# Patient Record
Sex: Female | Born: 1985 | Race: Black or African American | Hispanic: No | Marital: Married | Smoking: Never smoker
Health system: Southern US, Community
[De-identification: ages and names within clinical notes are randomized; demographics above are authoritative.]

## PROBLEM LIST (undated history)

## (undated) ENCOUNTER — Emergency Department (HOSPITAL_COMMUNITY): Admission: EM | Payer: Medicaid Other

## (undated) ENCOUNTER — Ambulatory Visit: Disposition: A | Discharge status: Home / Self Care

## (undated) DIAGNOSIS — I10 Essential (primary) hypertension: Secondary | ICD-10-CM

## (undated) DIAGNOSIS — G4730 Sleep apnea, unspecified: Secondary | ICD-10-CM

## (undated) DIAGNOSIS — E119 Type 2 diabetes mellitus without complications: Secondary | ICD-10-CM

## (undated) DIAGNOSIS — J45909 Unspecified asthma, uncomplicated: Secondary | ICD-10-CM

## (undated) DIAGNOSIS — D219 Benign neoplasm of connective and other soft tissue, unspecified: Secondary | ICD-10-CM

## (undated) DIAGNOSIS — Z5189 Encounter for other specified aftercare: Secondary | ICD-10-CM

## (undated) DIAGNOSIS — D509 Iron deficiency anemia, unspecified: Secondary | ICD-10-CM

## (undated) HISTORY — DX: Benign neoplasm of connective and other soft tissue, unspecified: D21.9

## (undated) HISTORY — PX: GALLBLADDER SURGERY: SHX652

## (undated) HISTORY — PX: CHOLECYSTECTOMY: SHX55

## (undated) HISTORY — PX: ABDOMINAL HYSTERECTOMY: SHX81

## (undated) HISTORY — DX: Sleep apnea, unspecified: G47.30

## (undated) HISTORY — DX: Unspecified asthma, uncomplicated: J45.909

## (undated) HISTORY — DX: Iron deficiency anemia, unspecified: D50.9

---
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
## 2004-04-02 ENCOUNTER — Inpatient Hospital Stay (HOSPITAL_COMMUNITY): Admission: AD | Admit: 2004-04-02 | Discharge: 2004-04-02 | Payer: Self-pay | Admitting: Obstetrics and Gynecology

## 2004-06-05 ENCOUNTER — Ambulatory Visit: Payer: Self-pay | Admitting: Obstetrics & Gynecology

## 2004-06-05 ENCOUNTER — Inpatient Hospital Stay (HOSPITAL_COMMUNITY): Admission: AD | Admit: 2004-06-05 | Discharge: 2004-06-06 | Payer: Self-pay | Admitting: Obstetrics & Gynecology

## 2004-06-25 ENCOUNTER — Ambulatory Visit (HOSPITAL_COMMUNITY): Admission: RE | Admit: 2004-06-25 | Discharge: 2004-06-25 | Payer: Self-pay | Admitting: *Deleted

## 2004-06-30 ENCOUNTER — Inpatient Hospital Stay (HOSPITAL_COMMUNITY): Admission: AD | Admit: 2004-06-30 | Discharge: 2004-06-30 | Payer: Self-pay | Admitting: Obstetrics and Gynecology

## 2004-07-02 ENCOUNTER — Inpatient Hospital Stay (HOSPITAL_COMMUNITY): Admission: RE | Admit: 2004-07-02 | Discharge: 2004-07-02 | Payer: Self-pay | Admitting: *Deleted

## 2004-07-04 ENCOUNTER — Ambulatory Visit: Payer: Self-pay | Admitting: Obstetrics & Gynecology

## 2004-07-04 ENCOUNTER — Inpatient Hospital Stay (HOSPITAL_COMMUNITY): Admission: AD | Admit: 2004-07-04 | Discharge: 2004-07-08 | Payer: Self-pay | Admitting: Obstetrics and Gynecology

## 2004-07-16 ENCOUNTER — Inpatient Hospital Stay (HOSPITAL_COMMUNITY): Admission: AD | Admit: 2004-07-16 | Discharge: 2004-07-16 | Payer: Self-pay | Admitting: *Deleted

## 2004-09-04 ENCOUNTER — Emergency Department (HOSPITAL_COMMUNITY): Admission: EM | Admit: 2004-09-04 | Discharge: 2004-09-04 | Payer: Self-pay | Admitting: *Deleted

## 2004-12-21 ENCOUNTER — Emergency Department (HOSPITAL_COMMUNITY): Admission: EM | Admit: 2004-12-21 | Discharge: 2004-12-21 | Payer: Self-pay | Admitting: Emergency Medicine

## 2005-01-12 ENCOUNTER — Emergency Department (HOSPITAL_COMMUNITY): Admission: EM | Admit: 2005-01-12 | Discharge: 2005-01-12 | Payer: Self-pay | Admitting: Emergency Medicine

## 2005-01-13 ENCOUNTER — Emergency Department (HOSPITAL_COMMUNITY): Admission: EM | Admit: 2005-01-13 | Discharge: 2005-01-13 | Payer: Self-pay | Admitting: Emergency Medicine

## 2010-03-06 ENCOUNTER — Encounter: Payer: Self-pay | Admitting: Emergency Medicine

## 2011-12-23 DIAGNOSIS — I10 Essential (primary) hypertension: Secondary | ICD-10-CM | POA: Insufficient documentation

## 2011-12-23 DIAGNOSIS — R102 Pelvic and perineal pain: Secondary | ICD-10-CM | POA: Insufficient documentation

## 2011-12-23 DIAGNOSIS — E1169 Type 2 diabetes mellitus with other specified complication: Secondary | ICD-10-CM | POA: Insufficient documentation

## 2012-05-11 DIAGNOSIS — F32A Depression, unspecified: Secondary | ICD-10-CM | POA: Insufficient documentation

## 2012-12-17 DIAGNOSIS — Z6841 Body Mass Index (BMI) 40.0 and over, adult: Secondary | ICD-10-CM | POA: Insufficient documentation

## 2012-12-17 DIAGNOSIS — G4733 Obstructive sleep apnea (adult) (pediatric): Secondary | ICD-10-CM | POA: Insufficient documentation

## 2013-05-16 DIAGNOSIS — M5136 Other intervertebral disc degeneration, lumbar region: Secondary | ICD-10-CM | POA: Insufficient documentation

## 2013-05-16 DIAGNOSIS — M51369 Other intervertebral disc degeneration, lumbar region without mention of lumbar back pain or lower extremity pain: Secondary | ICD-10-CM | POA: Insufficient documentation

## 2015-05-13 DIAGNOSIS — D539 Nutritional anemia, unspecified: Secondary | ICD-10-CM | POA: Insufficient documentation

## 2016-06-15 DIAGNOSIS — D259 Leiomyoma of uterus, unspecified: Secondary | ICD-10-CM | POA: Insufficient documentation

## 2017-02-14 DIAGNOSIS — Z5189 Encounter for other specified aftercare: Secondary | ICD-10-CM

## 2017-02-14 HISTORY — DX: Encounter for other specified aftercare: Z51.89

## 2017-09-12 ENCOUNTER — Other Ambulatory Visit: Payer: Self-pay

## 2017-09-12 ENCOUNTER — Emergency Department (HOSPITAL_COMMUNITY)
Admission: EM | Admit: 2017-09-12 | Discharge: 2017-09-12 | Disposition: A | Discharge status: Home / Self Care | Payer: Medicaid Other | Attending: Emergency Medicine | Admitting: Emergency Medicine

## 2017-09-12 ENCOUNTER — Encounter (HOSPITAL_COMMUNITY): Payer: Self-pay | Admitting: Emergency Medicine

## 2017-09-12 ENCOUNTER — Emergency Department (HOSPITAL_COMMUNITY): Payer: Medicaid Other

## 2017-09-12 DIAGNOSIS — R059 Cough, unspecified: Secondary | ICD-10-CM

## 2017-09-12 DIAGNOSIS — I10 Essential (primary) hypertension: Secondary | ICD-10-CM | POA: Insufficient documentation

## 2017-09-12 DIAGNOSIS — R05 Cough: Secondary | ICD-10-CM | POA: Insufficient documentation

## 2017-09-12 DIAGNOSIS — E119 Type 2 diabetes mellitus without complications: Secondary | ICD-10-CM | POA: Insufficient documentation

## 2017-09-12 DIAGNOSIS — R0602 Shortness of breath: Secondary | ICD-10-CM | POA: Insufficient documentation

## 2017-09-12 HISTORY — DX: Essential (primary) hypertension: I10

## 2017-09-12 HISTORY — DX: Type 2 diabetes mellitus without complications: E11.9

## 2017-09-12 MED ORDER — IPRATROPIUM-ALBUTEROL 0.5-2.5 (3) MG/3ML IN SOLN
3.0000 mL | Freq: Once | RESPIRATORY_TRACT | Status: AC
  Administered 2017-09-12: 3 mL via RESPIRATORY_TRACT
  Filled 2017-09-12: qty 3

## 2017-09-12 MED ORDER — BENZONATATE 100 MG PO CAPS: 100.0000 mg | ORAL_CAPSULE | Freq: Three times a day (TID) | ORAL | 0 refills | Status: DC

## 2017-09-12 NOTE — ED Triage Notes (Signed)
Pt states she has been coughing for a week and a half. Started feeling SOB, dizzy. Pt also states she has been tasting blood when she coughs.

## 2017-09-12 NOTE — ED Notes (Signed)
Pt transported to xray

## 2017-09-12 NOTE — ED Notes (Signed)
Pt verbalizes understanding of d/c instructions. Pt received prescriptions. Pt ambulatory at d/c with all belongings and with family.

## 2017-09-12 NOTE — ED Provider Notes (Signed)
[facility] EMERGENCY DEPARTMENT Provider Note   CSN: [id number] Arrival date & time: 09/12/17  1641    History   Chief Complaint Chief Complaint  Patient presents with   Cough   Shortness of Breath    HPI Elaine Perez is a 32 y.o. female.  HPI   32 year old female with a significant past medical history of asthma, diabetes and hypertension presents today with cough and shortness of breath.  Patient notes that over the last week she has had nonproductive cough and minor shortness of breath.  She notes chest tightness worse on the right side.  Patient notes no fever, nausea or vomiting, she is a non-smoker.  No history DVT or PE, no lower extremity swelling or edema or significant risk factors.   Past Medical History:  Diagnosis Date   Diabetes mellitus without complication ([place])    Hypertension     There are no active problems to display for this patient.   History reviewed. No pertinent surgical history.   OB History   None      Home Medications    Prior to Admission medications   Medication Sig Start Date End Date Taking? Authorizing Provider  benzonatate (TESSALON) 100 MG capsule Take 1 capsule (100 mg total) by mouth every 8 (eight) hours. 09/12/17   Okey Regal, PA-C    Family History History reviewed. No pertinent family history.  Social History Social History   Tobacco Use   Smoking status: Never Smoker   Smokeless tobacco: Never Used  Substance Use Topics   Alcohol use: Never    Frequency: Never   Drug use: Never     Allergies   Morphine and related   Review of Systems Review of Systems  All other systems reviewed and are negative.    Physical Exam Updated Vital Signs BP (!) 149/96 (BP Location: Left Arm)    Pulse 86    Temp 98.4 F (36.9 C) (Oral)    Resp 16    Ht 5\' 2"  (1.575 m)    Wt 104.8 kg (231 lb)    LMP 08/23/2017    SpO2 98%    BMI 42.25 kg/m   Physical Exam  Constitutional: She is oriented  to person, place, and time. She appears well-developed and well-nourished.  HENT:  Head: Normocephalic and atraumatic.  Eyes: Pupils are equal, round, and reactive to light. Conjunctivae are normal. Right eye exhibits no discharge. Left eye exhibits no discharge. No scleral icterus.  Neck: Normal range of motion. No JVD present. No tracheal deviation present.  Cardiovascular: Normal rate, regular rhythm, normal heart sounds and intact distal pulses.  Pulmonary/Chest: Effort normal and breath sounds normal. No stridor. No respiratory distress. She has no wheezes. She has no rales. She exhibits tenderness.  Minor tenderness to right anterior chest wall  Musculoskeletal:  No swelling or edema  Neurological: She is alert and oriented to person, place, and time. Coordination normal.  Psychiatric: She has a normal mood and affect. Her behavior is normal. Judgment and thought content normal.  Nursing note and vitals reviewed.    ED Treatments / Results  Labs (all labs ordered are listed, but only abnormal results are displayed) Labs Reviewed - No data to display  EKG EKG Interpretation  Date/Time:  Tuesday September 12 2017 16:59:25 EDT Ventricular Rate:  79 PR Interval:  106 QRS Duration: 74 QT Interval:  368 QTC Calculation: 421 R Axis:   37 Text Interpretation:  Sinus rhythm with short PR  Possible Anterior infarct , age undetermined Abnormal ECG No previous ECGs available Confirmed by Fredia Sorrow [phone]) on 09/13/2017 12:16:56 PM   Radiology Dg Chest 2 View  Result Date: 09/12/2017 CLINICAL DATA:  Cough and short of breath EXAM: CHEST - 2 VIEW COMPARISON:  None. FINDINGS: The heart size and mediastinal contours are within normal limits. Both lungs are clear. The visualized skeletal structures are unremarkable. IMPRESSION: No active cardiopulmonary disease. Electronically Signed   By: Franchot Gallo M.D.   On: 09/12/2017 17:46    Procedures Procedures (including critical care  time)  Medications Ordered in ED Medications  ipratropium-albuterol (DUONEB) 0.5-2.5 (3) MG/3ML nebulizer solution 3 mL (3 mLs Nebulization Given 09/12/17 1914)     Initial Impression / Assessment and Plan / ED Course  I have reviewed the triage vital signs and the nursing notes.  Pertinent labs & imaging results that were available during my care of the patient were reviewed by me and considered in my medical decision making (see chart for details).     32 year old female presents today with cough and shortness of breath.  Patient does have a history of asthma, she had a breathing treatment here which improved her symptoms although she remained with clear lung sounds throughout.  She has no signs of infectious etiology including pneumonia on exam, question asthma related symptoms.  Very low suspicion for ACS, PE, or any other acute intrathoracic abnormality.  Patient given outpatient follow-up strict return precautions.  She verbalized understanding and agreement to today's time.  Final Clinical Impressions(s) / ED Diagnoses   Final diagnoses:  Cough    ED Discharge Orders        Ordered    benzonatate (TESSALON) 100 MG capsule  Every 8 hours     09/12/17 2009       Okey Regal, PA-C 09/13/17 2140    Milton Ferguson, MD 09/16/17 [phone]

## 2017-09-12 NOTE — ED Notes (Signed)
Patient C/O her left lower arm being numb

## 2017-09-12 NOTE — Discharge Instructions (Addendum)
Please read attached information. If you experience any new or worsening signs or symptoms please return to the emergency room for evaluation. Please follow-up with your primary care provider or specialist as discussed. Please use medication prescribed only as directed and discontinue taking if you have any concerning signs or symptoms.   °

## 2017-09-12 NOTE — ED Provider Notes (Signed)
Patient placed in Quick Look pathway, seen and evaluated   Chief Complaint: sob, cough  HPI:   Pt states for the past week she has had sob and cough. Cough is nonproductive. She has associated sore throat. She states she occasionally feels hot, but denies reported fevers or chills. No cp. H/o asthma, mild improvement of sob with albuterol. No other medical problems. Takes no other medications. No known sick contacts.   ROS: sob  Physical Exam:   Gen: No distress  Neuro: Awake and Alert  Skin: Warm    Focused Exam: rrr. Lungs with scattered expiratory wheezes. Speaking in full sentences. No respiratory distress.    Initiation of care has begun. The patient has been counseled on the process, plan, and necessity for staying for the completion/evaluation, and the remainder of the medical screening examination    Franchot Heidelberg, PA-C 09/12/17 1711    Hayden Rasmussen, MD 09/13/17 1209

## 2018-03-20 DIAGNOSIS — E119 Type 2 diabetes mellitus without complications: Secondary | ICD-10-CM | POA: Diagnosis not present

## 2018-03-20 DIAGNOSIS — M791 Myalgia, unspecified site: Secondary | ICD-10-CM | POA: Diagnosis present

## 2018-03-20 DIAGNOSIS — J111 Influenza due to unidentified influenza virus with other respiratory manifestations: Secondary | ICD-10-CM | POA: Diagnosis not present

## 2018-03-20 DIAGNOSIS — I10 Essential (primary) hypertension: Secondary | ICD-10-CM | POA: Diagnosis not present

## 2018-03-21 ENCOUNTER — Emergency Department (HOSPITAL_BASED_OUTPATIENT_CLINIC_OR_DEPARTMENT_OTHER)
Admission: EM | Admit: 2018-03-21 | Discharge: 2018-03-21 | Disposition: A | Discharge status: Home / Self Care | Payer: Medicaid Other | Attending: Emergency Medicine | Admitting: Emergency Medicine

## 2018-03-21 ENCOUNTER — Encounter (HOSPITAL_BASED_OUTPATIENT_CLINIC_OR_DEPARTMENT_OTHER): Payer: Self-pay | Admitting: Emergency Medicine

## 2018-03-21 ENCOUNTER — Other Ambulatory Visit: Payer: Self-pay

## 2018-03-21 DIAGNOSIS — J111 Influenza due to unidentified influenza virus with other respiratory manifestations: Secondary | ICD-10-CM

## 2018-03-21 DIAGNOSIS — R69 Illness, unspecified: Secondary | ICD-10-CM

## 2018-03-21 NOTE — ED Triage Notes (Signed)
Pt having cold like symptoms for the past few days with generalized body aches and chills.

## 2018-03-21 NOTE — Discharge Instructions (Addendum)
You may alternate Tylenol 1000 mg every 6 hours as needed for fever and pain and ibuprofen 800 mg every 8 hours as needed for fever and pain. Please rest and drink plenty of fluids. This is a viral illness causing your symptoms. You do not need antibiotics for a virus. You may use over-the-counter nasal saline spray and Afrin nasal saline spray as needed for nasal congestion. Please do not use Afrin for more than 3 days in a row. You may use guaifenesin and dextromethorphan as needed for cough.  You may use lozenges and Chloraseptic spray to help with sore throat.  Warm salt water gargles can also help with sore throat.  You may use over-the-counter Unisom (doxyalamine) or Benadryl to help with sleep.  Please note that some combination medicines such as DayQuil and NyQuil have multiple medications in them.  Please make sure you look at all labels to ensure that you are not taking too much of any one particular medication.  Symptoms from a virus may take 7-14 days to run its course.  We do not test for the flu from the emergency department as it takes hours for this test to come back and it would not change our management. The flu is treated like any other virus with supportive measures as listed above. At this time you are outside the treatment window for Tamiflu. Tamiflu has to be taken within the first 48 hours of symptoms.  Tamiflu has many side effects including nausea, vomiting and diarrhea.   Steps to find a Primary Care Provider (PCP):  Call [phone] or [phone] to access "[facility]."  2.  You may also go on the [facility] website at [email or web address]  3.   and Wellness also frequently accepts new patients.  [place] [phone]  4.  There are also multiple [facility], [facility] and [facility] practices throughout the [place] that  are frequently accepting new patients. You may find a clinic that is close to your home and contact them.  [facility] [email or web address] [phone]  [facility] [email or web address]  [email or web address] [place] [email or web address] [phone]  5.  Local Health Departments also can provide primary care services.  [facility]  [place] [address] [phone]  [facility] [place] [address] [address] [place] [phone]

## 2018-03-21 NOTE — ED Provider Notes (Signed)
TIME SEEN: 2:09 AM  CHIEF COMPLAINT: Flulike illness  HPI: Patient is a 33 year old female with history of hypertension, diabetes, obesity who presents to the emergency department with 4 days of fevers, chills, body aches, nasal congestion, dry cough, sore throat.  No nausea, vomiting or diarrhea.  Did not have a flu shot this year.  No known sick contacts.  No recent travel.  ROS: See HPI Constitutional:  fever  Eyes: no drainage  ENT:  runny nose   Cardiovascular:  no chest pain  Resp: no SOB  GI: no vomiting GU: no dysuria Integumentary: no rash  Allergy: no hives  Musculoskeletal: no leg swelling  Neurological: no slurred speech ROS otherwise negative  PAST MEDICAL HISTORY/PAST SURGICAL HISTORY:  Past Medical History:  Diagnosis Date   Diabetes mellitus without complication ([place])    Hypertension     MEDICATIONS:  Prior to Admission medications   Medication Sig Start Date End Date Taking? Authorizing Provider  benzonatate (TESSALON) 100 MG capsule Take 1 capsule (100 mg total) by mouth every 8 (eight) hours. 09/12/17   Okey Regal, PA-C    ALLERGIES:  Allergies  Allergen Reactions   Morphine And Related     swelling    SOCIAL HISTORY:  Social History   Tobacco Use   Smoking status: Never Smoker   Smokeless tobacco: Never Used  Substance Use Topics   Alcohol use: Never    Frequency: Never    FAMILY HISTORY: No family history on file.  EXAM: BP (!) 157/96 (BP Location: Right Arm)    Pulse 70    Temp 98.4 F (36.9 C) (Oral)    Resp 18    Ht 5\' 1"  (1.549 m)    Wt 103.4 kg    SpO2 100%    BMI 43.08 kg/m  CONSTITUTIONAL: Alert and oriented and responds appropriately to questions. Well-appearing; well-nourished, obese, nontoxic HEAD: Normocephalic EYES: Conjunctivae clear, pupils appear equal, EOMI ENT: normal nose; moist mucous membranes; No pharyngeal erythema or petechiae, no tonsillar hypertrophy or exudate, no uvular deviation, no unilateral  swelling, no trismus or drooling, no muffled voice, patient does have a hoarse voice consistent with laryngitis, no stridor, no dental caries present, no drainable dental abscess noted, no Ludwig's angina, tongue sits flat in the bottom of the mouth, no angioedema, no facial erythema or warmth, no facial swelling; no pain with movement of the neck.  TMs are clear bilaterally without erythema, purulence, bulging, perforation, effusion.  No cerumen impaction or sign of foreign body in the external auditory canal. No inflammation, erythema or drainage from the external auditory canal. No signs of mastoiditis. No pain with manipulation of the pinna bilaterally.  NECK: Supple, no meningismus, no nuchal rigidity, no LAD  CARD: RRR; S1 and S2 appreciated; no murmurs, no clicks, no rubs, no gallops RESP: Normal chest excursion without splinting or tachypnea; breath sounds clear and equal bilaterally; no wheezes, no rhonchi, no rales, no hypoxia or respiratory distress, speaking full sentences ABD/GI: Normal bowel sounds; non-distended; soft, non-tender, no rebound, no guarding, no peritoneal signs, no hepatosplenomegaly BACK:  The back appears normal and is non-tender to palpation, there is no CVA tenderness EXT: Normal ROM in all joints; non-tender to palpation; no edema; normal capillary refill; no cyanosis, no calf tenderness or swelling    SKIN: Normal color for age and race; warm; no rash NEURO: Moves all extremities equally PSYCH: The patient's mood and manner are appropriate. Grooming and personal hygiene are appropriate.  MEDICAL DECISION MAKING:  Patient here with flulike symptoms.  Outside treatment window for Tamiflu.  Discussed supportive care instructions with over-the-counter medications, rest, increase fluid intake.  Will provide with work note.  She does not appear septic, toxic today.  Doubt meningitis, pneumonia, bacteremia.  Patient verbalized understanding.  At this time, I do not feel there  is any life-threatening condition present. I have reviewed and discussed all results (EKG, imaging, lab, urine as appropriate) and exam findings with patient/family. I have reviewed nursing notes and appropriate previous records.  I feel the patient is safe to be discharged home without further emergent workup and can continue workup as an outpatient as needed. Discussed usual and customary return precautions. Patient/family verbalize understanding and are comfortable with this plan.  Outpatient follow-up has been provided as needed. All questions have been answered.      Romilda Proby, Delice Bison, DO 03/21/18 [phone]

## 2018-03-21 NOTE — ED Notes (Signed)
ED Provider at bedside. °

## 2018-04-12 ENCOUNTER — Encounter (HOSPITAL_COMMUNITY): Payer: Self-pay | Admitting: Emergency Medicine

## 2018-04-12 ENCOUNTER — Other Ambulatory Visit: Payer: Self-pay

## 2018-04-12 ENCOUNTER — Emergency Department (HOSPITAL_COMMUNITY)
Admission: EM | Admit: 2018-04-12 | Discharge: 2018-04-12 | Disposition: A | Discharge status: Home / Self Care | Payer: Medicaid Other | Attending: Emergency Medicine | Admitting: Emergency Medicine

## 2018-04-12 DIAGNOSIS — D649 Anemia, unspecified: Secondary | ICD-10-CM

## 2018-04-12 DIAGNOSIS — R5383 Other fatigue: Secondary | ICD-10-CM | POA: Diagnosis not present

## 2018-04-12 DIAGNOSIS — I10 Essential (primary) hypertension: Secondary | ICD-10-CM | POA: Insufficient documentation

## 2018-04-12 DIAGNOSIS — R0602 Shortness of breath: Secondary | ICD-10-CM | POA: Diagnosis not present

## 2018-04-12 DIAGNOSIS — E119 Type 2 diabetes mellitus without complications: Secondary | ICD-10-CM | POA: Insufficient documentation

## 2018-04-12 LAB — ABO/RH: ABO/RH(D): B POS

## 2018-04-12 LAB — CBC WITH DIFFERENTIAL/PLATELET
Abs Immature Granulocytes: 0.02 10*3/uL (ref 0.00–0.07)
Basophils Absolute: 0.1 10*3/uL (ref 0.0–0.1)
Basophils Relative: 1 %
Eosinophils Absolute: 0.1 10*3/uL (ref 0.0–0.5)
Eosinophils Relative: 1 %
HCT: 29.3 % — ABNORMAL LOW (ref 36.0–46.0)
Hemoglobin: 7.4 g/dL — ABNORMAL LOW (ref 12.0–15.0)
Immature Granulocytes: 0 %
Lymphocytes Relative: 36 %
Lymphs Abs: 2.9 10*3/uL (ref 0.7–4.0)
MCH: 17.5 pg — ABNORMAL LOW (ref 26.0–34.0)
MCHC: 25.3 g/dL — ABNORMAL LOW (ref 30.0–36.0)
MCV: 69.1 fL — ABNORMAL LOW (ref 80.0–100.0)
Monocytes Absolute: 0.7 10*3/uL (ref 0.1–1.0)
Monocytes Relative: 8 %
Neutro Abs: 4.3 10*3/uL (ref 1.7–7.7)
Neutrophils Relative %: 54 %
Platelets: 542 10*3/uL — ABNORMAL HIGH (ref 150–400)
RBC: 4.24 MIL/uL (ref 3.87–5.11)
RDW: 19.5 % — ABNORMAL HIGH (ref 11.5–15.5)
WBC: 8 10*3/uL (ref 4.0–10.5)
nRBC: 0 % (ref 0.0–0.2)

## 2018-04-12 LAB — BASIC METABOLIC PANEL
Anion gap: 7 (ref 5–15)
BUN: 14 mg/dL (ref 6–20)
CO2: 23 mmol/L (ref 22–32)
Calcium: 9 mg/dL (ref 8.9–10.3)
Chloride: 109 mmol/L (ref 98–111)
Creatinine, Ser: 0.91 mg/dL (ref 0.44–1.00)
GFR calc Af Amer: >60 mL/min (ref >60)
GFR calc non Af Amer: >60 mL/min (ref >60)
Glucose, Bld: 99 mg/dL (ref 70–99)
Potassium: 4.2 mmol/L (ref 3.5–5.1)
Sodium: 139 mmol/L (ref 135–145)

## 2018-04-12 LAB — PREPARE RBC (CROSSMATCH)

## 2018-04-12 MED ORDER — SODIUM CHLORIDE 0.9% IV SOLUTION
Freq: Once | INTRAVENOUS | Status: AC
  Administered 2018-04-12: 10 mL/h via INTRAVENOUS

## 2018-04-12 NOTE — ED Notes (Signed)
Unable to obtain Iv access after 2 different RN's attempted , IV team consult placed

## 2018-04-12 NOTE — ED Notes (Signed)
Walked patient to the bathroom

## 2018-04-12 NOTE — ED Triage Notes (Signed)
Pt was told to come to the Ed for possible blood transfusion due to a low hgb , his of same

## 2018-04-12 NOTE — ED Provider Notes (Signed)
[facility] EMERGENCY DEPARTMENT Provider Note   CSN: [id number] Arrival date & time: 04/12/18  0846    History   Chief Complaint Chief Complaint  Patient presents with  . Abnormal Lab    HPI Elaine Perez is a 33 y.o. female.     HPI   32yF with anemia. Hx of the same. Called and told that was increasingly anemic and told that needed evaluated and possible transfusion. She reports hx of heavy periods. No acute change in this. Feels tired and SOB with exertion. Mild lightheadedness recently. No BRBPR or melena.   Past Medical History:  Diagnosis Date  . Diabetes mellitus without complication ([facility])   . Hypertension     There are no active problems to display for this patient.   No past surgical history on file.   OB History   No obstetric history on file.      Home Medications    Prior to Admission medications   Medication Sig Start Date End Date Taking? Authorizing Provider  benzonatate (TESSALON) 100 MG capsule Take 1 capsule (100 mg total) by mouth every 8 (eight) hours. 09/12/17   Eyvonne Mechanic, PA-C    Family History No family history on file.  Social History Social History   Tobacco Use  . Smoking status: Never Smoker  . Smokeless tobacco: Never Used  Substance Use Topics  . Alcohol use: Never    Frequency: Never  . Drug use: Never     Allergies   Morphine and related   Review of Systems Review of Systems All systems reviewed and negative, other than as noted in HPI.   Physical Exam Updated Vital Signs BP (!) 160/98 (BP Location: Right Arm) Comment: Simultaneous filing. User may not have seen previous data.  Pulse 94 Comment: Simultaneous filing. User may not have seen previous data.  Temp 98.5 F (36.9 C) (Oral) Comment: Simultaneous filing. User may not have seen previous data. Comment (Src): Simultaneous filing. User may not have seen previous data.  Resp 20 Comment: Simultaneous filing. User may not have seen  previous data.  SpO2 100% Comment: Simultaneous filing. User may not have seen previous data.  Physical Exam Vitals signs and nursing note reviewed.  Constitutional:      General: She is not in acute distress.    Appearance: She is well-developed. She is obese.  HENT:     Head: Normocephalic and atraumatic.  Eyes:     General:        Right eye: No discharge.        Left eye: No discharge.     Conjunctiva/sclera: Conjunctivae normal.  Neck:     Musculoskeletal: Neck supple.  Cardiovascular:     Rate and Rhythm: Normal rate and regular rhythm.     Heart sounds: Normal heart sounds. No murmur. No friction rub. No gallop.   Pulmonary:     Effort: Pulmonary effort is normal. No respiratory distress.     Breath sounds: Normal breath sounds.  Abdominal:     General: There is no distension.     Palpations: Abdomen is soft.     Tenderness: There is no abdominal tenderness.  Musculoskeletal:        General: No tenderness.  Skin:    General: Skin is warm and dry.  Neurological:     Mental Status: She is alert.  Psychiatric:        Behavior: Behavior normal.        Thought Content: Thought  content normal.      ED Treatments / Results  Labs (all labs ordered are listed, but only abnormal results are displayed) Labs Reviewed  CBC WITH DIFFERENTIAL/PLATELET - Abnormal; Notable for the following components:      Result Value   Hemoglobin 7.4 (*)    HCT 29.3 (*)    MCV 69.1 (*)    MCH 17.5 (*)    MCHC 25.3 (*)    RDW 19.5 (*)    Platelets 542 (*)    All other components within normal limits  BASIC METABOLIC PANEL  TYPE AND SCREEN  PREPARE RBC (CROSSMATCH)  ABO/RH    EKG None  Radiology No results found.  Procedures Procedures (including critical care time)  CRITICAL CARE Performed by: Raeford Razor Total critical care time: 35 minutes Critical care time was exclusive of separately billable procedures and treating other patients. Critical care was necessary to  treat or prevent imminent or life-threatening deterioration. Critical care was time spent personally by me on the following activities: development of treatment plan with patient and/or surrogate as well as nursing, discussions with consultants, evaluation of patient's response to treatment, examination of patient, obtaining history from patient or surrogate, ordering and performing treatments and interventions, ordering and review of laboratory studies, ordering and review of radiographic studies, pulse oximetry and re-evaluation of patient's condition.   Medications Ordered in ED Medications  0.9 %  sodium chloride infusion (Manually program via Guardrails IV Fluids) ( Intravenous Stopped 04/12/18 1859)     Initial Impression / Assessment and Plan / ED Course  I have reviewed the triage vital signs and the nursing notes.  Pertinent labs & imaging results that were available during my care of the patient were reviewed by me and considered in my medical decision making (see chart for details).        Symptomatic anemia. Hx of the same. Transfused 1u PRBC. Has established outpt care. Discussed return precautions.   Final Clinical Impressions(s) / ED Diagnoses   Final diagnoses:  Symptomatic anemia    ED Discharge Orders    None       Raeford Razor, MD 04/18/18 1039

## 2018-04-12 NOTE — ED Notes (Signed)
Patient verbalizes understanding of discharge instructions. Opportunity for questioning and answers were provided. Armband removed by staff, pt discharged from ED

## 2018-04-13 LAB — TYPE AND SCREEN
ABO/RH(D): B POS
Antibody Screen: NEGATIVE
Unit division: 0
Unit division: 0

## 2018-04-13 LAB — BPAM RBC
Blood Product Expiration Date: 202003062359
Blood Product Expiration Date: 202003242359
ISSUE DATE / TIME: 202002271155
ISSUE DATE / TIME: 202002271555
Unit Type and Rh: 1700
Unit Type and Rh: 1700

## 2018-04-17 ENCOUNTER — Telehealth: Payer: Self-pay | Admitting: Internal Medicine

## 2018-04-17 ENCOUNTER — Encounter: Payer: Self-pay | Admitting: Internal Medicine

## 2018-04-17 NOTE — Telephone Encounter (Signed)
A new hem appt has been scheduled for the pt to see Dr. Walden Field on 3/13 at 850am. Pt aware to arrive 30 minutes early. Letter mailed.

## 2018-04-27 ENCOUNTER — Telehealth: Payer: Self-pay | Admitting: *Deleted

## 2018-04-27 ENCOUNTER — Telehealth: Payer: Self-pay | Admitting: Internal Medicine

## 2018-04-27 ENCOUNTER — Encounter: Payer: Self-pay | Admitting: Internal Medicine

## 2018-04-27 ENCOUNTER — Inpatient Hospital Stay: Payer: Medicaid Other | Attending: Internal Medicine | Admitting: Internal Medicine

## 2018-04-27 ENCOUNTER — Other Ambulatory Visit: Payer: Self-pay

## 2018-04-27 ENCOUNTER — Inpatient Hospital Stay: Payer: Medicaid Other

## 2018-04-27 VITALS — BP 137/93 | HR 75 | Temp 98.9°F | Resp 20 | Ht 61.0 in | Wt 231.5 lb

## 2018-04-27 DIAGNOSIS — D509 Iron deficiency anemia, unspecified: Secondary | ICD-10-CM | POA: Insufficient documentation

## 2018-04-27 DIAGNOSIS — G629 Polyneuropathy, unspecified: Secondary | ICD-10-CM | POA: Insufficient documentation

## 2018-04-27 DIAGNOSIS — D500 Iron deficiency anemia secondary to blood loss (chronic): Secondary | ICD-10-CM

## 2018-04-27 DIAGNOSIS — D649 Anemia, unspecified: Secondary | ICD-10-CM

## 2018-04-27 DIAGNOSIS — I10 Essential (primary) hypertension: Secondary | ICD-10-CM | POA: Insufficient documentation

## 2018-04-27 DIAGNOSIS — D508 Other iron deficiency anemias: Secondary | ICD-10-CM

## 2018-04-27 DIAGNOSIS — N920 Excessive and frequent menstruation with regular cycle: Secondary | ICD-10-CM

## 2018-04-27 DIAGNOSIS — R5383 Other fatigue: Secondary | ICD-10-CM

## 2018-04-27 DIAGNOSIS — Z803 Family history of malignant neoplasm of breast: Secondary | ICD-10-CM

## 2018-04-27 HISTORY — DX: Iron deficiency anemia, unspecified: D50.9

## 2018-04-27 LAB — COMPREHENSIVE METABOLIC PANEL
ALT: 14 U/L (ref 0–44)
ANION GAP: 7 (ref 5–15)
AST: 14 U/L — ABNORMAL LOW (ref 15–41)
Albumin: 3.3 g/dL — ABNORMAL LOW (ref 3.5–5.0)
Alkaline Phosphatase: 64 U/L (ref 38–126)
BUN: 11 mg/dL (ref 6–20)
CO2: 24 mmol/L (ref 22–32)
Calcium: 8.9 mg/dL (ref 8.9–10.3)
Chloride: 107 mmol/L (ref 98–111)
Creatinine, Ser: 0.8 mg/dL (ref 0.44–1.00)
GFR calc Af Amer: >60 mL/min (ref >60)
GFR calc non Af Amer: >60 mL/min (ref >60)
GLUCOSE: 93 mg/dL (ref 70–99)
Potassium: 3.9 mmol/L (ref 3.5–5.1)
Sodium: 138 mmol/L (ref 135–145)
TOTAL PROTEIN: 7 g/dL (ref 6.5–8.1)

## 2018-04-27 LAB — CBC WITH DIFFERENTIAL/PLATELET
Abs Immature Granulocytes: 0.03 10*3/uL (ref 0.00–0.07)
Basophils Absolute: 0.1 10*3/uL (ref 0.0–0.1)
Basophils Relative: 1 %
Eosinophils Absolute: 0.2 10*3/uL (ref 0.0–0.5)
Eosinophils Relative: 2 %
HCT: 34.2 % — ABNORMAL LOW (ref 36.0–46.0)
Hemoglobin: 9.1 g/dL — ABNORMAL LOW (ref 12.0–15.0)
Immature Granulocytes: 0 %
Lymphocytes Relative: 35 %
Lymphs Abs: 2.9 10*3/uL (ref 0.7–4.0)
MCH: 19.7 pg — ABNORMAL LOW (ref 26.0–34.0)
MCHC: 26.6 g/dL — ABNORMAL LOW (ref 30.0–36.0)
MCV: 74 fL — AB (ref 80.0–100.0)
MONO ABS: 0.7 10*3/uL (ref 0.1–1.0)
Monocytes Relative: 9 %
Neutro Abs: 4.5 10*3/uL (ref 1.7–7.7)
Neutrophils Relative %: 53 %
PLATELETS: 459 10*3/uL — AB (ref 150–400)
RBC: 4.62 MIL/uL (ref 3.87–5.11)
RDW: 22.9 % — ABNORMAL HIGH (ref 11.5–15.5)
WBC: 8.4 10*3/uL (ref 4.0–10.5)
nRBC: 0 % (ref 0.0–0.2)

## 2018-04-27 LAB — VITAMIN B12: Vitamin B-12: 232 pg/mL (ref 180–914)

## 2018-04-27 LAB — LACTATE DEHYDROGENASE: LDH: 171 U/L (ref 98–192)

## 2018-04-27 LAB — FOLATE: Folate: 12 ng/mL (ref >5.9)

## 2018-04-27 LAB — FERRITIN: Ferritin: <4 ng/mL — ABNORMAL LOW (ref 11–307)

## 2018-04-27 NOTE — Telephone Encounter (Signed)
-----   Message from Zoila Shutter, MD sent at 04/27/2018 12:54 PM EDT ----- Notify pt iron levels are low and set up for IV iron.

## 2018-04-27 NOTE — Progress Notes (Signed)
Referring Physician:  Dallas Breeding, NP  [place]   Diagnosis Other iron deficiency anemia - Plan: CBC with Differential/Platelet, Comprehensive metabolic panel, Ferritin, Lactate dehydrogenase, Protein electrophoresis, serum, Hemoglobinopathy evaluation, Vitamin B12, Folate, Methylmalonic acid, serum, Haptoglobin, T4 AND TSH  Fatigue associated with anemia - Plan: CBC with Differential/Platelet, Comprehensive metabolic panel, Ferritin, Lactate dehydrogenase, Protein electrophoresis, serum, Hemoglobinopathy evaluation, Vitamin B12, Folate, Methylmalonic acid, serum, Haptoglobin, T4 AND TSH  Staging Cancer Staging No matching staging information was found for the patient.  Assessment and Plan:  1.  Iron deficiency anemia (IDA).   33 year old female referred for evaluation due to persistent iron deficiency anemia.  Pt has an intolerance for oral iron.  She has been told she has uterine fibroids.  She has a family history of various cancers.  She reports her menstrual cycles occur 2 times a month.  Labs done 04/12/2018 showed WBC 8 HB 7.4 plts 542,000.  MCV 69.  Chemistries WNL with K+ 4.2 Cr 0.9.  Pt was transfused on 04/12/2018.  She reports craving ice.  She reports fatigue and numbness in hands.  Pt is seen today for consultation due to Iron deficiency anemia.    Labs done 04/27/2018 reviewed and showed WBC 8.4 HB 9.1 plts 459,000.  MCV 74.  Chemistries WNL with K+ 3.9 Cr 0.80 and normal LFTs.  Ferritin < 4.  Folate 12 and B12 232.    Pt has an intolerance to oral iron.  She is recommended for Feraheme 510 mg IV D1 and D8. Side effects of medication reviewed and include potential allergic reaction.  Pt will be set up for IV iron and will RTC 6 weeks after IV treatment for repeat labs and follow-up.    2.  Menorrhagia.  Likely due to uterine fibroids.  Pt is encouraged to follow-up with GYN.  Likely etiology of IDA.    3.  Hypertension.  BP is 137/93.  Follow-up with PCP.    4.   Fatigue.  Likely due to IDA.  Will check thyroid function tests and determine if symptoms improve with therapy.    5.  Neuropathy.  Pt has reported DM.  Will determine if symptoms improve with IV iron.    6.  Health maintenance.  Pt will be referred to GI on RTC in 05/2018.    40 minutes spent with more than 50% spent in review of records, counseling and coordination of care.     HPI:  33 year old female referred for evaluation due to persistent iron deficiency anemia.  Pt was seen at [facility] and was treated in the past with IV iron. Pt has an intolerance for oral iron.  She has been told she has uterine fibroids.  She has a family history of various cancers.  She reports her menstrual cycles occur 2 times a month.  Labs done 04/12/2018 showed WBC 8 HB 7.4 plts 542,000.  MCV 69.  Chemistries WNL with K+ 4.2 Cr 0.9.  Pt was transfused on 04/12/2018.  She reports craving ice.  She reports fatigue and numbness in hands.  Pt is seen today for consultation due to Iron deficiency anemia.    Problem List Iron deficiency anemia  Past Medical History Past Medical History:  Diagnosis Date   Diabetes mellitus without complication ([place])    Fibroids    Hypertension     Past Surgical History Past Surgical History:  Procedure Laterality Date   GALLBLADDER SURGERY  REPEAT CESAREAN SECTION      Family History Family History  Problem Relation Age of Onset   Breast cancer Other    Lupus Other      Social History  reports that she has never smoked. She has never used smokeless tobacco. She reports that she does not drink alcohol or use drugs.  Medications  Current Outpatient Medications:    albuterol (VENTOLIN HFA) 108 (90 Base) MCG/ACT inhaler, Inhale 2 puffs into the lungs as needed., Disp: , Rfl:    benzonatate (TESSALON) 100 MG capsule, Take 1 capsule (100 mg total) by mouth every 8 (eight) hours. (Patient not taking: Reported on 04/12/2018), Disp: 21 capsule,  Rfl: 0  Allergies Morphine and related  Review of Systems Review of Systems - Oncology ROS negative other than fatigue and neuropathy   Physical Exam  Vitals Wt Readings from Last 3 Encounters:  04/27/18 231 lb 8 oz (105 kg)  04/12/18 230 lb (104.3 kg)  03/21/18 228 lb (103.4 kg)   Temp Readings from Last 3 Encounters:  04/27/18 98.9 F (37.2 C) (Oral)  04/12/18 99 F (37.2 C) (Oral)  03/21/18 98.4 F (36.9 C) (Oral)   BP Readings from Last 3 Encounters:  04/27/18 (!) 137/93  04/12/18 133/88  03/21/18 (!) 157/96   Pulse Readings from Last 3 Encounters:  04/27/18 75  04/12/18 71  03/21/18 70   Constitutional: Well-developed, well-nourished, and in no distress.   HENT: Head: Normocephalic and atraumatic.  Mouth/Throat: No oropharyngeal exudate. Mucosa moist. Eyes: Pupils are equal, round, and reactive to light. Conjunctivae are normal. No scleral icterus.  Neck: Normal range of motion. Neck supple. No JVD present.  Cardiovascular: Normal rate, regular rhythm and normal heart sounds.  Exam reveals no gallop and no friction rub.   No murmur heard. Pulmonary/Chest: Effort normal and breath sounds normal. No respiratory distress. No wheezes.No rales.  Abdominal: Soft. Bowel sounds are normal. No distension. There is no tenderness. There is no guarding.  Musculoskeletal: No edema or tenderness.  Lymphadenopathy: No cervical,axillary or supraclavicular adenopathy.  Neurological: Alert and oriented to person, place, and time. No cranial nerve deficit.  Skin: Skin is warm and dry. No rash noted. No erythema. No pallor.  Psychiatric: Affect and judgment normal.   Labs Appointment on 04/27/2018  Component Date Value Ref Range Status   Folate 04/27/2018 12.0  >5.9 ng/mL Final   Performed at [facility], [place] [address], [place], [place] [address]   Vitamin B-12 04/27/2018 232  180 - 914 pg/mL Final   Comment: (NOTE) This assay is not validated for  testing neonatal or myeloproliferative syndrome specimens for Vitamin B12 levels. Performed at [facility], [place] [address], [place], [place] [address]    LDH 04/27/2018 171  98 - 192 U/L Final   Performed at [facility], [place] [address], [place], [place] [address]   Ferritin 04/27/2018 <4* 11 - 307 ng/mL Final   Performed at [facility], [place] [address], [place], [place] [address]   Sodium 04/27/2018 138  135 - 145 mmol/L Final   Potassium 04/27/2018 3.9  3.5 - 5.1 mmol/L Final   Chloride 04/27/2018 107  98 - 111 mmol/L Final   CO2 04/27/2018 24  22 - 32 mmol/L Final   Glucose, Bld 04/27/2018 93  70 - 99 mg/dL Final   BUN 04/27/2018 11  6 - 20 mg/dL Final   Creatinine, Ser 04/27/2018 0.80  0.44 -  1.00 mg/dL Final   Calcium 04/27/2018 8.9  8.9 - 10.3 mg/dL Final   Total Protein 04/27/2018 7.0  6.5 - 8.1 g/dL Final   Albumin 04/27/2018 3.3* 3.5 - 5.0 g/dL Final   AST 04/27/2018 14* 15 - 41 U/L Final   ALT 04/27/2018 14  0 - 44 U/L Final   Alkaline Phosphatase 04/27/2018 64  38 - 126 U/L Final   Total Bilirubin 04/27/2018 <0.2* 0.3 - 1.2 mg/dL Final   GFR calc non Af Amer 04/27/2018 >60  >60 mL/min Final   GFR calc Af Amer 04/27/2018 >60  >60 mL/min Final   Anion gap 04/27/2018 7  5 - 15 Final   Performed at [facility], [place] [address], [place], [place] [address]   WBC 04/27/2018 8.4  4.0 - 10.5 K/uL Final   RBC 04/27/2018 4.62  3.87 - 5.11 MIL/uL Final   Hemoglobin 04/27/2018 9.1* 12.0 - 15.0 g/dL Final   Comment: Reticulocyte Hemoglobin testing may be clinically indicated, consider ordering this additional test [id number]    HCT 04/27/2018 34.2* 36.0 - 46.0 % Final   MCV 04/27/2018 74.0* 80.0 - 100.0 fL Final   MCH 04/27/2018 19.7* 26.0 - 34.0 pg Final   MCHC 04/27/2018 26.6* 30.0 - 36.0 g/dL Final   RDW 04/27/2018 22.9* 11.5 - 15.5 % Final   Platelets  04/27/2018 459* 150 - 400 K/uL Final   nRBC 04/27/2018 0.0  0.0 - 0.2 % Final   Neutrophils Relative % 04/27/2018 53  % Final   Neutro Abs 04/27/2018 4.5  1.7 - 7.7 K/uL Final   Lymphocytes Relative 04/27/2018 35  % Final   Lymphs Abs 04/27/2018 2.9  0.7 - 4.0 K/uL Final   Monocytes Relative 04/27/2018 9  % Final   Monocytes Absolute 04/27/2018 0.7  0.1 - 1.0 K/uL Final   Eosinophils Relative 04/27/2018 2  % Final   Eosinophils Absolute 04/27/2018 0.2  0.0 - 0.5 K/uL Final   Basophils Relative 04/27/2018 1  % Final   Basophils Absolute 04/27/2018 0.1  0.0 - 0.1 K/uL Final   Immature Granulocytes 04/27/2018 0  % Final   Abs Immature Granulocytes 04/27/2018 0.03  0.00 - 0.07 K/uL Final   Performed at [facility], [place] [address] [place], [place] [address]     Pathology Orders Placed This Encounter  Procedures   CBC with Differential/Platelet    Standing Status:   Future    Number of Occurrences:   1    Standing Expiration Date:   04/27/2019   Comprehensive metabolic panel    Standing Status:   Future    Number of Occurrences:   1    Standing Expiration Date:   04/27/2019   Ferritin    Standing Status:   Future    Number of Occurrences:   1    Standing Expiration Date:   04/27/2019   Lactate dehydrogenase    Standing Status:   Future    Number of Occurrences:   1    Standing Expiration Date:   04/27/2019   Protein electrophoresis, serum    Standing Status:   Future    Number of Occurrences:   1    Standing Expiration Date:   04/27/2019   Hemoglobinopathy evaluation    Standing Status:   Future    Number of Occurrences:   1    Standing Expiration Date:   04/27/2019   Vitamin B12    Standing Status:  Future    Number of Occurrences:   1    Standing Expiration Date:   04/27/2019   Folate    Standing Status:   Future    Number of Occurrences:   1    Standing Expiration Date:   04/27/2019   Methylmalonic acid, serum    Standing  Status:   Future    Number of Occurrences:   1    Standing Expiration Date:   04/27/2019   Haptoglobin    Standing Status:   Future    Number of Occurrences:   1    Standing Expiration Date:   04/27/2019   T4 AND TSH    Standing Status:   Future    Number of Occurrences:   1    Standing Expiration Date:   04/27/2019       Zoila Shutter MD

## 2018-04-27 NOTE — Telephone Encounter (Signed)
Scheduled appt per 3/13 los. ° °Printed calendar and avs. °

## 2018-04-27 NOTE — Telephone Encounter (Signed)
TCT patient regarding lab results. Pt's ferritin is low and will need IV iron x 2 per Dr. Walden Field. Spoke with pt and made her aware of this. Advised that a scheduler will be calling to set up those infusions.  Pt voiced understanding.

## 2018-04-27 NOTE — Telephone Encounter (Signed)
Scheduled appt per 3/13 sch message - pt aware of appt date and time

## 2018-04-28 LAB — HAPTOGLOBIN: Haptoglobin: 148 mg/dL (ref 33–278)

## 2018-04-30 LAB — PROTEIN ELECTROPHORESIS, SERUM
A/G Ratio: 1.1 (ref 0.7–1.7)
Albumin ELP: 3.4 g/dL (ref 2.9–4.4)
Alpha-1-Globulin: 0.2 g/dL (ref 0.0–0.4)
Alpha-2-Globulin: 0.7 g/dL (ref 0.4–1.0)
Beta Globulin: 0.9 g/dL (ref 0.7–1.3)
Gamma Globulin: 1.3 g/dL (ref 0.4–1.8)
Globulin, Total: 3.1 g/dL (ref 2.2–3.9)
Total Protein ELP: 6.5 g/dL (ref 6.0–8.5)

## 2018-04-30 LAB — METHYLMALONIC ACID, SERUM: Methylmalonic Acid, Quantitative: 131 nmol/L (ref 0–378)

## 2018-05-01 LAB — HEMOGLOBINOPATHY EVALUATION
Hgb A2 Quant: 1.9 % (ref 1.8–3.2)
Hgb A: 98.1 % (ref 96.4–98.8)
Hgb C: 0 %
Hgb F Quant: 0 % (ref 0.0–2.0)
Hgb S Quant: 0 %
Hgb Variant: 0 %

## 2018-05-04 ENCOUNTER — Other Ambulatory Visit: Payer: Self-pay

## 2018-05-04 ENCOUNTER — Inpatient Hospital Stay: Payer: Medicaid Other

## 2018-05-04 VITALS — BP 135/1 | HR 78 | Temp 99.3°F | Resp 16 | Ht 61.0 in | Wt 231.0 lb

## 2018-05-04 DIAGNOSIS — D509 Iron deficiency anemia, unspecified: Secondary | ICD-10-CM | POA: Diagnosis not present

## 2018-05-04 DIAGNOSIS — D500 Iron deficiency anemia secondary to blood loss (chronic): Secondary | ICD-10-CM

## 2018-05-04 MED ORDER — SODIUM CHLORIDE 0.9 % IV SOLN
Freq: Once | INTRAVENOUS | Status: AC
  Administered 2018-05-04: 09:00:00 via INTRAVENOUS
  Filled 2018-05-04: qty 250

## 2018-05-04 MED ORDER — SODIUM CHLORIDE 0.9 % IV SOLN
510.0000 mg | Freq: Once | INTRAVENOUS | Status: AC
  Administered 2018-05-04: 510 mg via INTRAVENOUS
  Filled 2018-05-04: qty 17

## 2018-05-04 NOTE — Patient Instructions (Signed)

## 2018-05-11 ENCOUNTER — Ambulatory Visit: Payer: Medicaid Other

## 2018-05-23 ENCOUNTER — Telehealth: Payer: Self-pay | Admitting: Internal Medicine

## 2018-05-23 NOTE — Telephone Encounter (Signed)
Confirmed 4/13 appointment with patient. Rescheduled from 3/27 per patient.

## 2018-05-28 ENCOUNTER — Telehealth: Payer: Self-pay | Admitting: *Deleted

## 2018-05-28 ENCOUNTER — Inpatient Hospital Stay: Payer: Medicaid Other

## 2018-05-28 NOTE — Telephone Encounter (Signed)
-----   Message from [facility] sent at 05/28/2018 12:35 PM EDT ----- Regarding: Elaine Perez Perez called and rescheduled her iron infusion from today to this Friday - she had some questions regarding her side effects after getting in the infusion.   Can you please give her a call? Thanks (:

## 2018-05-28 NOTE — Telephone Encounter (Signed)
Attempted to call patient back regarding potential side effects from IV iron infusion. Pt last had IV iron on3/20/20 and is scheduled again for 06/01/18. No answer but was able to leave vm message for pt to call back @ her convenience before 4:30 pm

## 2018-06-01 ENCOUNTER — Other Ambulatory Visit: Payer: Self-pay

## 2018-06-01 ENCOUNTER — Inpatient Hospital Stay: Payer: Medicaid Other | Attending: Internal Medicine

## 2018-06-01 VITALS — BP 148/92 | HR 67 | Temp 97.8°F | Resp 16

## 2018-06-01 DIAGNOSIS — D509 Iron deficiency anemia, unspecified: Secondary | ICD-10-CM | POA: Insufficient documentation

## 2018-06-01 DIAGNOSIS — D500 Iron deficiency anemia secondary to blood loss (chronic): Secondary | ICD-10-CM

## 2018-06-01 MED ORDER — SODIUM CHLORIDE 0.9 % IV SOLN
510.0000 mg | Freq: Once | INTRAVENOUS | Status: AC
  Administered 2018-06-01: 510 mg via INTRAVENOUS
  Filled 2018-06-01: qty 17

## 2018-06-01 MED ORDER — SODIUM CHLORIDE 0.9 % IV SOLN
Freq: Once | INTRAVENOUS | Status: AC
  Administered 2018-06-01: 09:00:00 via INTRAVENOUS
  Filled 2018-06-01: qty 250

## 2018-06-01 NOTE — Patient Instructions (Signed)

## 2018-06-14 ENCOUNTER — Inpatient Hospital Stay: Payer: Medicaid Other

## 2018-06-14 ENCOUNTER — Other Ambulatory Visit: Payer: Self-pay

## 2018-06-14 DIAGNOSIS — D508 Other iron deficiency anemias: Secondary | ICD-10-CM

## 2018-06-14 DIAGNOSIS — D509 Iron deficiency anemia, unspecified: Secondary | ICD-10-CM | POA: Diagnosis not present

## 2018-06-14 LAB — CMP (CANCER CENTER ONLY)
ALT: 16 U/L (ref 0–44)
AST: 13 U/L — ABNORMAL LOW (ref 15–41)
Albumin: 3.4 g/dL — ABNORMAL LOW (ref 3.5–5.0)
Alkaline Phosphatase: 65 U/L (ref 38–126)
Anion gap: 9 (ref 5–15)
BUN: 12 mg/dL (ref 6–20)
CO2: 26 mmol/L (ref 22–32)
Calcium: 9.1 mg/dL (ref 8.9–10.3)
Chloride: 107 mmol/L (ref 98–111)
Creatinine: 0.83 mg/dL (ref 0.44–1.00)
GFR, Est AFR Am: >60 mL/min (ref >60)
GFR, Estimated: >60 mL/min (ref >60)
Glucose, Bld: 70 mg/dL (ref 70–99)
Potassium: 4 mmol/L (ref 3.5–5.1)
Sodium: 142 mmol/L (ref 135–145)
Total Bilirubin: <0.2 mg/dL — ABNORMAL LOW (ref 0.3–1.2)
Total Protein: 7.1 g/dL (ref 6.5–8.1)

## 2018-06-14 LAB — CBC WITH DIFFERENTIAL (CANCER CENTER ONLY)
Abs Immature Granulocytes: 0.04 10*3/uL (ref 0.00–0.07)
Basophils Absolute: 0.1 10*3/uL (ref 0.0–0.1)
Basophils Relative: 1 %
Eosinophils Absolute: 0.3 10*3/uL (ref 0.0–0.5)
Eosinophils Relative: 3 %
HCT: 36.4 % (ref 36.0–46.0)
Hemoglobin: 10.5 g/dL — ABNORMAL LOW (ref 12.0–15.0)
Immature Granulocytes: 0 %
Lymphocytes Relative: 36 %
Lymphs Abs: 3.3 10*3/uL (ref 0.7–4.0)
MCH: 23.8 pg — ABNORMAL LOW (ref 26.0–34.0)
MCHC: 28.8 g/dL — ABNORMAL LOW (ref 30.0–36.0)
MCV: 82.4 fL (ref 80.0–100.0)
Monocytes Absolute: 0.8 10*3/uL (ref 0.1–1.0)
Monocytes Relative: 9 %
Neutro Abs: 4.7 10*3/uL (ref 1.7–7.7)
Neutrophils Relative %: 51 %
Platelet Count: 395 10*3/uL (ref 150–400)
RBC: 4.42 MIL/uL (ref 3.87–5.11)
RDW: 23.7 % — ABNORMAL HIGH (ref 11.5–15.5)
WBC Count: 9.2 10*3/uL (ref 4.0–10.5)
nRBC: 0 % (ref 0.0–0.2)

## 2018-06-14 LAB — LACTATE DEHYDROGENASE: LDH: 154 U/L (ref 98–192)

## 2018-06-14 LAB — FERRITIN: Ferritin: 109 ng/mL (ref 11–307)

## 2018-06-15 ENCOUNTER — Inpatient Hospital Stay: Payer: Medicaid Other | Attending: Internal Medicine | Admitting: Internal Medicine

## 2018-06-15 DIAGNOSIS — D500 Iron deficiency anemia secondary to blood loss (chronic): Secondary | ICD-10-CM

## 2018-06-15 NOTE — Progress Notes (Signed)
Virtual Visit via Telephone Note  I connected with Elaine Perez on 06/15/18 at  8:50 AM EDT by telephone and verified that I am speaking with the correct person using two identifiers.   I discussed the limitations, risks, security and privacy concerns of performing an evaluation and management service by telephone and the availability of in person appointments. I also discussed with the patient that there may be a patient responsible charge related to this service. The patient expressed understanding and agreed to proceed.  Interval History:  Historical data obtained from note dated 04/27/2018.  33 year old female referred for evaluation due to persistent iron deficiency anemia.  Pt was seen at [facility] and was treated in the past with IV iron. Pt has an intolerance for oral iron.  She has been told she has uterine fibroids.  She has a family history of various cancers.  She reports her menstrual cycles occur 2 times a month.  Labs done 04/12/2018 showed WBC 8 HB 7.4 plts 542,000.  MCV 69.  Chemistries WNL with K+ 4.2 Cr 0.9.  Pt was transfused on 04/12/2018.  She reports craving ice.  She reports fatigue and numbness in hands.    Observations/Objective: Review of labs from 06/14/2018  Assessment and Plan:  1.  Iron deficiency anemia (IDA).   33 year old female referred for evaluation due to persistent iron deficiency anemia.  Pt has an intolerance for oral iron.  She has been told she has uterine fibroids.  She has a family history of various cancers.  She reports her menstrual cycles occur 2 times a month.  Labs done 04/12/2018 showed WBC 8 HB 7.4 plts 542,000.  MCV 69.  Chemistries WNL with K+ 4.2 Cr 0.9.  Pt was transfused on 04/12/2018.  She reports craving ice.  She reports fatigue and numbness in hands.    Labs done 04/27/2018 reviewed and showed WBC 8.4 HB 9.1 plts 459,000.  MCV 74.  Chemistries WNL with K+ 3.9 Cr 0.80 and normal LFTs.  Ferritin < 4.  Folate 12 and B12 232.    Pt  was treated with Feraheme 05/04/2018 and 06/01/2018.    Labs done 06/14/2018 reviewed and showed WBC 9.2 HB 10.5 plts 395,000.  Chemistries WNL with K+ 4 Cr 0.83 and normal LFTs.  Ferritin is improved at 109.  Hb is improved at 10.5.  Pt will RTC in 08/2018 with repeat labs.   She is advised to follow-up with GYN if ongoing menorrhagia.    2.  Menorrhagia.  Likely due to uterine fibroids.  Pt is encouraged to follow-up with GYN.  Likely etiology of IDA.    3.  Hypertension.  BP was 137/93.  Follow-up with PCP.   4.  Neuropathy.  Pt has reported DM.  Follow-up with PCP if ongoing symptoms.    5.  Health maintenance.  Will consider GI referral if counts fail to continue to improve.    Follow Up Instructions: Follow-up 08/2018 with labs.      I discussed the assessment and treatment plan with the patient. The patient was provided an opportunity to ask questions and all were answered. The patient agreed with the plan and demonstrated an understanding of the instructions.   The patient was advised to call back or seek an in-person evaluation if the symptoms worsen or if the condition fails to improve as anticipated.  I provided 15 minutes of non-face-to-face time during this encounter.   Ahmed Prima, MD

## 2018-06-18 ENCOUNTER — Telehealth: Payer: Self-pay | Admitting: Internal Medicine

## 2018-06-18 NOTE — Telephone Encounter (Signed)
Called regarding schedule °

## 2018-08-22 ENCOUNTER — Inpatient Hospital Stay: Payer: Medicaid Other | Admitting: Internal Medicine

## 2018-08-22 ENCOUNTER — Inpatient Hospital Stay: Payer: Medicaid Other | Attending: Internal Medicine

## 2018-08-22 DIAGNOSIS — I10 Essential (primary) hypertension: Secondary | ICD-10-CM | POA: Insufficient documentation

## 2018-08-22 DIAGNOSIS — N920 Excessive and frequent menstruation with regular cycle: Secondary | ICD-10-CM | POA: Insufficient documentation

## 2018-08-22 DIAGNOSIS — E1140 Type 2 diabetes mellitus with diabetic neuropathy, unspecified: Secondary | ICD-10-CM | POA: Insufficient documentation

## 2018-08-22 DIAGNOSIS — D509 Iron deficiency anemia, unspecified: Secondary | ICD-10-CM | POA: Insufficient documentation

## 2018-08-31 ENCOUNTER — Inpatient Hospital Stay (HOSPITAL_BASED_OUTPATIENT_CLINIC_OR_DEPARTMENT_OTHER): Payer: Medicaid Other | Admitting: Internal Medicine

## 2018-08-31 ENCOUNTER — Encounter: Payer: Self-pay | Admitting: Gastroenterology

## 2018-08-31 ENCOUNTER — Other Ambulatory Visit: Payer: Self-pay

## 2018-08-31 ENCOUNTER — Inpatient Hospital Stay: Payer: Medicaid Other

## 2018-08-31 VITALS — BP 142/87 | HR 87 | Temp 98.9°F | Resp 18 | Ht 61.0 in | Wt 231.3 lb

## 2018-08-31 DIAGNOSIS — N920 Excessive and frequent menstruation with regular cycle: Secondary | ICD-10-CM

## 2018-08-31 DIAGNOSIS — D509 Iron deficiency anemia, unspecified: Secondary | ICD-10-CM

## 2018-08-31 DIAGNOSIS — I10 Essential (primary) hypertension: Secondary | ICD-10-CM

## 2018-08-31 DIAGNOSIS — D500 Iron deficiency anemia secondary to blood loss (chronic): Secondary | ICD-10-CM

## 2018-08-31 DIAGNOSIS — E1140 Type 2 diabetes mellitus with diabetic neuropathy, unspecified: Secondary | ICD-10-CM

## 2018-08-31 LAB — CBC WITH DIFFERENTIAL (CANCER CENTER ONLY)
Abs Immature Granulocytes: 0.04 10*3/uL (ref 0.00–0.07)
Basophils Absolute: 0 10*3/uL (ref 0.0–0.1)
Basophils Relative: 1 %
Eosinophils Absolute: 0.2 10*3/uL (ref 0.0–0.5)
Eosinophils Relative: 3 %
HCT: 31.1 % — ABNORMAL LOW (ref 36.0–46.0)
Hemoglobin: 9.1 g/dL — ABNORMAL LOW (ref 12.0–15.0)
Immature Granulocytes: 1 %
Lymphocytes Relative: 34 %
Lymphs Abs: 2.8 10*3/uL (ref 0.7–4.0)
MCH: 23.4 pg — ABNORMAL LOW (ref 26.0–34.0)
MCHC: 29.3 g/dL — ABNORMAL LOW (ref 30.0–36.0)
MCV: 79.9 fL — ABNORMAL LOW (ref 80.0–100.0)
Monocytes Absolute: 0.7 10*3/uL (ref 0.1–1.0)
Monocytes Relative: 9 %
Neutro Abs: 4.3 10*3/uL (ref 1.7–7.7)
Neutrophils Relative %: 52 %
Platelet Count: 557 10*3/uL — ABNORMAL HIGH (ref 150–400)
RBC: 3.89 MIL/uL (ref 3.87–5.11)
RDW: 16.7 % — ABNORMAL HIGH (ref 11.5–15.5)
WBC Count: 8.1 10*3/uL (ref 4.0–10.5)
nRBC: 0.7 % — ABNORMAL HIGH (ref 0.0–0.2)

## 2018-08-31 LAB — CMP (CANCER CENTER ONLY)
ALT: 14 U/L (ref 0–44)
AST: 12 U/L — ABNORMAL LOW (ref 15–41)
Albumin: 3.2 g/dL — ABNORMAL LOW (ref 3.5–5.0)
Alkaline Phosphatase: 77 U/L (ref 38–126)
Anion gap: 9 (ref 5–15)
BUN: 13 mg/dL (ref 6–20)
CO2: 23 mmol/L (ref 22–32)
Calcium: 8.7 mg/dL — ABNORMAL LOW (ref 8.9–10.3)
Chloride: 108 mmol/L (ref 98–111)
Creatinine: 0.74 mg/dL (ref 0.44–1.00)
GFR, Est AFR Am: >60 mL/min (ref >60)
GFR, Estimated: >60 mL/min (ref >60)
Glucose, Bld: 109 mg/dL — ABNORMAL HIGH (ref 70–99)
Potassium: 4.2 mmol/L (ref 3.5–5.1)
Sodium: 140 mmol/L (ref 135–145)
Total Bilirubin: <0.2 mg/dL — ABNORMAL LOW (ref 0.3–1.2)
Total Protein: 7.3 g/dL (ref 6.5–8.1)

## 2018-08-31 LAB — FERRITIN: Ferritin: <4 ng/mL — ABNORMAL LOW (ref 11–307)

## 2018-08-31 LAB — LACTATE DEHYDROGENASE: LDH: 135 U/L (ref 98–192)

## 2018-08-31 NOTE — Progress Notes (Signed)
Diagnosis Iron deficiency anemia due to chronic blood loss - Plan: Ambulatory referral to Gynecology, Ambulatory referral to Gastroenterology, CBC with Differential ([place] Only), CMP ([place] only), Lactate dehydrogenase (LDH), Ferritin, CANCELED: CBC with Differential ([place] Only), CANCELED: CMP ([place] only), CANCELED: Lactate dehydrogenase (LDH), CANCELED: Ferritin  Staging Cancer Staging No matching staging information was found for the patient.  Assessment and Plan:   1.  Iron deficiency anemia (IDA).   33 year old female initially referred for evaluation due to persistent iron deficiency anemia.  Pt has an intolerance for oral iron.  She has been told she has uterine fibroids.  She has a family history of various cancers.  She reports her menstrual cycles occur 2 times a month.  Labs done 04/12/2018 showed WBC 8 HB 7.4 plts 542,000.  MCV 69.  Chemistries WNL with K+ 4.2 Cr 0.9.  Pt was transfused on 04/12/2018.   Labs done 04/27/2018 reviewed and showed WBC 8.4 HB 9.1 plts 459,000.  MCV 74.  Chemistries WNL with K+ 3.9 Cr 0.80 and normal LFTs.  Ferritin < 4.  Folate 12 and B12 232.    Pt was treated with Feraheme 05/04/2018 and 06/01/2018.    Labs done 06/14/2018 showed WBC 9.2 HB 10.5 plts 395,000.  Chemistries WNL with K+ 4 Cr 0.83 and normal LFTs.  Ferritin is improved at 109.    Labs done 08/31/2018 reviewed and showed WBC 8.1 HB 9.1 Plts 557,000.  Chemistries showed K+ 4.2 Cr 0.74 and normal LFTs.  Ferritin < 4.  Pt is again showing evidence of IDA.  She reports ongoing menorrhagia.  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  Pt is set up for repeat Feraheme 510 mg IV D1 and D8.  Side effects of IV iron previously discussed.  She is referred to GI and GYN.    2.  Menorrhagia.  Likely due to uterine fibroids.  Pt is referred to GYN locally for evaluation due to recurrent IDA. Likely etiology of IDA.    3.  Hypertension.  BP is 142/87.  Pt is referred to Dr.  Margarita Rana as she has not established with PCP.    4.  Neuropathy.  Pt has reported DM.  Blood sugar 109 on chemistries.  Pt is referred to Dr. Margarita Rana to establish with PCP.    5.  Health maintenance.  Pt referred to GI for evaluation due to IDA.  Pt referred to Dr. Margarita Rana for PCP.    25 minutes spent with more than 50% spent in counseling and coordination of care.     Interval History:  Historical data obtained from note dated 04/27/2018.  33 year old female referred for evaluation due to persistent iron deficiency anemia.  Pt was seen at [facility] and was treated in the past with IV iron. Pt has an intolerance for oral iron.  She has been told she has uterine fibroids.  She has a family history of various cancers.  She reports her menstrual cycles occur 2 times a month.  Labs done 04/12/2018 showed WBC 8 HB 7.4 plts 542,000.  MCV 69.  Chemistries WNL with K+ 4.2 Cr 0.9.  Pt was transfused on 04/12/2018.    Current Status:  Pt is seen today for follow-up.  She is here to go over labs.  She reports craving ice.  She also reports menorrhagia.    Oncology History   No history exists.     Problem List Patient Active  Problem List   Diagnosis Date Noted   Iron deficiency anemia due to chronic blood loss [D50.0] 08/31/2018   Iron deficiency anemia [D50.9] 04/27/2018    Past Medical History Past Medical History:  Diagnosis Date   Diabetes mellitus without complication ([place])    Fibroids    Hypertension    Iron deficiency anemia 04/27/2018    Past Surgical History Past Surgical History:  Procedure Laterality Date   GALLBLADDER SURGERY     REPEAT CESAREAN SECTION      Family History Family History  Problem Relation Age of Onset   Breast cancer Other    Lupus Other      Social History  reports that she has never smoked. She has never used smokeless tobacco. She reports that she does not drink alcohol or use drugs.  Medications  Current Outpatient  Medications:    albuterol (VENTOLIN HFA) 108 (90 Base) MCG/ACT inhaler, Inhale 2 puffs into the lungs as needed., Disp: , Rfl:    benzonatate (TESSALON) 100 MG capsule, Take 1 capsule (100 mg total) by mouth every 8 (eight) hours., Disp: 21 capsule, Rfl: 0   Fluticasone-Salmeterol (ADVAIR) 250-50 MCG/DOSE AEPB, Inhale into the lungs., Disp: , Rfl:   Allergies Morphine and related  Review of Systems Review of Systems - Oncology ROS negative craving ice   Physical Exam  Vitals Wt Readings from Last 3 Encounters:  08/31/18 231 lb 4.8 oz (104.9 kg)  05/04/18 231 lb (104.8 kg)  04/27/18 231 lb 8 oz (105 kg)   Temp Readings from Last 3 Encounters:  08/31/18 98.9 F (37.2 C) (Oral)  06/01/18 97.8 F (36.6 C) (Oral)  05/04/18 99.3 F (37.4 C) (Oral)   BP Readings from Last 3 Encounters:  08/31/18 (!) 142/87  06/01/18 (!) 148/92  05/04/18 (!) 135/1   Pulse Readings from Last 3 Encounters:  08/31/18 87  06/01/18 67  05/04/18 78   Constitutional: Well-developed, well-nourished, and in no distress.   HENT: Head: Normocephalic and atraumatic.  Mouth/Throat: No oropharyngeal exudate. Mucosa moist. Eyes: Pupils are equal, round, and reactive to light. Conjunctivae are normal. No scleral icterus.  Neck: Normal range of motion. Neck supple. No JVD present.  Cardiovascular: Normal rate, regular rhythm and normal heart sounds.  Exam reveals no gallop and no friction rub.   No murmur heard. Pulmonary/Chest: Effort normal and breath sounds normal. No respiratory distress. No wheezes.No rales.  Abdominal: Soft. Bowel sounds are normal. Obese.  NT.  There is no guarding.  Musculoskeletal: No edema or tenderness.  Lymphadenopathy: No cervical, axillary or supraclavicular adenopathy.  Neurological: Alert and oriented to person, place, and time. No cranial nerve deficit.  Skin: Skin is warm and dry. No rash noted. No erythema. No pallor.  Psychiatric: Affect and judgment normal.    Labs Appointment on 08/31/2018  Component Date Value Ref Range Status   Ferritin 08/31/2018 <4* 11 - 307 ng/mL Final   Performed at [facility], [place] [address] [place], [place] [address]   LDH 08/31/2018 135  98 - 192 U/L Final   Performed at [facility], [place] [address] [place], [place] [address]   Sodium 08/31/2018 140  135 - 145 mmol/L Final   Potassium 08/31/2018 4.2  3.5 - 5.1 mmol/L Final   Chloride 08/31/2018 108  98 - 111 mmol/L Final   CO2 08/31/2018 23  22 - 32 mmol/L Final   Glucose, Bld 08/31/2018 109* 70 - 99 mg/dL Final  BUN 08/31/2018 13  6 - 20 mg/dL Final   Creatinine 08/31/2018 0.74  0.44 - 1.00 mg/dL Final   Calcium 08/31/2018 8.7* 8.9 - 10.3 mg/dL Final   Total Protein 08/31/2018 7.3  6.5 - 8.1 g/dL Final   Albumin 08/31/2018 3.2* 3.5 - 5.0 g/dL Final   AST 08/31/2018 12* 15 - 41 U/L Final   ALT 08/31/2018 14  0 - 44 U/L Final   Alkaline Phosphatase 08/31/2018 77  38 - 126 U/L Final   Total Bilirubin 08/31/2018 <0.2* 0.3 - 1.2 mg/dL Final   GFR, Est Non Af Am 08/31/2018 >60  >60 mL/min Final   GFR, Est AFR Am 08/31/2018 >60  >60 mL/min Final   Anion gap 08/31/2018 9  5 - 15 Final   Performed at [facility], [place] [address], [place], [place] [address]   WBC Count 08/31/2018 8.1  4.0 - 10.5 K/uL Final   RBC 08/31/2018 3.89  3.87 - 5.11 MIL/uL Final   Hemoglobin 08/31/2018 9.1* 12.0 - 15.0 g/dL Final   HCT 08/31/2018 31.1* 36.0 - 46.0 % Final   MCV 08/31/2018 79.9* 80.0 - 100.0 fL Final   MCH 08/31/2018 23.4* 26.0 - 34.0 pg Final   MCHC 08/31/2018 29.3* 30.0 - 36.0 g/dL Final   RDW 08/31/2018 16.7* 11.5 - 15.5 % Final   Platelet Count 08/31/2018 557* 150 - 400 K/uL Final   nRBC 08/31/2018 0.7* 0.0 - 0.2 % Final   Neutrophils Relative % 08/31/2018 52  % Final   Neutro Abs 08/31/2018 4.3  1.7 - 7.7 K/uL Final   Lymphocytes Relative 08/31/2018 34  %  Final   Lymphs Abs 08/31/2018 2.8  0.7 - 4.0 K/uL Final   Monocytes Relative 08/31/2018 9  % Final   Monocytes Absolute 08/31/2018 0.7  0.1 - 1.0 K/uL Final   Eosinophils Relative 08/31/2018 3  % Final   Eosinophils Absolute 08/31/2018 0.2  0.0 - 0.5 K/uL Final   Basophils Relative 08/31/2018 1  % Final   Basophils Absolute 08/31/2018 0.0  0.0 - 0.1 K/uL Final   Immature Granulocytes 08/31/2018 1  % Final   Abs Immature Granulocytes 08/31/2018 0.04  0.00 - 0.07 K/uL Final   Performed at [facility], [place] [address], [place], [place] [address]     Pathology Orders Placed This Encounter  Procedures   CBC with Differential ([place] Only)    Standing Status:   Future    Standing Expiration Date:   08/31/2019   CMP ([place] only)    Standing Status:   Future    Standing Expiration Date:   08/31/2019   Lactate dehydrogenase (LDH)    Standing Status:   Future    Standing Expiration Date:   08/31/2019   Ferritin    Standing Status:   Future    Standing Expiration Date:   08/31/2019   Ambulatory referral to Gynecology    Referral Priority:   Routine    Referral Type:   Consultation    Referral Reason:   Specialty Services Required    Requested Specialty:   Gynecology    Number of Visits Requested:   1   Ambulatory referral to Gastroenterology    Referral Priority:   Routine    Referral Type:   Consultation    Referral Reason:   Specialty Services Required    Number of Visits Requested:   1   Follow-up primary physician    Refer to  Dr. Charlott Rakes if possible       Zoila Shutter MD

## 2018-09-03 ENCOUNTER — Telehealth: Payer: Self-pay | Admitting: Internal Medicine

## 2018-09-03 NOTE — Telephone Encounter (Signed)
Called and spoke with patient. Confirmed upcoming appts  

## 2018-09-07 ENCOUNTER — Inpatient Hospital Stay: Payer: Medicaid Other

## 2018-09-14 ENCOUNTER — Inpatient Hospital Stay: Payer: Medicaid Other

## 2018-09-25 ENCOUNTER — Other Ambulatory Visit: Payer: Self-pay

## 2018-09-25 DIAGNOSIS — I10 Essential (primary) hypertension: Secondary | ICD-10-CM | POA: Insufficient documentation

## 2018-09-25 DIAGNOSIS — Z79899 Other long term (current) drug therapy: Secondary | ICD-10-CM | POA: Insufficient documentation

## 2018-09-25 DIAGNOSIS — Z791 Long term (current) use of non-steroidal anti-inflammatories (NSAID): Secondary | ICD-10-CM | POA: Diagnosis not present

## 2018-09-25 DIAGNOSIS — M545 Low back pain: Secondary | ICD-10-CM | POA: Diagnosis not present

## 2018-09-25 DIAGNOSIS — E119 Type 2 diabetes mellitus without complications: Secondary | ICD-10-CM | POA: Insufficient documentation

## 2018-09-26 ENCOUNTER — Emergency Department (HOSPITAL_COMMUNITY)
Admission: EM | Admit: 2018-09-26 | Discharge: 2018-09-26 | Disposition: A | Discharge status: Home / Self Care | Payer: Medicaid Other | Attending: Emergency Medicine | Admitting: Emergency Medicine

## 2018-09-26 ENCOUNTER — Other Ambulatory Visit: Payer: Self-pay

## 2018-09-26 ENCOUNTER — Encounter (HOSPITAL_COMMUNITY): Payer: Self-pay | Admitting: Family Medicine

## 2018-09-26 DIAGNOSIS — M545 Low back pain, unspecified: Secondary | ICD-10-CM

## 2018-09-26 LAB — URINALYSIS, ROUTINE W REFLEX MICROSCOPIC
Bilirubin Urine: NEGATIVE
Glucose, UA: NEGATIVE mg/dL
Hgb urine dipstick: NEGATIVE
Ketones, ur: NEGATIVE mg/dL
Leukocytes,Ua: NEGATIVE
Nitrite: NEGATIVE
Protein, ur: NEGATIVE mg/dL
Specific Gravity, Urine: 1.025 (ref 1.005–1.030)
pH: 5 (ref 5.0–8.0)

## 2018-09-26 MED ORDER — KETOROLAC TROMETHAMINE 30 MG/ML IJ SOLN
30.0000 mg | Freq: Once | INTRAMUSCULAR | Status: AC
  Administered 2018-09-26: 30 mg via INTRAMUSCULAR
  Filled 2018-09-26: qty 1

## 2018-09-26 MED ORDER — NAPROXEN 500 MG PO TABS: 500.0000 mg | ORAL_TABLET | Freq: Two times a day (BID) | ORAL | 0 refills | Status: DC

## 2018-09-26 NOTE — ED Triage Notes (Signed)
Patient is complaining of lower back pain that started 4-5 days ago. Patient denies any recent injury or urinary symptoms. Patient is ambulatory in triage.

## 2018-09-26 NOTE — ED Provider Notes (Signed)
[facility] DEPT Provider Note   CSN: [id number] Arrival date & time: 09/25/18  2349     History   Chief Complaint Chief Complaint  Patient presents with   Back Pain    HPI Elaine Perez is a 33 y.o. female.     HPI  This is a 33 year old female with a history of diabetes, fibroids, hypertension who presents with lower back pain.  Patient reports bilateral lower back pain over the last 3 to 4 days.  She denies any heavy lifting or injury.  Pain is worse with movements.  Denies any urinary symptoms or fevers.  Denies any chest pain, shortness of breath, nausea, vomiting, abdominal pain.  Denies any weakness, numbness, tingling of the lower extremities, bowel or bladder issues.  Currently she rates her pain 8 out of 10.  She has not taken anything for the pain.  Past Medical History:  Diagnosis Date   Diabetes mellitus without complication ([place])    Fibroids    Hypertension    Iron deficiency anemia 04/27/2018    Patient Active Problem List   Diagnosis Date Noted   Iron deficiency anemia due to chronic blood loss 08/31/2018   Iron deficiency anemia 04/27/2018    Past Surgical History:  Procedure Laterality Date   GALLBLADDER SURGERY     REPEAT CESAREAN SECTION       OB History   No obstetric history on file.      Home Medications    Prior to Admission medications   Medication Sig Start Date End Date Taking? Authorizing Provider  albuterol (VENTOLIN HFA) 108 (90 Base) MCG/ACT inhaler Inhale 2 puffs into the lungs as needed. 06/15/10   [provider]  benzonatate (TESSALON) 100 MG capsule Take 1 capsule (100 mg total) by mouth every 8 (eight) hours. 09/12/17   Hedges, Dellis Filbert, PA-C  Fluticasone-Salmeterol (ADVAIR) 250-50 MCG/DOSE AEPB Inhale into the lungs. 05/23/18   [provider]  naproxen (NAPROSYN) 500 MG tablet Take 1 tablet (500 mg total) by mouth 2 (two) times daily. 09/26/18   Zykira Matlack, Barbette Hair, MD     Family History Family History  Problem Relation Age of Onset   Breast cancer Other    Lupus Other     Social History Social History   Tobacco Use   Smoking status: Never Smoker   Smokeless tobacco: Never Used  Substance Use Topics   Alcohol use: Never    Frequency: Never   Drug use: Never     Allergies   Morphine and related   Review of Systems Review of Systems  Constitutional: Negative for fever.  Respiratory: Negative for shortness of breath.   Cardiovascular: Negative for chest pain.  Gastrointestinal: Negative for abdominal pain.  Genitourinary: Negative for difficulty urinating and dysuria.  Musculoskeletal: Positive for back pain.  Neurological: Negative for weakness.  All other systems reviewed and are negative.    Physical Exam Updated Vital Signs BP (!) 157/103 (BP Location: Left Arm)    Pulse 78    Temp 98.1 F (36.7 C) (Oral)    Resp 18    Ht 1.549 m (5\' 1" )    Wt 102.1 kg    SpO2 100%    BMI 42.51 kg/m   Physical Exam Vitals signs and nursing note reviewed.  Constitutional:      Appearance: She is well-developed. She is obese. She is not ill-appearing.  HENT:     Head: Normocephalic and atraumatic.  Neck:     Musculoskeletal:  Neck supple.  Cardiovascular:     Rate and Rhythm: Normal rate and regular rhythm.     Heart sounds: Normal heart sounds.  Pulmonary:     Effort: Pulmonary effort is normal. No respiratory distress.     Breath sounds: No wheezing.  Abdominal:     General: Bowel sounds are normal.     Palpations: Abdomen is soft.     Tenderness: There is no right CVA tenderness or left CVA tenderness.  Musculoskeletal:     Comments: Tenderness to palpation bilateral paraspinous muscle region, no midline tenderness, step-off, or deformity  Skin:    General: Skin is warm and dry.  Neurological:     Mental Status: She is alert and oriented to person, place, and time.     Comments: Normal gait, 5 out of 5 strength bilateral  lower extremities, normal reflexes  Psychiatric:        Mood and Affect: Mood normal.      ED Treatments / Results  Labs (all labs ordered are listed, but only abnormal results are displayed) Labs Reviewed  URINALYSIS, ROUTINE W REFLEX MICROSCOPIC    EKG None  Radiology No results found.  Procedures Procedures (including critical care time)  Medications Ordered in ED Medications  ketorolac (TORADOL) 30 MG/ML injection 30 mg (30 mg Intramuscular Given 09/26/18 0240)     Initial Impression / Assessment and Plan / ED Course  I have reviewed the triage vital signs and the nursing notes.  Pertinent labs & imaging results that were available during my care of the patient were reviewed by me and considered in my medical decision making (see chart for details).        Patient presents with back pain.  Overall nontoxic-appearing vital signs are reassuring.  Normal exam.  Low risk without red flags.  Urinalysis without evidence of UTI.  Patient was given Toradol with significant improvement of pain.  Suspect musculoskeletal etiology.  Given her weight, this could be contributing as well.  Recommend anti-inflammatories at home.  After history, exam, and medical workup I feel the patient has been appropriately medically screened and is safe for discharge home. Pertinent diagnoses were discussed with the patient. Patient was given return precautions.   Final Clinical Impressions(s) / ED Diagnoses   Final diagnoses:  Acute bilateral low back pain without sciatica    ED Discharge Orders         Ordered    naproxen (NAPROSYN) 500 MG tablet  2 times daily     09/26/18 0326           Kayliee Atienza, Barbette Hair, MD 09/26/18 0330

## 2018-09-26 NOTE — Discharge Instructions (Addendum)
You were seen today for back pain.  Urine and urinalysis is reassuring.  This is likely musculoskeletal in nature.  Follow-up with your primary physician if not improving.  Take naproxen for pain.

## 2018-10-01 NOTE — Progress Notes (Signed)
Referring Provider: Zoila Shutter, MD Primary Care Physician:  Patient, No Pcp Per  Reason for Consultation: Anemia, blood loss   IMPRESSION:  Persistent iron deficiency anemia without overt GI blood loss, pica    -Intolerant of oral iron    - PRBCs 04/12/18    - Feraheme Hemoccult+ GERD not responding to Pepcid Menorrhagia due to uterine fibroids under consideration for hysterectomy BMI 43.84 Occasional NSAIDs for menstrual pain No known family history of colon cancer or polyps  Persistent iron deficiency anemia without overt GI blood loss. Heavy menses may be the cause. However, concurrent GI blood loss must be excluded. EGD with duodenal biopsies and colonoscopy recommended.   PLAN: Pantoprazole 40 mg daily EGD and colonoscopy  Please see the "Patient Instructions" section for addition details about the plan.  HPI: Elaine Perez is a 33 y.o. female referred by Dr. Ishmael Holter for further evaluation of persistent iron deficiency anemia.  The history is obtained through the patient and review of her electronic health record. She diabetes, hypertension, depression, OSA on CPAP.   Labs 04/12/2018: WBC 8,  HB 7.4, plts 542,000, MCV 69.   Transfused PRBCs on 04/12/2018.  Labs 04/27/2018: WBC 8.4 HB 9.1 plts 459,000.  MCV 74.  Normal LFTs. Ferritin < 4.  Folate 12 and B12 232.  Normal SPEP.  Treated with Feraheme 05/04/2018 and 06/01/2018.   Labs 06/14/2018: WBC 9.2 HB 10.5 plts 395,000.  Ferritin is improved at 109.   Labs 08/31/2018:  WBC 8.1 HB 9.1 Plts 557,000.  Ferritin < 4.   Treated with Feraheme 7/24/ 20 and 09/14/18  She has been intolerant of oral iron due to nausea.   No symptoms associated with the anemia.  No fatigue, weakness, headache, irritability, exercise intolerance, exertional dyspnea, vertigo, or angina pectoris.  Craves ice when her blood counts are low.  No beeturia.  No hearing loss.    No overt GI blood loss. No melena, hematochezia, bright red blood per rectum. GI  ROS is negative for reflux and poor appetite. She takes Pepcid daily with incomplete relief.    She has menorrhagia likely due to uterine fibroids that have been thought to be the cause of her iron deficiency anemia. She reports two periods every month for 11 year.  Bleeds 8 days. Previously evaluated by GYN. No epistaxis, hemoptysis, or hematuria.   Uses Aleve with pain associated with her frequent menses. No other NSAIDs.  No identified exacerbating or relieving features.  No prior endoscopy.  No prior abdominal imaging.  Mother died of heart disease in her 74s. Father with hypertension. No known family history of colon cancer or polyps. No family history of uterine/endometrial cancer, pancreatic cancer or gastric/stomach cancer. No known family history of bleeding or anemia.    Past Medical History:  Diagnosis Date   Diabetes mellitus without complication ([place])    Fibroids    Hypertension    Iron deficiency anemia 04/27/2018    Past Surgical History:  Procedure Laterality Date   GALLBLADDER SURGERY     REPEAT CESAREAN SECTION      Prior to Admission medications   Medication Sig Start Date End Date Taking? Authorizing Provider  albuterol (VENTOLIN HFA) 108 (90 Base) MCG/ACT inhaler Inhale 2 puffs into the lungs as needed. 06/15/10   [provider]  benzonatate (TESSALON) 100 MG capsule Take 1 capsule (100 mg total) by mouth every 8 (eight) hours. 09/12/17   Hedges, Dellis Filbert, PA-C  Fluticasone-Salmeterol (ADVAIR) 250-50 MCG/DOSE AEPB Inhale into the  lungs. 05/23/18   [provider]  naproxen (NAPROSYN) 500 MG tablet Take 1 tablet (500 mg total) by mouth 2 (two) times daily. 09/26/18   Horton, Barbette Hair, MD    Current Outpatient Medications  Medication Sig Dispense Refill   albuterol (VENTOLIN HFA) 108 (90 Base) MCG/ACT inhaler Inhale 2 puffs into the lungs as needed.     benzonatate (TESSALON) 100 MG capsule Take 1 capsule (100 mg total) by mouth every 8  (eight) hours. 21 capsule 0   Fluticasone-Salmeterol (ADVAIR) 250-50 MCG/DOSE AEPB Inhale into the lungs.     naproxen (NAPROSYN) 500 MG tablet Take 1 tablet (500 mg total) by mouth 2 (two) times daily. 30 tablet 0   No current facility-administered medications for this visit.     Allergies as of 10/04/2018 - Review Complete 08/31/2018  Allergen Reaction Noted   Morphine and related  09/12/2017    Family History  Problem Relation Age of Onset   Breast cancer Other    Lupus Other     Social History   Socioeconomic History   Marital status: Married    Spouse name: Not on file   Number of children: Not on file   Years of education: Not on file   Highest education level: Not on file  Occupational History   Not on file  Social Needs   Financial resource strain: Not on file   Food insecurity    Worry: Not on file    Inability: Not on file   Transportation needs    Medical: Not on file    Non-medical: Not on file  Tobacco Use   Smoking status: Never Smoker   Smokeless tobacco: Never Used  Substance and Sexual Activity   Alcohol use: Never    Frequency: Never   Drug use: Never   Sexual activity: Not on file  Lifestyle   Physical activity    Days per week: Not on file    Minutes per session: Not on file   Stress: Not on file  Relationships   Social connections    Talks on phone: Not on file    Gets together: Not on file    Attends religious service: Not on file    Active member of club or organization: Not on file    Attends meetings of clubs or organizations: Not on file    Relationship status: Not on file   Intimate partner violence    Fear of current or ex partner: Not on file    Emotionally abused: Not on file    Physically abused: Not on file    Forced sexual activity: Not on file  Other Topics Concern   Not on file  Social History Narrative   Not on file    Review of Systems: 12 system ROS is negative except as noted above  except for the addition of changes in the shape of her face that developed 2 years ago.   Physical Exam: General:   Alert,  well-nourished, pleasant and cooperative in NAD Head:  Normocephalic and atraumatic. Eyes:  Sclera clear, no icterus.   Conjunctiva pink. Ears:  Normal auditory acuity. Nose:  No deformity, discharge,  or lesions. Mouth:  No deformity or lesions.   Neck:  Supple; no masses or thyromegaly. Lungs:  Clear throughout to auscultation.   No wheezes. Heart:  Regular rate and rhythm; no murmurs. Abdomen:  Soft,nontender, nondistended, normal bowel sounds, no rebound or guarding. No hepatosplenomegaly.   Rectal:  Deferred  Msk:  Symmetrical. No boney deformities LAD: No inguinal or umbilical LAD Extremities:  No clubbing or edema. Neurologic:  Alert and  oriented x4;  grossly nonfocal Skin:  Intact without significant lesions or rashes. Psych:  Alert and cooperative. Normal mood and affect.      Reece Mcbroom L. Tarri Glenn, MD, MPH 10/01/2018, 8:14 AM

## 2018-10-04 ENCOUNTER — Encounter (INDEPENDENT_AMBULATORY_CARE_PROVIDER_SITE_OTHER): Payer: Self-pay

## 2018-10-04 ENCOUNTER — Encounter: Payer: Self-pay | Admitting: Gastroenterology

## 2018-10-04 ENCOUNTER — Ambulatory Visit (INDEPENDENT_AMBULATORY_CARE_PROVIDER_SITE_OTHER): Payer: Medicaid Other | Admitting: Gastroenterology

## 2018-10-04 VITALS — BP 132/88 | HR 96 | Temp 99.1°F | Ht 61.0 in | Wt 232.0 lb

## 2018-10-04 DIAGNOSIS — K219 Gastro-esophageal reflux disease without esophagitis: Secondary | ICD-10-CM

## 2018-10-04 DIAGNOSIS — D509 Iron deficiency anemia, unspecified: Secondary | ICD-10-CM | POA: Diagnosis not present

## 2018-10-04 MED ORDER — PANTOPRAZOLE SODIUM 40 MG PO TBEC: 40.0000 mg | DELAYED_RELEASE_TABLET | Freq: Every day | ORAL | 3 refills | Status: DC

## 2018-10-04 MED ORDER — NA SULFATE-K SULFATE-MG SULF 17.5-3.13-1.6 GM/177ML PO SOLN: 1.0000 | ORAL | 0 refills | Status: AC

## 2018-10-04 NOTE — Patient Instructions (Addendum)
I have recommended a colonoscopy and upper endoscopy to evaluate the cause of your low iron and low blood counts.   Please start pantoprazole 40 mg taken every morning 30-60 minutes before breakfast. You may continue to use the Pepcid if you are still having any heartburn.  Tips for managing heartburn: Avoid any dietary triggers Avoid spicy and acidic foods Limit your intake of coffee, tea, alcohol, and carbonated drinks Work to maintain a healthy weight Keep the head of the bed elevated with blocks if you are having any nighttime symptoms Stay upright for 2 hours after eating Avoid meals and snacks three to four hours before bedtime  Tips for colonoscopy:  -STAY WELL HYDRATED FOR 3-4 DAYS PRIOR TO THE EXAM. This reduces nausea and dehydration.  -TO PREVENT SKIN/HEMORRHOID IRRITATION- prior to wiping, put A&Dointment or vaseline on the toilet paper. -Keep a towel or pad on the bed.  -DRINK 64oz of clear liquids in the morning of prep day (PRIOR TO STARTING THE PREP) to be sure that there is enough fluid to flush the colon and stay hydrated!!!! This is in addition to the fluids required for preparation.

## 2018-10-29 ENCOUNTER — Other Ambulatory Visit: Payer: Self-pay

## 2018-10-29 ENCOUNTER — Encounter (HOSPITAL_COMMUNITY): Payer: Self-pay | Admitting: *Deleted

## 2018-10-29 ENCOUNTER — Emergency Department (HOSPITAL_COMMUNITY)
Admission: EM | Admit: 2018-10-29 | Discharge: 2018-10-29 | Disposition: A | Discharge status: Home / Self Care | Payer: Medicaid Other | Attending: Emergency Medicine | Admitting: Emergency Medicine

## 2018-10-29 DIAGNOSIS — Z5321 Procedure and treatment not carried out due to patient leaving prior to being seen by health care provider: Secondary | ICD-10-CM | POA: Insufficient documentation

## 2018-10-29 DIAGNOSIS — R51 Headache: Secondary | ICD-10-CM | POA: Diagnosis present

## 2018-10-29 NOTE — ED Notes (Signed)
Pt called out x3 with no answer at this time.

## 2018-10-29 NOTE — ED Triage Notes (Signed)
The pt is c/o a headache for 3 weeks   She reports that  She has headaches whenever her blood count is low

## 2018-11-06 ENCOUNTER — Telehealth: Payer: Self-pay | Admitting: Internal Medicine

## 2018-11-06 NOTE — Telephone Encounter (Signed)
Returned patient's phone call regarding rescheduling cancelled 07/31 appointment, per patient's request appointment has been rescheduled for 09/25.

## 2018-11-09 ENCOUNTER — Other Ambulatory Visit: Payer: Self-pay

## 2018-11-09 ENCOUNTER — Inpatient Hospital Stay: Payer: Medicaid Other | Attending: Internal Medicine

## 2018-11-09 VITALS — BP 114/81 | HR 78 | Temp 98.9°F | Resp 16

## 2018-11-09 DIAGNOSIS — N920 Excessive and frequent menstruation with regular cycle: Secondary | ICD-10-CM | POA: Insufficient documentation

## 2018-11-09 DIAGNOSIS — D509 Iron deficiency anemia, unspecified: Secondary | ICD-10-CM | POA: Insufficient documentation

## 2018-11-09 DIAGNOSIS — D500 Iron deficiency anemia secondary to blood loss (chronic): Secondary | ICD-10-CM

## 2018-11-09 MED ORDER — DIPHENHYDRAMINE HCL 25 MG PO CAPS
25.0000 mg | ORAL_CAPSULE | Freq: Once | ORAL | Status: AC
  Administered 2018-11-09: 25 mg via ORAL

## 2018-11-09 MED ORDER — SODIUM CHLORIDE 0.9 % IV SOLN
Freq: Once | INTRAVENOUS | Status: AC
  Administered 2018-11-09: 12:00:00 via INTRAVENOUS
  Filled 2018-11-09: qty 250

## 2018-11-09 MED ORDER — SODIUM CHLORIDE 0.9 % IV SOLN
510.0000 mg | Freq: Once | INTRAVENOUS | Status: AC
  Administered 2018-11-09: 510 mg via INTRAVENOUS
  Filled 2018-11-09: qty 510

## 2018-11-09 MED ORDER — DIPHENHYDRAMINE HCL 25 MG PO CAPS
ORAL_CAPSULE | ORAL | Status: AC
  Filled 2018-11-09: qty 1

## 2018-11-09 NOTE — Patient Instructions (Signed)

## 2018-11-13 ENCOUNTER — Telehealth: Payer: Self-pay

## 2018-11-13 NOTE — Telephone Encounter (Signed)
Covid-19 screening questions   Do you now or have you had a fever in the last 14 days? NO   Do you have any respiratory symptoms of shortness of breath or cough now or in the last 14 days? NO  Do you have any family members or close contacts with diagnosed or suspected Covid-19 in the past 14 days? NO   Have you been tested for Covid-19 and found to be positive? NO

## 2018-11-14 ENCOUNTER — Ambulatory Visit (AMBULATORY_SURGERY_CENTER): Payer: Medicaid Other | Admitting: Gastroenterology

## 2018-11-14 ENCOUNTER — Encounter: Payer: Self-pay | Admitting: Gastroenterology

## 2018-11-14 ENCOUNTER — Other Ambulatory Visit: Payer: Self-pay

## 2018-11-14 VITALS — BP 132/63 | HR 78 | Temp 99.4°F | Resp 20 | Ht 61.0 in | Wt 232.0 lb

## 2018-11-14 DIAGNOSIS — R195 Other fecal abnormalities: Secondary | ICD-10-CM

## 2018-11-14 DIAGNOSIS — D509 Iron deficiency anemia, unspecified: Secondary | ICD-10-CM

## 2018-11-14 DIAGNOSIS — K219 Gastro-esophageal reflux disease without esophagitis: Secondary | ICD-10-CM

## 2018-11-14 MED ORDER — SODIUM CHLORIDE 0.9 % IV SOLN: 500.0000 mL | Freq: Once | INTRAVENOUS | Status: DC

## 2018-11-14 NOTE — Progress Notes (Signed)
Report to PACU, RN, vss, BBS= Clear.  When suctioning oropharynx post procedure I noticed what looked like either a bite mark or maybe an indentation from the bite block on her tongue.  Dr Tarri Glenn and PACU notified to monitor post procedure

## 2018-11-14 NOTE — Op Note (Addendum)
[facility] Patient Name: Elaine Perez Procedure Date: 11/14/2018 2:17 PM MRN: [id number] Endoscopist: Thornton Park MD, MD Age: 33 Referring MD:  Date of Birth: 1985/10/01 Gender: Female Account #: [id number] Procedure:                Upper GI endoscopy Indications:              Persistent iron deficiency anemia without overt GI                            blood loss, pica                           -Intolerant of oral iron                           - PRBCs 04/12/18                           - Requiring Feraheme                           Hemoccult+                           GERD not responding to Pepcid                           Occasional NSAIDs for menstrual pain Medicines:                See the Anesthesia note for documentation of the                            administered medications Procedure:                Pre-Anesthesia Assessment:                           - Prior to the procedure, a History and Physical                            was performed, and patient medications and                            allergies were reviewed. The patient's tolerance of                            previous anesthesia was also reviewed. The risks                            and benefits of the procedure and the sedation                            options and risks were discussed with the patient.                            All questions were answered, and informed consent  was obtained. Prior Anticoagulants: The patient has                            taken no previous anticoagulant or antiplatelet                            agents. ASA Grade Assessment: III - A patient with                            severe systemic disease. After reviewing the risks                            and benefits, the patient was deemed in                            satisfactory condition to undergo the procedure.                           After obtaining informed consent, the  endoscope was                            passed under direct vision. Throughout the                            procedure, the patient's blood pressure, pulse, and                            oxygen saturations were monitored continuously. The                            Endoscope was introduced through the mouth, and                            advanced to the third part of duodenum. The upper                            GI endoscopy was performed with moderate difficulty                            due to the patient's oxygen desaturation.                            Successful completion of the procedure was aided by                            performing chin lift and administering oxygen. The                            patient tolerated the procedure well. On removal of                            the bite-block, it appeared that the patient bit  her tongue. Scope In: Scope Out: Findings:                 The examined esophagus was normal. Biopsies were                            taken from the distal and mid and proximal                            esophagus with a cold forceps for histology and to                            exclude eosinophilic esophagitis. Estimated blood                            loss was minimal.                           The entire examined stomach was normal. Biopsies                            were taken from the antrum, body, and fundus with a                            cold forceps for histology. Estimated blood loss                            was minimal.                           The examined duodenum was normal. Biopsies were                            taken with a cold forceps for histology. Estimated                            blood loss: none.                           The cardia and gastric fundus were normal on                            retroflexion. Complications:            Hypoxia, tongue bite Estimated Blood Loss:      Estimated blood loss was minimal. Impression:               - Normal esophagus. Biopsied.                           - Normal stomach. Biopsied.                           - Normal examined duodenum. Biopsied.                           - No source for anemia identified on this  examination. Recommendation:           - Patient has a contact number available for                            emergencies. The signs and symptoms of potential                            delayed complications were discussed with the                            patient. Return to normal activities tomorrow.                            Written discharge instructions were provided to the                            patient.                           - Resume previous diet today.                           - Continue present medications including                            pantoprazole 40 mg every morning.                           - Await pathology results.                           - Proceed with colonoscopy as previously planned. Thornton Park MD, MD 11/14/2018 2:51:34 PM This report has been signed electronically.

## 2018-11-14 NOTE — Progress Notes (Signed)
Shortly after starting endo, pt had what appeared to be a laryngospasm (pt immediate apenic and sounds struggling to breathe).  o2 turned to max and jaw thrust preformed.  EGD was stopped. Pt sats came back up and EGD continued

## 2018-11-14 NOTE — Progress Notes (Signed)
Temp taken by KA VS taken by CW

## 2018-11-14 NOTE — Patient Instructions (Signed)
YOU HAD AN ENDOSCOPIC PROCEDURE TODAY AT [facility]:   Refer to the procedure report that was given to you for any specific questions about what was found during the examination.  If the procedure report does not answer your questions, please call your gastroenterologist to clarify.  If you requested that your care partner not be given the details of your procedure findings, then the procedure report has been included in a sealed envelope for you to review at your convenience later.  YOU SHOULD EXPECT: Some feelings of bloating in the abdomen. Passage of more gas than usual.  Walking can help get rid of the air that was put into your GI tract during the procedure and reduce the bloating. If you had a lower endoscopy (such as a colonoscopy or flexible sigmoidoscopy) you may notice spotting of blood in your stool or on the toilet paper. If you underwent a bowel prep for your procedure, you may not have a normal bowel movement for a few days.  Please Note:  You might notice some irritation and congestion in your nose or some drainage.  This is from the oxygen used during your procedure.  There is no need for concern and it should clear up in a day or so.  SYMPTOMS TO REPORT IMMEDIATELY:   Following lower endoscopy (colonoscopy or flexible sigmoidoscopy):  Excessive amounts of blood in the stool  Significant tenderness or worsening of abdominal pains  Swelling of the abdomen that is new, acute  Fever of 100F or higher   Following upper endoscopy (EGD)  Vomiting of blood or coffee ground material  New chest pain or pain under the shoulder blades  Painful or persistently difficult swallowing  New shortness of breath  Fever of 100F or higher  Black, tarry-looking stools  For urgent or emergent issues, a gastroenterologist can be reached at any hour by calling [phone].   DIET:  We do recommend a small meal at first, but then you may proceed to your regular diet.   Drink plenty of fluids but you should avoid alcoholic beverages for 24 hours.  ACTIVITY:  You should plan to take it easy for the rest of today and you should NOT DRIVE or use heavy machinery until tomorrow (because of the sedation medicines used during the test).    FOLLOW UP: Our staff will call the number listed on your records 48-72 hours following your procedure to check on you and address any questions or concerns that you may have regarding the information given to you following your procedure. If we do not reach you, we will leave a message.  We will attempt to reach you two times.  During this call, we will ask if you have developed any symptoms of COVID 19. If you develop any symptoms (ie: fever, flu-like symptoms, shortness of breath, cough etc.) before then, please call [phone].  If you test positive for Covid 19 in the 2 weeks post procedure, please call and report this information to [place].    If any biopsies were taken you will be contacted by phone or by letter within the next 1-3 weeks.  Please call us at [phone] if you have not heard about the biopsies in 3 weeks.    SIGNATURES/CONFIDENTIALITY: You and/or your care partner have signed paperwork which will be entered into your electronic medical record.  These signatures attest to the fact that that the information above on your After Visit Summary has been reviewed and  is understood.  Full responsibility of the confidentiality of this discharge information lies with you and/or your care-partner.

## 2018-11-14 NOTE — Op Note (Signed)
[facility] Patient Name: Elaine Perez Procedure Date: 11/14/2018 2:17 PM MRN: [id number] Endoscopist: Thornton Park MD, MD Age: 33 Referring MD:  Date of Birth: 04-20-1985 Gender: Female Account #: [id number] Procedure:                Colonoscopy Indications:              Persistent iron deficiency anemia without overt GI                            blood loss, pica                           -Intolerant of oral iron                           - PRBCs 04/12/18                           - Feraheme                           Hemoccult+                           No known family history of colon cancer or polyps Medicines:                See the Anesthesia note for documentation of the                            administered medications Procedure:                Pre-Anesthesia Assessment:                           - Prior to the procedure, a History and Physical                            was performed, and patient medications and                            allergies were reviewed. The patient's tolerance of                            previous anesthesia was also reviewed. The risks                            and benefits of the procedure and the sedation                            options and risks were discussed with the patient.                            All questions were answered, and informed consent                            was obtained. Prior Anticoagulants: The patient has  taken no previous anticoagulant or antiplatelet                            agents. ASA Grade Assessment: III - A patient with                            severe systemic disease. After reviewing the risks                            and benefits, the patient was deemed in                            satisfactory condition to undergo the procedure.                           After obtaining informed consent, the colonoscope                            was passed under direct  vision. Throughout the                            procedure, the patient's blood pressure, pulse, and                            oxygen saturations were monitored continuously. The                            Colonoscope was introduced through the anus and                            advanced to the the terminal ileum, with                            identification of the appendiceal orifice and IC                            valve. A second forward view of the right colon was                            performed. The colonoscopy was performed without                            difficulty. The patient tolerated the procedure                            well. The quality of the bowel preparation was                            excellent. The terminal ileum, ileocecal valve,                            appendiceal orifice, and rectum were photographed. Scope In: 2:34:43 PM Scope Out: 2:45:22 PM Scope Withdrawal Time: 0 hours 8 minutes 41 seconds  Total Procedure Duration: 0 hours 10 minutes 39 seconds  Findings:                 The perianal and digital rectal examinations were                            normal.                           The entire examined colon appeared normal on direct                            and retroflexion views. Complications:            No immediate complications. Estimated Blood Loss:     Estimated blood loss: none. Impression:               - The entire examined colon is normal on direct and                            retroflexion views.                           - No specimens collected.                           - No source of anemia identified on this                            examination. Recommendation:           - Patient has a contact number available for                            emergencies. The signs and symptoms of potential                            delayed complications were discussed with the                            patient. Return to normal  activities tomorrow.                            Written discharge instructions were provided to the                            patient.                           - Resume previous diet today.                           - Continue present medications.                           - Repeat colonoscopy at age 20 for screening  purposes.                           - Proceed with capsule endoscopy to further                            evaluate the unexplained iron deficiency anemia. Thornton Park MD, MD 11/14/2018 2:54:16 PM This report has been signed electronically.

## 2018-11-14 NOTE — Progress Notes (Signed)
Called to room to assist during endoscopic procedure.  Patient ID and intended procedure confirmed with present staff. Received instructions for my participation in the procedure from the performing physician. °

## 2018-11-15 ENCOUNTER — Other Ambulatory Visit: Payer: Self-pay | Admitting: *Deleted

## 2018-11-15 ENCOUNTER — Telehealth: Payer: Self-pay | Admitting: *Deleted

## 2018-11-15 ENCOUNTER — Encounter: Payer: Self-pay | Admitting: *Deleted

## 2018-11-15 DIAGNOSIS — D509 Iron deficiency anemia, unspecified: Secondary | ICD-10-CM

## 2018-11-15 NOTE — Telephone Encounter (Signed)
Called the patient to scheduled the VCE. Left detailed message telling the patient the call was schedule the patient and to call back.

## 2018-11-15 NOTE — Telephone Encounter (Addendum)
Spoke to patient who requested the VCE be performed on a Friday at the end of the month. Patient scheduled on 12/14/2018 at 8:30 am.   Amb referral sent.   Prep instructions reviewed with the patient and mailed. Patient told to call on 10/26 or 10/27 if she has not received instructions so she came get them from the office.   Capsule Endoscopy scheduling form place on Dr. Tarri Glenn desk for her to fill out.

## 2018-11-15 NOTE — Telephone Encounter (Signed)
Pt returned your cal, pls call her again

## 2018-11-16 ENCOUNTER — Telehealth: Payer: Self-pay

## 2018-11-16 NOTE — Telephone Encounter (Signed)
Thank you.

## 2018-11-16 NOTE — Telephone Encounter (Signed)
Covid-19 screening questions   Do you now or have you had a fever in the last 14 days? No.  Do you have any respiratory symptoms of shortness of breath or cough now or in the last 14 days? No.  Do you have any family members or close contacts with diagnosed or suspected Covid-19 in the past 14 days? No.  Have you been tested for Covid-19 and found to be positive? No.       Follow up Call-  Call back number 11/14/2018  Post procedure Call Back phone  # [phone]  Permission to leave phone message Yes     Patient questions:  Do you have a fever, pain , or abdominal swelling? No. Pain Score  7  Have you tolerated food without any problems? No.   Pt. Still having difficulty eating.  Pt. Not worse than prior to procedure. Have you been able to return to your normal activities? Yes.    Do you have any questions about your discharge instructions: Diet   Yes. Pt. Reports her tongue still has some swelling post procedure.   Medications  Yes.   Follow up visit  Yes.    Do you have questions or concerns about your Care? Yes.  Pt. Reports she is taking her Pantoprazole every morning.  I told pt. Dr. Tarri Glenn would call if the lab results from the procedure indicated the need to be on more medication.  Actions: * If pain score is 4 or above: Physician/ provider Notified : Joelene Millin L. Tarri Glenn, MD.

## 2018-11-21 ENCOUNTER — Encounter: Payer: Self-pay | Admitting: Gastroenterology

## 2018-12-07 ENCOUNTER — Telehealth: Payer: Self-pay | Admitting: Hematology and Oncology

## 2018-12-07 NOTE — Telephone Encounter (Signed)
[facility] transfer to [facility]. Confirmed 11/9 appointment with patient.

## 2018-12-11 ENCOUNTER — Encounter (HOSPITAL_COMMUNITY): Payer: Self-pay | Admitting: Emergency Medicine

## 2018-12-11 ENCOUNTER — Emergency Department (HOSPITAL_COMMUNITY)
Admission: EM | Admit: 2018-12-11 | Discharge: 2018-12-11 | Disposition: A | Discharge status: Home / Self Care | Payer: Medicaid Other | Attending: Emergency Medicine | Admitting: Emergency Medicine

## 2018-12-11 ENCOUNTER — Emergency Department (HOSPITAL_COMMUNITY): Payer: Medicaid Other

## 2018-12-11 ENCOUNTER — Other Ambulatory Visit: Payer: Self-pay

## 2018-12-11 DIAGNOSIS — Z79899 Other long term (current) drug therapy: Secondary | ICD-10-CM | POA: Diagnosis not present

## 2018-12-11 DIAGNOSIS — R1032 Left lower quadrant pain: Secondary | ICD-10-CM | POA: Insufficient documentation

## 2018-12-11 DIAGNOSIS — R1084 Generalized abdominal pain: Secondary | ICD-10-CM

## 2018-12-11 LAB — COMPREHENSIVE METABOLIC PANEL
ALT: 22 U/L (ref 0–44)
AST: 23 U/L (ref 15–41)
Albumin: 3.5 g/dL (ref 3.5–5.0)
Alkaline Phosphatase: 68 U/L (ref 38–126)
Anion gap: 8 (ref 5–15)
BUN: 14 mg/dL (ref 6–20)
CO2: 24 mmol/L (ref 22–32)
Calcium: 9.2 mg/dL (ref 8.9–10.3)
Chloride: 103 mmol/L (ref 98–111)
Creatinine, Ser: 0.76 mg/dL (ref 0.44–1.00)
GFR calc Af Amer: >60 mL/min (ref >60)
GFR calc non Af Amer: >60 mL/min (ref >60)
Glucose, Bld: 127 mg/dL — ABNORMAL HIGH (ref 70–99)
Potassium: 3.9 mmol/L (ref 3.5–5.1)
Sodium: 135 mmol/L (ref 135–145)
Total Bilirubin: 0.2 mg/dL — ABNORMAL LOW (ref 0.3–1.2)
Total Protein: 7.3 g/dL (ref 6.5–8.1)

## 2018-12-11 LAB — URINALYSIS, ROUTINE W REFLEX MICROSCOPIC
Bilirubin Urine: NEGATIVE
Glucose, UA: NEGATIVE mg/dL
Hgb urine dipstick: NEGATIVE
Ketones, ur: 5 mg/dL — AB
Leukocytes,Ua: NEGATIVE
Nitrite: NEGATIVE
Protein, ur: 100 mg/dL — AB
Specific Gravity, Urine: 1.032 — ABNORMAL HIGH (ref 1.005–1.030)
pH: 5 (ref 5.0–8.0)

## 2018-12-11 LAB — CBC
HCT: 34.9 % — ABNORMAL LOW (ref 36.0–46.0)
Hemoglobin: 9.7 g/dL — ABNORMAL LOW (ref 12.0–15.0)
MCH: 21.8 pg — ABNORMAL LOW (ref 26.0–34.0)
MCHC: 27.8 g/dL — ABNORMAL LOW (ref 30.0–36.0)
MCV: 78.4 fL — ABNORMAL LOW (ref 80.0–100.0)
Platelets: 411 10*3/uL — ABNORMAL HIGH (ref 150–400)
RBC: 4.45 MIL/uL (ref 3.87–5.11)
RDW: 23.6 % — ABNORMAL HIGH (ref 11.5–15.5)
WBC: 8.9 10*3/uL (ref 4.0–10.5)
nRBC: 0 % (ref 0.0–0.2)

## 2018-12-11 LAB — LIPASE, BLOOD: Lipase: 35 U/L (ref 11–51)

## 2018-12-11 LAB — I-STAT BETA HCG BLOOD, ED (MC, WL, AP ONLY): I-stat hCG, quantitative: <5 m[IU]/mL (ref <5)

## 2018-12-11 MED ORDER — SODIUM CHLORIDE 0.9 % IV BOLUS
1000.0000 mL | Freq: Once | INTRAVENOUS | Status: AC
  Administered 2018-12-11: 1000 mL via INTRAVENOUS

## 2018-12-11 MED ORDER — LORAZEPAM 0.5 MG PO TABS
0.5000 mg | ORAL_TABLET | Freq: Once | ORAL | Status: AC
  Administered 2018-12-11: 0.5 mg via ORAL
  Filled 2018-12-11: qty 1

## 2018-12-11 MED ORDER — SODIUM CHLORIDE (PF) 0.9 % IJ SOLN
INTRAMUSCULAR | Status: AC
  Administered 2018-12-11: 20:00:00
  Filled 2018-12-11: qty 50

## 2018-12-11 MED ORDER — IOHEXOL 300 MG/ML  SOLN
100.0000 mL | Freq: Once | INTRAMUSCULAR | Status: AC | PRN
  Administered 2018-12-11: 100 mL via INTRAVENOUS

## 2018-12-11 NOTE — ED Notes (Signed)
Pt was able to tolerate small sips of water.

## 2018-12-11 NOTE — ED Provider Notes (Signed)
[facility] DEPT Provider Note   CSN: [id number] Arrival date & time: 12/11/18  1219     History   Chief Complaint Chief Complaint  Patient presents with   Flank Pain    HPI Elaine Perez is a 33 y.o. female.     33 y.o female with a PMH of DM, HTN, IDA presents to the ED with a chief complaint of abdominal pain x several weeks. Patient reports she had an EGD and colonoscopy on 11/14/2018 by Dr. Tarri Glenn.  Patient reports after having this procedure she then began having some left lower quadrant pain, she describes this as a intermittent sharp pain localized to her left lower quadrant and her left flank.  Described as a crampy sensation.  She reports after the procedure took place she attributed the pain to the procedure however her symptoms have not resolved.  Patient did have a colonoscopy and EGD due to severe anemia, reports she is having menstrual cycles twice a month.  Patient's last bowel movement was earlier today without any blood in her stool.  She is currently passing gas. She also endorses some anorexia, reports she has not been eating as she bite her tongue during anesthesia and her tongue is somewhat swollen and painful to eat.  She denies any fevers, nausea, vomiting, chills, urinary symptoms. Of note, patient is currently sexually active with husband, does have a previous history of a tubal ligation.  The history is provided by the patient.    Past Medical History:  Diagnosis Date   Blood transfusion without reported diagnosis 2019   Diabetes mellitus without complication ([place])    Fibroids    Hypertension    Iron deficiency anemia 04/27/2018   Sleep apnea    Does not use CPAP    Patient Active Problem List   Diagnosis Date Noted   Iron deficiency anemia due to chronic blood loss 08/31/2018   Iron deficiency anemia 04/27/2018    Past Surgical History:  Procedure Laterality Date   GALLBLADDER SURGERY     REPEAT  CESAREAN SECTION       OB History   No obstetric history on file.      Home Medications    Prior to Admission medications   Medication Sig Start Date End Date Taking? Authorizing Provider  albuterol (VENTOLIN HFA) 108 (90 Base) MCG/ACT inhaler Inhale 2 puffs into the lungs as needed. 06/15/10  Yes [provider]  Fluticasone-Salmeterol (ADVAIR) 250-50 MCG/DOSE AEPB Inhale into the lungs. 05/23/18  Yes [provider]  Multiple Vitamins-Minerals (HAIR SKIN AND NAILS FORMULA) TABS Take 1 tablet by mouth daily.   Yes [provider]  pantoprazole (PROTONIX) 40 MG tablet Take 1 tablet (40 mg total) by mouth daily. 10/04/18  Yes Thornton Park, MD  benzonatate (TESSALON) 100 MG capsule Take 1 capsule (100 mg total) by mouth every 8 (eight) hours. Patient not taking: Reported on 12/11/2018 09/12/17   Hedges, Dellis Filbert, PA-C  naproxen (NAPROSYN) 500 MG tablet Take 1 tablet (500 mg total) by mouth 2 (two) times daily. Patient not taking: Reported on 12/11/2018 09/26/18   Horton, Barbette Hair, MD    Family History Family History  Problem Relation Age of Onset   Breast cancer Other    Heart disease Mother    Lupus Maternal Aunt    Stomach cancer Maternal Aunt    Colon cancer Neg Hx    Esophageal cancer Neg Hx    Rectal cancer Neg Hx  Social History Social History   Tobacco Use   Smoking status: Never Smoker   Smokeless tobacco: Never Used  Substance Use Topics   Alcohol use: Never    Frequency: Never   Drug use: Never     Allergies   Morphine and related   Review of Systems Review of Systems  Constitutional: Negative for chills and fever.  HENT: Negative for ear pain and sore throat.   Eyes: Negative for pain and visual disturbance.  Respiratory: Negative for cough and shortness of breath.   Cardiovascular: Negative for chest pain and palpitations.  Gastrointestinal: Positive for abdominal pain. Negative for blood in stool, diarrhea,  nausea and vomiting.  Genitourinary: Positive for flank pain. Negative for dysuria and hematuria.  Musculoskeletal: Negative for arthralgias and back pain.  Skin: Negative for color change and rash.  Neurological: Negative for seizures and syncope.  All other systems reviewed and are negative.    Physical Exam Updated Vital Signs BP (!) 160/101 (BP Location: Left Arm)    Pulse 68    Temp 99.6 F (37.6 C) (Oral)    Resp 16    LMP 12/07/2018    SpO2 99%   Physical Exam Vitals signs and nursing note reviewed.  Constitutional:      General: She is not in acute distress.    Appearance: She is well-developed.  HENT:     Head: Normocephalic and atraumatic.     Mouth/Throat:     Pharynx: No oropharyngeal exudate.  Eyes:     Pupils: Pupils are equal, round, and reactive to light.  Neck:     Musculoskeletal: Normal range of motion.  Cardiovascular:     Rate and Rhythm: Regular rhythm.     Heart sounds: Normal heart sounds.  Pulmonary:     Effort: Pulmonary effort is normal. No respiratory distress.     Breath sounds: Normal breath sounds.  Abdominal:     General: Abdomen is flat. Bowel sounds are decreased. There is distension.     Palpations: Abdomen is soft. There is no mass.     Tenderness: There is abdominal tenderness. There is no guarding.     Comments: Tenderness to location along the left lower quadrant.  There is no guarding on my exam.  Bowel sounds are diminished.  Abdomen seems somewhat distended.  Musculoskeletal:        General: No tenderness or deformity.     Right lower leg: No edema.     Left lower leg: No edema.  Skin:    General: Skin is warm and dry.  Neurological:     Mental Status: She is alert and oriented to person, place, and time.      ED Treatments / Results  Labs (all labs ordered are listed, but only abnormal results are displayed) Labs Reviewed  COMPREHENSIVE METABOLIC PANEL - Abnormal; Notable for the following components:      Result Value    Glucose, Bld 127 (*)    Total Bilirubin 0.2 (*)    All other components within normal limits  CBC - Abnormal; Notable for the following components:   Hemoglobin 9.7 (*)    HCT 34.9 (*)    MCV 78.4 (*)    MCH 21.8 (*)    MCHC 27.8 (*)    RDW 23.6 (*)    Platelets 411 (*)    All other components within normal limits  URINALYSIS, ROUTINE W REFLEX MICROSCOPIC - Abnormal; Notable for the following components:   Specific Gravity,  Urine 1.032 (*)    Ketones, ur 5 (*)    Protein, ur 100 (*)    Bacteria, UA RARE (*)    All other components within normal limits  LIPASE, BLOOD  I-STAT BETA HCG BLOOD, ED ([facility], [facility], [facility] ONLY)    EKG None  Radiology Ct Abdomen Pelvis W Contrast  Result Date: 12/11/2018 CLINICAL DATA:  33 year old female with abdominal pain. EXAM: CT ABDOMEN AND PELVIS WITH CONTRAST TECHNIQUE: Multidetector CT imaging of the abdomen and pelvis was performed using the standard protocol following bolus administration of intravenous contrast. CONTRAST:  123mL OMNIPAQUE IOHEXOL 300 MG/ML  SOLN COMPARISON:  None. FINDINGS: Lower chest: The visualized lung bases are clear. No intra-abdominal free air or free fluid. Hepatobiliary: The liver is unremarkable. No intrahepatic biliary ductal dilatation. Cholecystectomy. No retained calcified stone noted in the central CBD. Pancreas: Unremarkable. No pancreatic ductal dilatation or surrounding inflammatory changes. Spleen: Normal in size without focal abnormality. Adrenals/Urinary Tract: The adrenal glands are unremarkable. The kidneys, visualized ureters, and urinary bladder appear unremarkable. Stomach/Bowel: There is no bowel obstruction or active inflammation. The appendix is normal. Vascular/Lymphatic: The abdominal aorta and IVC are unremarkable. No portal venous gas. There is no adenopathy. Reproductive: The uterus is anteverted. The uterus is enlarged with several fibroids. The largest fibroid measures approximately 7 cm which causes mass  effect on the endometrium. There is a 2 cm right ovarian dominant follicle/corpus luteum. Other: Midline vertical anterior abdominal wall incisional scar and small fat containing incisional hernia. No fluid collection. Musculoskeletal: Lower lumbar facet arthropathy. No acute osseous pathology. IMPRESSION: 1. No acute intra-abdominal or pelvic pathology. No bowel obstruction or active inflammation. Normal appendix. 2. Enlarged fibroid uterus. Electronically Signed   By: Anner [facility] M.D.   On: 12/11/2018 19:23    Procedures Procedures (including critical care time)  Medications Ordered in ED Medications  LORazepam (ATIVAN) tablet 0.5 mg (0.5 mg Oral Given 12/11/18 1823)  sodium chloride 0.9 % bolus 1,000 mL (0 mLs Intravenous Stopped 12/11/18 1940)  sodium chloride (PF) 0.9 % injection (  Given by Other 12/11/18 1937)  iohexol (OMNIPAQUE) 300 MG/ML solution 100 mL (100 mLs Intravenous Contrast Given 12/11/18 1857)     Initial Impression / Assessment and Plan / ED Course  I have reviewed the triage vital signs and the nursing notes.  Pertinent labs & imaging results that were available during my care of the patient were reviewed by me and considered in my medical decision making (see chart for details).       Patient with no past medical history presents to the ED with complaints of abdominal pain.  She had a procedure done on November 14, 2018 for an EGD and a colonoscopy due to symptomatic anemia.  She reports has been having pain since the incident, states she has been having pain with defecation.  Most of her pain is located around the left lower quadrant region, does not have a prior history of diverticulitis.  CMP showed no electrolyte derangement, creatinine level is within normal limits.  LFTs are unremarkable.  CBC without any leukocytosis, hemoglobin is slightly improved from her previous visit, she does have a previous history of anemia.  UA does show some ketones, she has a  previous history of diabetes currently not on any medication.  There is no anion gap.  Low suspicion for any DKA.  Lipase level is within normal limits.  Patient was provided with fluids along with medication to help with her symptoms.  She [facility]  Patient reported some anxiety due to CT imaging, she was given some Ativan used for helping her relax.  No further pain medication was ordered.  A CT of her abdomen and pelvis was obtained which showed 1. No acute intra-abdominal or pelvic pathology. No bowel  obstruction or active inflammation. Normal appendix.  2. Enlarged fibroid uterus.      These results were discussed with patient at length.  She was p.o. challenged with successful completion.  She appears sleepy on my reevaluation.  Discussed these results and advise her to follow-up with OB/GYN.  She may benefit from some over-the-counter anti-inflammatories.  Patient is otherwise nontoxic appearing, vitals are within normal limits, no further episodes of vomiting, nausea or diarrhea.  Patient understands and agrees with management.  Return precautions provided at length.    Portions of this note were generated with [employer]. Dictation errors may occur despite best attempts at proofreading.  Final Clinical Impressions(s) / ED Diagnoses   Final diagnoses:  Generalized abdominal pain    ED Discharge Orders    None       Janeece Fitting, Hershal Coria 12/11/18 2017    Dorie Rank, MD 12/13/18 1441

## 2018-12-11 NOTE — ED Triage Notes (Signed)
Pt c/o flank pains that radiates towards RLQ for several days. Denies n/v/d. States urinates a lot. Went to [facility] today and was told urine was good and gave pt shot.

## 2018-12-11 NOTE — ED Notes (Signed)
Pt was verbalized discharge instructions. Pt had no further questions at this time. NAD.

## 2018-12-11 NOTE — Discharge Instructions (Addendum)
Your CT results were within normal limits today.  Please continue taking ibuprofen or Tylenol to help with your pain.  If you experience any blood in your stool, fevers, worsening symptoms please return to the emergency department.

## 2018-12-17 ENCOUNTER — Telehealth: Payer: Self-pay

## 2018-12-17 NOTE — Telephone Encounter (Signed)
Left message for pt to call back regarding capsule endo appt that she missed Friday 10/30 and to see if she wants to reschedule.

## 2018-12-18 NOTE — Telephone Encounter (Signed)
Left another message for pt to call back if she wants to reschedule capsule endo.

## 2018-12-20 NOTE — Telephone Encounter (Signed)
Letter mailed to pt as have been unable to reach her by phone. Instructed her to contact the office to reschedule the Capsule endo.

## 2018-12-24 ENCOUNTER — Inpatient Hospital Stay: Payer: Medicaid Other

## 2018-12-24 ENCOUNTER — Telehealth: Payer: Self-pay | Admitting: Hematology and Oncology

## 2018-12-24 ENCOUNTER — Other Ambulatory Visit: Payer: Self-pay

## 2018-12-24 ENCOUNTER — Other Ambulatory Visit: Payer: Self-pay | Admitting: Hematology and Oncology

## 2018-12-24 ENCOUNTER — Inpatient Hospital Stay: Payer: Medicaid Other | Attending: Internal Medicine | Admitting: Hematology and Oncology

## 2018-12-24 VITALS — BP 138/76 | HR 85 | Temp 98.7°F | Resp 18 | Ht 61.0 in | Wt 237.9 lb

## 2018-12-24 DIAGNOSIS — D500 Iron deficiency anemia secondary to blood loss (chronic): Secondary | ICD-10-CM

## 2018-12-24 DIAGNOSIS — N939 Abnormal uterine and vaginal bleeding, unspecified: Secondary | ICD-10-CM | POA: Diagnosis not present

## 2018-12-24 DIAGNOSIS — Z23 Encounter for immunization: Secondary | ICD-10-CM | POA: Diagnosis not present

## 2018-12-24 LAB — CMP (CANCER CENTER ONLY)
ALT: 17 U/L (ref 0–44)
AST: 14 U/L — ABNORMAL LOW (ref 15–41)
Albumin: 3.4 g/dL — ABNORMAL LOW (ref 3.5–5.0)
Alkaline Phosphatase: 75 U/L (ref 38–126)
Anion gap: 9 (ref 5–15)
BUN: 18 mg/dL (ref 6–20)
CO2: 24 mmol/L (ref 22–32)
Calcium: 8.9 mg/dL (ref 8.9–10.3)
Chloride: 109 mmol/L (ref 98–111)
Creatinine: 0.97 mg/dL (ref 0.44–1.00)
GFR, Est AFR Am: >60 mL/min (ref >60)
GFR, Estimated: >60 mL/min (ref >60)
Glucose, Bld: 100 mg/dL — ABNORMAL HIGH (ref 70–99)
Potassium: 3.9 mmol/L (ref 3.5–5.1)
Sodium: 142 mmol/L (ref 135–145)
Total Bilirubin: <0.2 mg/dL — ABNORMAL LOW (ref 0.3–1.2)
Total Protein: 7.1 g/dL (ref 6.5–8.1)

## 2018-12-24 LAB — CBC WITH DIFFERENTIAL (CANCER CENTER ONLY)
Abs Immature Granulocytes: 0.01 10*3/uL (ref 0.00–0.07)
Basophils Absolute: 0.1 10*3/uL (ref 0.0–0.1)
Basophils Relative: 1 %
Eosinophils Absolute: 0.5 10*3/uL (ref 0.0–0.5)
Eosinophils Relative: 6 %
HCT: 32.4 % — ABNORMAL LOW (ref 36.0–46.0)
Hemoglobin: 9.3 g/dL — ABNORMAL LOW (ref 12.0–15.0)
Immature Granulocytes: 0 %
Lymphocytes Relative: 42 %
Lymphs Abs: 3.7 10*3/uL (ref 0.7–4.0)
MCH: 22 pg — ABNORMAL LOW (ref 26.0–34.0)
MCHC: 28.7 g/dL — ABNORMAL LOW (ref 30.0–36.0)
MCV: 76.8 fL — ABNORMAL LOW (ref 80.0–100.0)
Monocytes Absolute: 0.9 10*3/uL (ref 0.1–1.0)
Monocytes Relative: 10 %
Neutro Abs: 3.5 10*3/uL (ref 1.7–7.7)
Neutrophils Relative %: 41 %
Platelet Count: 407 10*3/uL — ABNORMAL HIGH (ref 150–400)
RBC: 4.22 MIL/uL (ref 3.87–5.11)
RDW: 22.2 % — ABNORMAL HIGH (ref 11.5–15.5)
WBC Count: 8.6 10*3/uL (ref 4.0–10.5)
nRBC: 0 % (ref 0.0–0.2)

## 2018-12-24 LAB — IRON AND TIBC
Iron: 38 ug/dL — ABNORMAL LOW (ref 41–142)
Saturation Ratios: 11 % — ABNORMAL LOW (ref 21–57)
TIBC: 350 ug/dL (ref 236–444)
UIBC: 312 ug/dL (ref 120–384)

## 2018-12-24 LAB — SAVE SMEAR(SSMR), FOR PROVIDER SLIDE REVIEW

## 2018-12-24 LAB — FERRITIN: Ferritin: 6 ng/mL — ABNORMAL LOW (ref 11–307)

## 2018-12-24 MED ORDER — INFLUENZA VAC SPLIT QUAD 0.5 ML IM SUSY
0.5000 mL | PREFILLED_SYRINGE | Freq: Once | INTRAMUSCULAR | Status: AC
  Administered 2018-12-24: 0.5 mL via INTRAMUSCULAR

## 2018-12-24 MED ORDER — INFLUENZA VAC SPLIT QUAD 0.5 ML IM SUSY
PREFILLED_SYRINGE | INTRAMUSCULAR | Status: AC
  Filled 2018-12-24: qty 0.5

## 2018-12-24 NOTE — Telephone Encounter (Signed)
Pt called back and stated that she had not received any paperwork regarding her appointment.  She would like to reschedule.

## 2018-12-24 NOTE — Telephone Encounter (Signed)
Scheduled appt per 11/9 sch message -unable to reach pt . Left message with appt date and time

## 2018-12-24 NOTE — Progress Notes (Signed)
[place] [phone]   [phone]  PROGRESS NOTE  Patient Care Team: [name], MD as PCP - General (Internal Medicine)  Hematological/Oncological History # Iron Deficiency Anemia A) 33 year old female referred for evaluation due to persistent iron deficiency anemia.  Pt was seen at [facility] and was treated in the past with IV iron. Pt has an intolerance for oral iron.  She has been told she has uterine fibroids. B) Feraheme infusions on 05/04/18, 06/01/18, and 11/09/18 C) 12/24/2018: establish care with Dr. Lorenso Courier   Interval History:  Elaine Perez 33 y.o. female presents as a follow up for iron deficiency anemia. She was last seen in our clinic by Dr. Walden Field in July 2020. Since that time Elaine Perez has not had any further iron treatments or taken any PO iron therapy.   In the interim she has continued to have heavy GYN bleeding. She reports periods that occur every 18 days and last for 8 days of bleeding. During these periods she can go through 2.5 bags (20 per bag) of pads that are saturated with blood. These periods are quite painful. She reportedly was offered a partial hysterectomy by GYN, however became frightened by that treatment and was lost to f/u with her local GYN service. She last saw them in Jan 2020.   A 10 point ROS is reviewed below.   MEDICAL HISTORY:  Past Medical History:  Diagnosis Date   Blood transfusion without reported diagnosis 2019   Diabetes mellitus without complication (HCC)    Fibroids    Hypertension    Iron deficiency anemia 04/27/2018   Sleep apnea    Does not use CPAP    SURGICAL HISTORY: Past Surgical History:  Procedure Laterality Date   GALLBLADDER SURGERY     REPEAT CESAREAN SECTION      SOCIAL HISTORY: Social History   Socioeconomic History   Marital status: Married    Spouse name: Not on file   Number of children: 3   Years of education: Not on file    Highest education level: Not on file  Occupational History   Not on file  Social Needs   Financial resource strain: Not on file   Food insecurity    Worry: Not on file    Inability: Not on file   Transportation needs    Medical: Not on file    Non-medical: Not on file  Tobacco Use   Smoking status: Never Smoker   Smokeless tobacco: Never Used  Substance and Sexual Activity   Alcohol use: Never    Frequency: Never   Drug use: Never   Sexual activity: Not on file  Lifestyle   Physical activity    Days per week: Not on file    Minutes per session: Not on file   Stress: Not on file  Relationships   Social connections    Talks on phone: Not on file    Gets together: Not on file    Attends religious service: Not on file    Active member of club or organization: Not on file    Attends meetings of clubs or organizations: Not on file    Relationship status: Not on file   Intimate partner violence    Fear of current or ex partner: Not on file    Emotionally abused: Not on file    Physically abused: Not on file    Forced sexual activity: Not on file  Other Topics Concern  Not on file  Social History Narrative   Not on file    FAMILY HISTORY: Family History  Problem Relation Age of Onset   Breast cancer Other    Heart disease Mother    Lupus Maternal Aunt    Stomach cancer Maternal Aunt    Colon cancer Neg Hx    Esophageal cancer Neg Hx    Rectal cancer Neg Hx     ALLERGIES:  is allergic to morphine and related.  MEDICATIONS:  Current Outpatient Medications  Medication Sig Dispense Refill   albuterol (VENTOLIN HFA) 108 (90 Base) MCG/ACT inhaler Inhale 2 puffs into the lungs as needed.     benzonatate (TESSALON) 100 MG capsule Take 1 capsule (100 mg total) by mouth every 8 (eight) hours. (Patient not taking: Reported on 12/11/2018) 21 capsule 0   Fluticasone-Salmeterol (ADVAIR) 250-50 MCG/DOSE AEPB Inhale into the lungs.     Multiple  Vitamins-Minerals (HAIR SKIN AND NAILS FORMULA) TABS Take 1 tablet by mouth daily.     naproxen (NAPROSYN) 500 MG tablet Take 1 tablet (500 mg total) by mouth 2 (two) times daily. (Patient not taking: Reported on 12/11/2018) 30 tablet 0   pantoprazole (PROTONIX) 40 MG tablet Take 1 tablet (40 mg total) by mouth daily. 30 tablet 3   No current facility-administered medications for this visit.     REVIEW OF SYSTEMS:   Constitutional: ( - ) fevers, ( - )  chills , ( - ) night sweats Eyes: ( - ) blurriness of vision, ( - ) double vision, ( - ) watery eyes Ears, nose, mouth, throat, and face: ( - ) mucositis, ( - ) sore throat Respiratory: ( - ) cough, ( - ) dyspnea, ( - ) wheezes Cardiovascular: ( - ) palpitation, ( - ) chest discomfort, ( - ) lower extremity swelling Gastrointestinal:  ( - ) nausea, ( - ) heartburn, ( - ) change in bowel habits Skin: ( - ) abnormal skin rashes Lymphatics: ( - ) new lymphadenopathy, ( - ) easy bruising Neurological: ( - ) numbness, ( - ) tingling, ( - ) new weaknesses Behavioral/Psych: ( - ) mood change, ( - ) new changes  All other systems were reviewed with the patient and are negative.  PHYSICAL EXAMINATION: ECOG PERFORMANCE STATUS: 1 - Symptomatic but completely ambulatory  Vitals:   12/24/18 1158  BP: 138/76  Pulse: 85  Resp: 18  Temp: 98.7 F (37.1 C)  SpO2: 100%   Filed Weights   12/24/18 1158  Weight: 237 lb 14.4 oz (107.9 kg)    GENERAL: well appearing obese African American female in NAD  SKIN: skin color, texture, turgor are normal, no rashes or significant lesions EYES: conjunctiva are pale and non-injected, sclera clear LUNGS: clear to auscultation and percussion with normal breathing effort HEART: regular rate & rhythm and no murmurs and no lower extremity edema ABDOMEN: soft, non-tender, non-distended, normal bowel sounds Musculoskeletal: no cyanosis of digits and no clubbing  PSYCH: alert & oriented x 3, fluent speech NEURO:  no focal motor/sensory deficits  LABORATORY DATA:  I have reviewed the data as listed Lab Results  Component Value Date   WBC 8.6 12/24/2018   HGB 9.3 (L) 12/24/2018   HCT 32.4 (L) 12/24/2018   MCV 76.8 (L) 12/24/2018   PLT 407 (H) 12/24/2018   NEUTROABS 3.5 12/24/2018    BLOOD FILM:  I personally reviewed the patient's peripheral blood smear today.  There was no peripheral blast.  The white  blood cells showed some neutrophils with 5 segmented nuclei. Red blood cells were of normal morphology with no schistocytosis. Some Anisocytosis but no poikilocytosis.  The platelets are of normal size and I have verified that there were no platelet clumping.  RADIOGRAPHIC STUDIES: I have personally reviewed the radiological images as listed and agreed with the findings in the report: large fibroids evident on CT scan.   Ct Abdomen Pelvis W Contrast  Result Date: 12/11/2018 CLINICAL DATA:  33 year old female with abdominal pain. EXAM: CT ABDOMEN AND PELVIS WITH CONTRAST TECHNIQUE: Multidetector CT imaging of the abdomen and pelvis was performed using the standard protocol following bolus administration of intravenous contrast. CONTRAST:  140mL OMNIPAQUE IOHEXOL 300 MG/ML  SOLN COMPARISON:  None. FINDINGS: Lower chest: The visualized lung bases are clear. No intra-abdominal free air or free fluid. Hepatobiliary: The liver is unremarkable. No intrahepatic biliary ductal dilatation. Cholecystectomy. No retained calcified stone noted in the central CBD. Pancreas: Unremarkable. No pancreatic ductal dilatation or surrounding inflammatory changes. Spleen: Normal in size without focal abnormality. Adrenals/Urinary Tract: The adrenal glands are unremarkable. The kidneys, visualized ureters, and urinary bladder appear unremarkable. Stomach/Bowel: There is no bowel obstruction or active inflammation. The appendix is normal. Vascular/Lymphatic: The abdominal aorta and IVC are unremarkable. No portal venous gas. There  is no adenopathy. Reproductive: The uterus is anteverted. The uterus is enlarged with several fibroids. The largest fibroid measures approximately 7 cm which causes mass effect on the endometrium. There is a 2 cm right ovarian dominant follicle/corpus luteum. Other: Midline vertical anterior abdominal wall incisional scar and small fat containing incisional hernia. No fluid collection. Musculoskeletal: Lower lumbar facet arthropathy. No acute osseous pathology. IMPRESSION: 1. No acute intra-abdominal or pelvic pathology. No bowel obstruction or active inflammation. Normal appendix. 2. Enlarged fibroid uterus. Electronically Signed   By: Anner Crete M.D.   On: 12/11/2018 19:23    ASSESSMENT & PLAN Elaine Perez 33 y.o. female presents as a follow up for iron deficiency anemia. Since her last visit she has no had any further f/u with GYN. Today I strongly emphasized that iron can help her build back up her counts ,but as long as she has heavy GYN blood loss then we will never catch up. She has classical symptoms of iron deficiency including fatigue and ice cravings. She also has a reported intolerance to PO iron. As such we will plan to move forward with IV iron.   The source of her blood loss at this time appears clear. If she were to have  Hysterectomy (partial or total) for her fibroids with continued drop in Hgb we would need to consider alterative etiologies. At this time she has no overt signs of other blood loss. We will plan to assist with iron repletion, however control over the source of blood loss is essential for achieving a stable Hgb.   #Iron Deficiency Anemia 2/2 to Chronic Blood Loss --recheck CBC and iron panel today --tenative plan to restart IV iron with feraheme on 01/04/19 with dose #2 on 01/11/19 (patient notes she is OK with the day after Thanksgiving) --strongly encourage patient to reconnect with her GYN team to assist in getting her bleeding under control.  --RTC in 2 months  to assess response to iron therapy   All questions were answered. The patient knows to call the clinic with any problems, questions or concerns.  A total of more than 25 minutes were spent face-to-face with the patient during this encounter and over half of that  time was spent on counseling and coordination of care as outlined above.   Ledell Peoples, MD Department of Hematology/Oncology [place] [facility] Phone: [phone] Pager: [phone] Email: [email or web address]   12/24/2018 6:42 PM

## 2018-12-24 NOTE — Telephone Encounter (Signed)
Left message for pt to call back.

## 2018-12-25 NOTE — Telephone Encounter (Signed)
Pt rescheduled capsule endo to 11/18, instructions left up front for pt to pick up.

## 2018-12-26 ENCOUNTER — Telehealth: Payer: Self-pay | Admitting: *Deleted

## 2018-12-26 ENCOUNTER — Other Ambulatory Visit: Payer: Self-pay | Admitting: Hematology and Oncology

## 2018-12-26 NOTE — Telephone Encounter (Signed)
Received call from patient. She is on her way to see her OB-GYN doctor in [place] , [place]. She is requesting her labs and MDc appt notes to be fax'd to that office. The office is [facility] in [place], [place]   The fax # is [phone].  Records were fax'd

## 2019-01-01 ENCOUNTER — Emergency Department (HOSPITAL_COMMUNITY)
Admission: EM | Admit: 2019-01-01 | Discharge: 2019-01-01 | Disposition: A | Discharge status: Home / Self Care | Payer: Medicaid Other | Attending: Emergency Medicine | Admitting: Emergency Medicine

## 2019-01-01 ENCOUNTER — Other Ambulatory Visit: Payer: Self-pay

## 2019-01-01 ENCOUNTER — Emergency Department (HOSPITAL_COMMUNITY): Payer: Medicaid Other

## 2019-01-01 DIAGNOSIS — R109 Unspecified abdominal pain: Secondary | ICD-10-CM | POA: Diagnosis present

## 2019-01-01 DIAGNOSIS — I10 Essential (primary) hypertension: Secondary | ICD-10-CM | POA: Insufficient documentation

## 2019-01-01 DIAGNOSIS — E119 Type 2 diabetes mellitus without complications: Secondary | ICD-10-CM | POA: Insufficient documentation

## 2019-01-01 DIAGNOSIS — D259 Leiomyoma of uterus, unspecified: Secondary | ICD-10-CM | POA: Insufficient documentation

## 2019-01-01 DIAGNOSIS — Z79899 Other long term (current) drug therapy: Secondary | ICD-10-CM | POA: Diagnosis not present

## 2019-01-01 DIAGNOSIS — R1030 Lower abdominal pain, unspecified: Secondary | ICD-10-CM | POA: Diagnosis not present

## 2019-01-01 DIAGNOSIS — R102 Pelvic and perineal pain: Secondary | ICD-10-CM

## 2019-01-01 DIAGNOSIS — D219 Benign neoplasm of connective and other soft tissue, unspecified: Secondary | ICD-10-CM

## 2019-01-01 LAB — CBC
HCT: 34.2 % — ABNORMAL LOW (ref 36.0–46.0)
Hemoglobin: 9.5 g/dL — ABNORMAL LOW (ref 12.0–15.0)
MCH: 22.1 pg — ABNORMAL LOW (ref 26.0–34.0)
MCHC: 27.8 g/dL — ABNORMAL LOW (ref 30.0–36.0)
MCV: 79.7 fL — ABNORMAL LOW (ref 80.0–100.0)
Platelets: 530 10*3/uL — ABNORMAL HIGH (ref 150–400)
RBC: 4.29 MIL/uL (ref 3.87–5.11)
RDW: 21.4 % — ABNORMAL HIGH (ref 11.5–15.5)
WBC: 9 10*3/uL (ref 4.0–10.5)
nRBC: 0.3 % — ABNORMAL HIGH (ref 0.0–0.2)

## 2019-01-01 LAB — URINALYSIS, ROUTINE W REFLEX MICROSCOPIC
Bacteria, UA: NONE SEEN
Bilirubin Urine: NEGATIVE
Glucose, UA: NEGATIVE mg/dL
Ketones, ur: NEGATIVE mg/dL
Leukocytes,Ua: NEGATIVE
Nitrite: NEGATIVE
Protein, ur: NEGATIVE mg/dL
Specific Gravity, Urine: 1.018 (ref 1.005–1.030)
pH: 5 (ref 5.0–8.0)

## 2019-01-01 LAB — COMPREHENSIVE METABOLIC PANEL
ALT: 20 U/L (ref 0–44)
AST: 20 U/L (ref 15–41)
Albumin: 3.3 g/dL — ABNORMAL LOW (ref 3.5–5.0)
Alkaline Phosphatase: 60 U/L (ref 38–126)
Anion gap: 7 (ref 5–15)
BUN: 11 mg/dL (ref 6–20)
CO2: 22 mmol/L (ref 22–32)
Calcium: 8.9 mg/dL (ref 8.9–10.3)
Chloride: 106 mmol/L (ref 98–111)
Creatinine, Ser: 0.79 mg/dL (ref 0.44–1.00)
GFR calc Af Amer: >60 mL/min (ref >60)
GFR calc non Af Amer: >60 mL/min (ref >60)
Glucose, Bld: 101 mg/dL — ABNORMAL HIGH (ref 70–99)
Potassium: 4.1 mmol/L (ref 3.5–5.1)
Sodium: 135 mmol/L (ref 135–145)
Total Bilirubin: 0.3 mg/dL (ref 0.3–1.2)
Total Protein: 7.2 g/dL (ref 6.5–8.1)

## 2019-01-01 LAB — LIPASE, BLOOD: Lipase: 29 U/L (ref 11–51)

## 2019-01-01 LAB — I-STAT BETA HCG BLOOD, ED (MC, WL, AP ONLY): I-stat hCG, quantitative: <5 m[IU]/mL (ref <5)

## 2019-01-01 MED ORDER — ONDANSETRON HCL 4 MG/2ML IJ SOLN
4.0000 mg | Freq: Once | INTRAMUSCULAR | Status: AC
  Administered 2019-01-01: 4 mg via INTRAVENOUS
  Filled 2019-01-01: qty 2

## 2019-01-01 MED ORDER — HYDROMORPHONE HCL 1 MG/ML IJ SOLN
0.5000 mg | Freq: Once | INTRAMUSCULAR | Status: AC
  Administered 2019-01-01: 0.5 mg via INTRAVENOUS
  Filled 2019-01-01: qty 1

## 2019-01-01 MED ORDER — KETOROLAC TROMETHAMINE 15 MG/ML IJ SOLN: 15.0000 mg | Freq: Once | INTRAMUSCULAR | Status: DC

## 2019-01-01 MED ORDER — NAPROXEN 500 MG PO TABS: 500.0000 mg | ORAL_TABLET | Freq: Two times a day (BID) | ORAL | 0 refills | Status: AC

## 2019-01-01 MED ORDER — OXYCODONE-ACETAMINOPHEN 5-325 MG PO TABS
1.0000 | ORAL_TABLET | ORAL | Status: DC | PRN
  Administered 2019-01-01: 1 via ORAL
  Filled 2019-01-01: qty 1

## 2019-01-01 NOTE — Discharge Instructions (Signed)
Your ultrasound today show multiple fibroids.  Please call your OB/GYN office in order to schedule this appointment sooner than December.  I have provided a prescription for some anti-inflammatories, please take 1 tablet twice a day for the next 7 days.

## 2019-01-01 NOTE — ED Triage Notes (Signed)
Patient to ER for evaluation of generalized lower abdominal pain reports she believes her fibroids are "flaring up," has history of same. Reports recent menstrual cycle just completed. Reports "irritation" with urination. Denies vaginal discharge. Denies new partners.

## 2019-01-01 NOTE — ED Notes (Signed)
After chart review, it seems patient has had a lot of abdominal pain since having a colonoscopy completed two months ago. She appears very uncomfortable. Tearful. Per MD Tegeler, able to give PO percocet given her allergy to morphine that she reports is a rash. She reports has tolerated percocet in the past.

## 2019-01-01 NOTE — ED Notes (Signed)
Patient transported to Ultrasound 

## 2019-01-01 NOTE — ED Notes (Signed)
Patient Alert and oriented to baseline. Stable and ambulatory to baseline. Patient verbalized understanding of the discharge instructions.  Patient belongings were taken by the patient.   

## 2019-01-01 NOTE — ED Provider Notes (Signed)
[facility] EMERGENCY DEPARTMENT Provider Note   CSN: [id number] Arrival date & time: 01/01/19  [id number]     History   Chief Complaint Chief Complaint  Patient presents with   Abdominal Pain    HPI Elaine Perez is a 33 y.o. female.     33 y.o female with a PMH of DM, HTN, IDA presents to the ED with a chief complaint of suprapubic pain x 2 days. Patient was seen in the ED by me last month for a similar complaint. Today she reports this pain has worsen feels like "labor pains" with radiation to her legs. She states the pain began after her menstrual cycle finished. This was exacerbated from her previous episode.  She does report taking some Aleve which usually help with her symptoms without improvement.  Also received Percocet in the ED but reports this did not help with her pain.  She immediately vomited after this incident, also endorses nausea with the pain.  Patient is scheduled for a biopsy for likely an endometriosis diagnosis by GYN but this is not until December 4.  Denies any fever, diarrhea, Furnace of breath or urinary complaints.  Of note, patient does have a surgical history of 3 C-sections along with a cholecystectomy.  The history is provided by the patient and medical records.  Abdominal Pain Associated symptoms: nausea and vomiting   Associated symptoms: no chest pain, no chills, no cough, no dysuria, no fever, no hematuria, no shortness of breath, no sore throat, no vaginal bleeding and no vaginal discharge     Past Medical History:  Diagnosis Date   Blood transfusion without reported diagnosis 2019   Diabetes mellitus without complication ([place])    Fibroids    Hypertension    Iron deficiency anemia 04/27/2018   Sleep apnea    Does not use CPAP    Patient Active Problem List   Diagnosis Date Noted   Iron deficiency anemia due to chronic blood loss 08/31/2018   Iron deficiency anemia 04/27/2018    Past Surgical History:  Procedure  Laterality Date   GALLBLADDER SURGERY     REPEAT CESAREAN SECTION       OB History   No obstetric history on file.      Home Medications    Prior to Admission medications   Medication Sig Start Date End Date Taking? Authorizing Provider  albuterol (VENTOLIN HFA) 108 (90 Base) MCG/ACT inhaler Inhale 2 puffs into the lungs as needed. 06/15/10   [provider]  benzonatate (TESSALON) 100 MG capsule Take 1 capsule (100 mg total) by mouth every 8 (eight) hours. Patient not taking: Reported on 12/11/2018 09/12/17   Hedges, Dellis Filbert, PA-C  Fluticasone-Salmeterol (ADVAIR) 250-50 MCG/DOSE AEPB Inhale into the lungs. 05/23/18   [provider]  Multiple Vitamins-Minerals (HAIR SKIN AND NAILS FORMULA) TABS Take 1 tablet by mouth daily.    [provider]  naproxen (NAPROSYN) 500 MG tablet Take 1 tablet (500 mg total) by mouth 2 (two) times daily for 7 days. 01/01/19 01/08/19  Janeece Fitting, PA-C  pantoprazole (PROTONIX) 40 MG tablet Take 1 tablet (40 mg total) by mouth daily. 10/04/18   Thornton Park, MD    Family History Family History  Problem Relation Age of Onset   Breast cancer Other    Heart disease Mother    Lupus Maternal Aunt    Stomach cancer Maternal Aunt    Colon cancer Neg Hx    Esophageal cancer Neg Hx  Rectal cancer Neg Hx     Social History Social History   Tobacco Use   Smoking status: Never Smoker   Smokeless tobacco: Never Used  Substance Use Topics   Alcohol use: Never    Frequency: Never   Drug use: Never     Allergies   Morphine and related   Review of Systems Review of Systems  Constitutional: Negative for chills and fever.  HENT: Negative for ear pain and sore throat.   Eyes: Negative for pain and visual disturbance.  Respiratory: Negative for cough and shortness of breath.   Cardiovascular: Negative for chest pain and palpitations.  Gastrointestinal: Positive for abdominal pain, nausea and vomiting.   Genitourinary: Positive for vaginal pain. Negative for dysuria, flank pain, hematuria, vaginal bleeding and vaginal discharge.  Musculoskeletal: Negative for arthralgias and back pain.  Skin: Negative for color change and rash.  Neurological: Negative for seizures and syncope.  All other systems reviewed and are negative.    Physical Exam Updated Vital Signs BP 138/86    Pulse 64    Temp 98 F (36.7 C) (Oral)    Resp 16    Ht 5\' 1"  (1.549 m)    Wt 107.9 kg    LMP 12/24/2018    SpO2 97%    BMI 44.95 kg/m   Physical Exam Vitals signs and nursing note reviewed.  Constitutional:      General: She is not in acute distress.    Appearance: She is well-developed.     Comments: Appears uncomfortable.  HENT:     Head: Normocephalic and atraumatic.     Mouth/Throat:     Pharynx: No oropharyngeal exudate.  Eyes:     Pupils: Pupils are equal, round, and reactive to light.  Neck:     Musculoskeletal: Normal range of motion.  Cardiovascular:     Rate and Rhythm: Regular rhythm.     Heart sounds: Normal heart sounds.  Pulmonary:     Effort: Pulmonary effort is normal. No respiratory distress.     Breath sounds: Normal breath sounds.  Abdominal:     General: Bowel sounds are normal. There is no distension.     Palpations: Abdomen is soft.     Tenderness: There is abdominal tenderness in the right lower quadrant, suprapubic area and left lower quadrant. There is guarding. There is no rebound. Negative signs include McBurney's sign.     Comments: Bowel sounds are present.  Abdomen is soft, tender to palpation along the suprapubic area.  There is mild guarding noted.  Musculoskeletal:        General: No tenderness or deformity.     Right lower leg: No edema.     Left lower leg: No edema.  Skin:    General: Skin is warm and dry.  Neurological:     Mental Status: She is alert and oriented to person, place, and time.      ED Treatments / Results  Labs (all labs ordered are listed, but  only abnormal results are displayed) Labs Reviewed  COMPREHENSIVE METABOLIC PANEL - Abnormal; Notable for the following components:      Result Value   Glucose, Bld 101 (*)    Albumin 3.3 (*)    All other components within normal limits  CBC - Abnormal; Notable for the following components:   Hemoglobin 9.5 (*)    HCT 34.2 (*)    MCV 79.7 (*)    MCH 22.1 (*)    MCHC 27.8 (*)  RDW 21.4 (*)    Platelets 530 (*)    nRBC 0.3 (*)    All other components within normal limits  URINALYSIS, ROUTINE W REFLEX MICROSCOPIC - Abnormal; Notable for the following components:   Hgb urine dipstick SMALL (*)    All other components within normal limits  LIPASE, BLOOD  I-STAT BETA HCG BLOOD, ED ([facility], [facility], [facility] ONLY)    EKG None  Radiology US Pelvic Complete W Transvaginal And Torsion R/o  Result Date: 01/01/2019 CLINICAL DATA:  Pelvic pain at for 1 week, history of fibroids, tubal ligation, and Caesarean section EXAM: TRANSABDOMINAL AND TRANSVAGINAL ULTRASOUND OF PELVIS DOPPLER ULTRASOUND OF OVARIES TECHNIQUE: Both transabdominal and transvaginal ultrasound examinations of the pelvis were performed. Transabdominal technique was performed for global imaging of the pelvis including uterus, ovaries, adnexal regions, and pelvic cul-de-sac. It was necessary to proceed with endovaginal exam following the transabdominal exam to visualize the endometrium and ovaries. Color and duplex Doppler ultrasound was utilized to evaluate blood flow to the ovaries. COMPARISON:  None Correlation: CT abdomen and pelvis 12/11/2018 FINDINGS: Uterus Measurements: 13.0 x 8.7 x 7.2 cm = volume: 428 mL. Anteverted. Nabothian cysts at cervix. Small posterior wall subserosal leiomyoma 2.7 x 1.6 x 3.1 cm. Additional anterior wall leiomyoma 2.9 x 2.3 x 2.8 cm, intramural. Central leiomyoma at upper uterus 5.8 x 4.4 x 5.6 cm, almost certainly extending submucosal. Additional probable large leiomyoma at the upper RIGHT uterus, poorly  visualized in extent on ultrasound due to bowel, suspected at least 7.1 cm in diameter; this was better demonstrated on the recent CT. Endometrium Inadequately visualized due to presence of large leiomyomata Right ovary Measurements: 3.3 x 2.2 x 2.3 cm = volume: 8.4 mL. Normal morphology without mass. Left ovary Not visualized on either transabdominal or endovaginal imaging, likely obscured by bowel Pulsed Doppler evaluation of both ovaries demonstrates presence of a venous waveform at the margin of the RIGHT ovary. Technically limited assessment of the RIGHT ovary due to depth of lesion, unable to obtain adequate arterial waveform transabdominally and inadequate visualization of RIGHT ovary transvaginally. RIGHT ovary is not seen for assessment Other findings No free pelvic fluid. No definite adnexal masses. Small hyperechoic nodule at the cervix likely an atypical nabothian cyst. IMPRESSION: Enlarged uterus containing multiple leiomyomata, including a large leiomyoma exophytic at the RIGHT fundus which is suboptimally visualized sonographically but better demonstrated by recent CT. Normal appearing RIGHT ovary with nonvisualization of LEFT ovary. Suboptimal duplex vascular ultrasound assessment of the RIGHT ovary, with a venous waveform identified but no definite arterial waveform is seen though this is felt to be artifactual in origin due to limited visualization rather than absent arterial flow. Electronically Signed   By: Lavonia Dana M.D.   On: 01/01/2019 12:47    Procedures Procedures (including critical care time)  Medications Ordered in ED Medications  ketorolac (TORADOL) 15 MG/ML injection 15 mg (0 mg Intravenous Hold 01/01/19 1354)  ondansetron (ZOFRAN) injection 4 mg (4 mg Intravenous Given 01/01/19 1138)  HYDROmorphone (DILAUDID) injection 0.5 mg (0.5 mg Intravenous Given 01/01/19 1138)     Initial Impression / Assessment and Plan / ED Course  I have reviewed the triage vital signs and the  nursing notes.  Pertinent labs & imaging results that were available during my care of the patient were reviewed by me and considered in my medical decision making (see chart for details).   Patient with a past medical history of abdominal pain presents to the ED with complaints  of suprapubic pain which has been going on for the past 2 days.  Patient was seen in the ED approximately a month ago, reports this was similar pain however has not had any GYN follow-up.  According to patient she does have an schedule appointment at the beginning of December to have an endometrial biopsy to likely look into her endometriosis.  It looks like according to records patient does have an appointment in [facility], she is currently living in [place].  She has been taking Aleve for her pain without improvement in symptoms.  Patient does have a CT from her previous visit on December 11, 2018, the CT did not show any inflammatory process, no obstruction, appendicitis, changes consistent with any other acute process.  After my evaluation patient appears uncomfortable, she was given a Percocet in the waiting room however vomited after getting it.  She also reports pain is worse with lying flat.  She does not have any chest pain, shortness of breath, swelling to her legs.  Denies any vaginal bleeding, vaginal discharge.  CBC without any leukocytosis, hemoglobin is 9.5 from improved from her previous visit.  CMP without any electrolyte abnormalities.  Kidney function is within normal limits.  LFTs are unremarkable.  UA shows small amount of hemoglobin, no white blood cell count, bacteria, nitrites.  She does not have any urinary symptoms.  Reports her menstrual period last ended yesterday landing the regular amount.  She does not have any vaginal discharge, vaginal bleeding.    A pelvic ultrasound was ordered which showed: Enlarged uterus containing multiple leiomyomata, including a large  leiomyoma exophytic  at the RIGHT fundus which is suboptimally  visualized sonographically but better demonstrated by recent CT.    Normal appearing RIGHT ovary with nonvisualization of LEFT ovary.    Suboptimal duplex vascular ultrasound assessment of the RIGHT ovary,  with a venous waveform identified but no definite arterial waveform  is seen though this is felt to be artifactual in origin due to  limited visualization rather than absent arterial flow.      1:44 PM Spoke to GYN  On call who recommended patient be follow up outpatient, will have patient attempt to contact her OB/GYN in order to move this appointment to the incoming weeks rather than in December.  Results were discussed with patient at length, she reports pain has improved after 0.5 of Dilaudid, no further vomiting episodes in the ED.  Vitals are within normal limits, she remains afebrile.  Patient is otherwise stable for discharge.  She understands and agrees with management.   Portions of this note were generated with [employer]. Dictation errors may occur despite best attempts at proofreading.  Final Clinical Impressions(s) / ED Diagnoses   Final diagnoses:  Lower abdominal pain  Leiomyoma    ED Discharge Orders         Ordered    naproxen (NAPROSYN) 500 MG tablet  2 times daily     01/01/19 1416           Janeece Fitting, PA-C 01/01/19 1436    Tegeler, Gwenyth Allegra, MD 01/01/19 1630

## 2019-01-04 ENCOUNTER — Other Ambulatory Visit: Payer: Self-pay

## 2019-01-04 ENCOUNTER — Inpatient Hospital Stay: Payer: Medicaid Other

## 2019-01-04 VITALS — BP 160/80 | HR 61 | Temp 97.9°F | Resp 16

## 2019-01-04 DIAGNOSIS — D500 Iron deficiency anemia secondary to blood loss (chronic): Secondary | ICD-10-CM | POA: Diagnosis not present

## 2019-01-04 MED ORDER — DIPHENHYDRAMINE HCL 25 MG PO CAPS
ORAL_CAPSULE | ORAL | Status: AC
  Filled 2019-01-04: qty 1

## 2019-01-04 MED ORDER — SODIUM CHLORIDE 0.9 % IV SOLN
510.0000 mg | Freq: Once | INTRAVENOUS | Status: AC
  Administered 2019-01-04: 510 mg via INTRAVENOUS
  Filled 2019-01-04: qty 510

## 2019-01-04 MED ORDER — SODIUM CHLORIDE 0.9 % IV SOLN
Freq: Once | INTRAVENOUS | Status: AC
  Administered 2019-01-04: 09:00:00 via INTRAVENOUS
  Filled 2019-01-04: qty 250

## 2019-01-04 MED ORDER — DIPHENHYDRAMINE HCL 25 MG PO CAPS
25.0000 mg | ORAL_CAPSULE | Freq: Once | ORAL | Status: AC
  Administered 2019-01-04: 25 mg via ORAL

## 2019-01-11 ENCOUNTER — Inpatient Hospital Stay: Payer: Medicaid Other

## 2019-01-16 ENCOUNTER — Telehealth: Payer: Self-pay | Admitting: Hematology and Oncology

## 2019-01-16 ENCOUNTER — Other Ambulatory Visit: Payer: Self-pay | Admitting: Hematology and Oncology

## 2019-01-16 NOTE — Telephone Encounter (Signed)
Left message re 12/9 appointment. Schedule mailed.

## 2019-01-23 ENCOUNTER — Inpatient Hospital Stay: Payer: Medicaid Other | Attending: Internal Medicine

## 2019-02-13 ENCOUNTER — Telehealth: Payer: Self-pay | Admitting: Hematology and Oncology

## 2019-02-13 NOTE — Telephone Encounter (Signed)
Scheduled per 11/9 los. Called and spoke with pt, confirmed 1/11 appt  

## 2019-02-24 ENCOUNTER — Other Ambulatory Visit: Payer: Self-pay | Admitting: Hematology and Oncology

## 2019-02-24 DIAGNOSIS — D500 Iron deficiency anemia secondary to blood loss (chronic): Secondary | ICD-10-CM

## 2019-02-24 NOTE — Progress Notes (Signed)
[place] [phone]   [phone]  PROGRESS NOTE  Patient Care Team: [name], MD as PCP - General (Internal Medicine)  Hematological/Oncological History # Iron Deficiency Anemia A) 34 year old female referred for evaluation due to persistent iron deficiency anemia. Pt was seen at [facility] and was treated in the past with IV iron. Pt has an intolerance for oral iron. She has been told she has uterine fibroids. B) Feraheme infusions on 05/04/18, 06/01/18, and 11/09/18 C) 12/24/2018: establish care with Dr. Lorenso Courier  D) 01/04/2019: 1 dose of Feraheme 545m IV. Missed 2nd dose  Interval History:  Elaine Perez 34y.o. female with medical history significant for iron deficiency anemia presents for a follow up visit. She was last seen on 12/24/2018 at which time she established care with our clinic.  In the interim since her last visit she received 1 dose of Feraheme 5127mIV on 01/04/2019. Unfortunately she did not received a second dose. She had an ED visit on 01/01/2019 for abdominal pain. This was thought to be 2/2 to fibroids and she was referred to Ob/Gyn outpatient.   On exam today Elaine Perez that she was not able to make it to her second dose of IV iron due to having too much going on.  She reports that she continues to have symptoms of iron deficiency including ice cravings and feeling cold.  Additionally she notes that she continues to have severe abdominal pain likely due to GYN source.  She reports that these pains are like "labor pains" she reports that there is stabbing burning in her legs and it feels like someone is pushing screwdrivers into her.  She notes that these come out of the blue, but are often associated with her nostril cycles.  She notes that her periods are "super heavy" and that she is still spotting from her last period which began on Tuesday of last week.  She reports that she goes through 2-3 bags of  pads per cycle with each bag containing approximately 24 pads.  She reports that she has not firmly established care with OB/GYN due to concerns about the discussion of surgery.  Furthermore we discussed the use of iron pills, however the patient notes that she did have some issues with oral iron pills.  She was not able to specify exactly what the symptoms she had were and she notes that for the most part they were "okay".  Otherwise the patient denies any other systemic symptoms.  She reports no shortness of breath, chest pain, lightheadedness, or dizziness.  A full 10 point ROS is listed below.  MEDICAL HISTORY:  Past Medical History:  Diagnosis Date  . Blood transfusion without reported diagnosis 2019  . Diabetes mellitus without complication ([place]  . Fibroids   . Hypertension   . Iron deficiency anemia 04/27/2018  . Sleep apnea    Does not use CPAP    SURGICAL HISTORY: Past Surgical History:  Procedure Laterality Date  . GALLBLADDER SURGERY    . REPEAT CESAREAN SECTION      SOCIAL HISTORY: Social History   Socioeconomic History  . Marital status: Married    Spouse name: Not on file  . Number of children: 3  . Years of education: Not on file  . Highest education level: Not on file  Occupational History  . Not on file  Tobacco Use  . Smoking status: Never Smoker  . Smokeless tobacco: Never Used  Substance  and Sexual Activity  . Alcohol use: Never  . Drug use: Never  . Sexual activity: Not on file  Other Topics Concern  . Not on file  Social History Narrative  . Not on file   Social Determinants of Health   Financial Resource Strain:   . Difficulty of Paying Living Expenses: Not on file  Food Insecurity:   . Worried About Charity fundraiser in the Last Year: Not on file  . Ran Out of Food in the Last Year: Not on file  Transportation Needs:   . Lack of Transportation (Medical): Not on file  . Lack of Transportation (Non-Medical): Not on file  Physical  Activity:   . Days of Exercise per Week: Not on file  . Minutes of Exercise per Session: Not on file  Stress:   . Feeling of Stress : Not on file  Social Connections:   . Frequency of Communication with Friends and Family: Not on file  . Frequency of Social Gatherings with Friends and Family: Not on file  . Attends Religious Services: Not on file  . Active Member of Clubs or Organizations: Not on file  . Attends Archivist Meetings: Not on file  . Marital Status: Not on file  Intimate Partner Violence:   . Fear of Current or Ex-Partner: Not on file  . Emotionally Abused: Not on file  . Physically Abused: Not on file  . Sexually Abused: Not on file    FAMILY HISTORY: Family History  Problem Relation Age of Onset  . Breast cancer Other   . Heart disease Mother   . Lupus Maternal Aunt   . Stomach cancer Maternal Aunt   . Colon cancer Neg Hx   . Esophageal cancer Neg Hx   . Rectal cancer Neg Hx     ALLERGIES:  is allergic to morphine and related.  MEDICATIONS:  Current Outpatient Medications  Medication Sig Dispense Refill  . albuterol (VENTOLIN HFA) 108 (90 Base) MCG/ACT inhaler Inhale 2 puffs into the lungs as needed.    . Fluticasone-Salmeterol (ADVAIR) 250-50 MCG/DOSE AEPB Inhale into the lungs.    . Multiple Vitamins-Minerals (HAIR SKIN AND NAILS FORMULA) TABS Take 1 tablet by mouth daily.    . pantoprazole (PROTONIX) 40 MG tablet Take 1 tablet (40 mg total) by mouth daily. 30 tablet 3   No current facility-administered medications for this visit.    REVIEW OF SYSTEMS:   Constitutional: ( - ) fevers, ( - )  chills , ( - ) night sweats Eyes: ( - ) blurriness of vision, ( - ) double vision, ( - ) watery eyes Ears, nose, mouth, throat, and face: ( - ) mucositis, ( - ) sore throat Respiratory: ( - ) cough, ( - ) dyspnea, ( - ) wheezes Cardiovascular: ( - ) palpitation, ( - ) chest discomfort, ( - ) lower extremity swelling Gastrointestinal:  ( - ) nausea, ( -  ) heartburn, ( - ) change in bowel habits Skin: ( - ) abnormal skin rashes Lymphatics: ( - ) new lymphadenopathy, ( - ) easy bruising Neurological: ( - ) numbness, ( - ) tingling, ( - ) new weaknesses Behavioral/Psych: ( - ) mood change, ( - ) new changes  All other systems were reviewed with the patient and are negative.  PHYSICAL EXAMINATION: ECOG PERFORMANCE STATUS: 1 - Symptomatic but completely ambulatory  Vitals:   02/25/19 0854  BP: (!) 160/98  Pulse: 94  Resp: 18  Temp:  98.5 F (36.9 C)  SpO2: 100%   Filed Weights   02/25/19 0854  Weight: 241 lb 8 oz (109.5 kg)    GENERAL: well appearing obese African American female, alert, no distress and comfortable SKIN: skin color, texture, turgor are normal, no rashes or significant lesions EYES: conjunctiva are pale and non-injected, sclera clear LUNGS: clear to auscultation and percussion with normal breathing effort HEART: regular rate & rhythm and no murmurs and no lower extremity edema ABDOMEN: soft, non-tender, non-distended, normal bowel sounds Musculoskeletal: no cyanosis of digits and no clubbing  PSYCH: alert & oriented x 3, fluent speech NEURO: no focal motor/sensory deficits  LABORATORY DATA:  I have reviewed the data as listed Recent Results (from the past 2160 hour(s))  Lipase, blood     Status: None   Collection Time: 12/11/18  5:14 PM  Result Value Ref Range   Lipase 35 11 - 51 U/L    Comment: Performed at [facility], [place] [address], [place], [place] [address]  Comprehensive metabolic panel     Status: Abnormal   Collection Time: 12/11/18  5:14 PM  Result Value Ref Range   Sodium 135 135 - 145 mmol/L   Potassium 3.9 3.5 - 5.1 mmol/L   Chloride 103 98 - 111 mmol/L   CO2 24 22 - 32 mmol/L   Glucose, Bld 127 (H) 70 - 99 mg/dL   BUN 14 6 - 20 mg/dL   Creatinine, Ser 0.76 0.44 - 1.00 mg/dL   Calcium 9.2 8.9 - 10.3 mg/dL   Total Protein 7.3 6.5 - 8.1 g/dL   Albumin 3.5 3.5 - 5.0 g/dL    AST 23 15 - 41 U/L   ALT 22 0 - 44 U/L   Alkaline Phosphatase 68 38 - 126 U/L   Total Bilirubin 0.2 (L) 0.3 - 1.2 mg/dL   GFR calc non Af Amer >60 >60 mL/min   GFR calc Af Amer >60 >60 mL/min   Anion gap 8 5 - 15    Comment: Performed at [facility], [place] [address] [place], [place] [address]  CBC     Status: Abnormal   Collection Time: 12/11/18  5:14 PM  Result Value Ref Range   WBC 8.9 4.0 - 10.5 K/uL   RBC 4.45 3.87 - 5.11 MIL/uL   Hemoglobin 9.7 (L) 12.0 - 15.0 g/dL   HCT 34.9 (L) 36.0 - 46.0 %   MCV 78.4 (L) 80.0 - 100.0 fL   MCH 21.8 (L) 26.0 - 34.0 pg   MCHC 27.8 (L) 30.0 - 36.0 g/dL   RDW 23.6 (H) 11.5 - 15.5 %   Platelets 411 (H) 150 - 400 K/uL   nRBC 0.0 0.0 - 0.2 %    Comment: Performed at [facility], [place] [address], [place], [place] [address]  Urinalysis, Routine w reflex microscopic     Status: Abnormal   Collection Time: 12/11/18  5:14 PM  Result Value Ref Range   Color, Urine YELLOW YELLOW   APPearance CLEAR CLEAR   Specific Gravity, Urine 1.032 (H) 1.005 - 1.030   pH 5.0 5.0 - 8.0   Glucose, UA NEGATIVE NEGATIVE mg/dL   Hgb urine dipstick NEGATIVE NEGATIVE   Bilirubin Urine NEGATIVE NEGATIVE   Ketones, ur 5 (A) NEGATIVE mg/dL   Protein, ur 100 (A) NEGATIVE mg/dL   Nitrite NEGATIVE NEGATIVE   Leukocytes,Ua NEGATIVE NEGATIVE   RBC / HPF 0-5 0 - 5 RBC/hpf   WBC, UA  0-5 0 - 5 WBC/hpf   Bacteria, UA RARE (A) NONE SEEN   Squamous Epithelial / LPF 0-5 0 - 5   Mucus PRESENT    Ca Oxalate Crys, UA PRESENT     Comment: Performed at [facility], [place] [address], [place], [place] [address]  I-Stat beta hCG blood, ED     Status: None   Collection Time: 12/11/18  5:33 PM  Result Value Ref Range   I-stat hCG, quantitative <5.0 <5 mIU/mL   Comment 3            Comment:   GEST. AGE      CONC.  (mIU/mL)   <=1 WEEK        5 - 50     2 WEEKS       50 - 500     3 WEEKS       100 - 10,000     4 WEEKS      1,000 - 30,000        FEMALE AND NON-PREGNANT FEMALE:     LESS THAN 5 mIU/mL   Ferritin     Status: Abnormal   Collection Time: 12/24/18 11:35 AM  Result Value Ref Range   Ferritin 6 (L) 11 - 307 ng/mL    Comment: Performed at [facility], [place] [address], [place], [place] [address]  Iron and TIBC     Status: Abnormal   Collection Time: 12/24/18 11:35 AM  Result Value Ref Range   Iron 38 (L) 41 - 142 ug/dL   TIBC 350 236 - 444 ug/dL   Saturation Ratios 11 (L) 21 - 57 %   UIBC 312 120 - 384 ug/dL    Comment: Performed at [facility], [place] [address], [place], [place] [address]  CMP ([place] only)     Status: Abnormal   Collection Time: 12/24/18 11:35 AM  Result Value Ref Range   Sodium 142 135 - 145 mmol/L   Potassium 3.9 3.5 - 5.1 mmol/L   Chloride 109 98 - 111 mmol/L   CO2 24 22 - 32 mmol/L   Glucose, Bld 100 (H) 70 - 99 mg/dL   BUN 18 6 - 20 mg/dL   Creatinine 0.97 0.44 - 1.00 mg/dL   Calcium 8.9 8.9 - 10.3 mg/dL   Total Protein 7.1 6.5 - 8.1 g/dL   Albumin 3.4 (L) 3.5 - 5.0 g/dL   AST 14 (L) 15 - 41 U/L   ALT 17 0 - 44 U/L   Alkaline Phosphatase 75 38 - 126 U/L   Total Bilirubin <0.2 (L) 0.3 - 1.2 mg/dL   GFR, Est Non Af Am >60 >60 mL/min   GFR, Est AFR Am >60 >60 mL/min   Anion gap 9 5 - 15    Comment: Performed at [facility], [address], [place], [place] [address]  Save Smear [facility])     Status: None   Collection Time: 12/24/18 11:35 AM  Result Value Ref Range   Smear Review SMEAR STAINED AND AVAILABLE FOR REVIEW     Comment: Performed at [facility], [address] [place], [place] [address]  CBC with Differential ([place] Only)     Status: Abnormal   Collection Time: 12/24/18 11:35 AM  Result Value Ref Range   WBC Count 8.6 4.0 - 10.5 K/uL   RBC 4.22 3.87 - 5.11 MIL/uL   Hemoglobin 9.3 (L)  12.0 - 15.0 g/dL    Comment: Reticulocyte Hemoglobin  testing may be clinically indicated, consider ordering this additional test [id number]    HCT 32.4 (L) 36.0 - 46.0 %   MCV 76.8 (L) 80.0 - 100.0 fL   MCH 22.0 (L) 26.0 - 34.0 pg   MCHC 28.7 (L) 30.0 - 36.0 g/dL   RDW 22.2 (H) 11.5 - 15.5 %   Platelet Count 407 (H) 150 - 400 K/uL   nRBC 0.0 0.0 - 0.2 %   Neutrophils Relative % 41 %   Neutro Abs 3.5 1.7 - 7.7 K/uL   Lymphocytes Relative 42 %   Lymphs Abs 3.7 0.7 - 4.0 K/uL   Monocytes Relative 10 %   Monocytes Absolute 0.9 0.1 - 1.0 K/uL   Eosinophils Relative 6 %   Eosinophils Absolute 0.5 0.0 - 0.5 K/uL   Basophils Relative 1 %   Basophils Absolute 0.1 0.0 - 0.1 K/uL   Immature Granulocytes 0 %   Abs Immature Granulocytes 0.01 0.00 - 0.07 K/uL    Comment: Performed at [facility], [address] [place], [place] [address]  Comprehensive metabolic panel     Status: Abnormal   Collection Time: 01/01/19  8:59 AM  Result Value Ref Range   Sodium 135 135 - 145 mmol/L   Potassium 4.1 3.5 - 5.1 mmol/L   Chloride 106 98 - 111 mmol/L   CO2 22 22 - 32 mmol/L   Glucose, Bld 101 (H) 70 - 99 mg/dL   BUN 11 6 - 20 mg/dL   Creatinine, Ser 0.79 0.44 - 1.00 mg/dL   Calcium 8.9 8.9 - 10.3 mg/dL   Total Protein 7.2 6.5 - 8.1 g/dL   Albumin 3.3 (L) 3.5 - 5.0 g/dL   AST 20 15 - 41 U/L   ALT 20 0 - 44 U/L   Alkaline Phosphatase 60 38 - 126 U/L   Total Bilirubin 0.3 0.3 - 1.2 mg/dL   GFR calc non Af Amer >60 >60 mL/min   GFR calc Af Amer >60 >60 mL/min   Anion gap 7 5 - 15    Comment: Performed at [place] [address] [place], [place] [address]  CBC     Status: Abnormal   Collection Time: 01/01/19  8:59 AM  Result Value Ref Range   WBC 9.0 4.0 - 10.5 K/uL   RBC 4.29 3.87 - 5.11 MIL/uL   Hemoglobin 9.5 (L) 12.0 - 15.0 g/dL   HCT 34.2 (L) 36.0 - 46.0 %   MCV 79.7 (L) 80.0 - 100.0 fL   MCH 22.1 (L) 26.0 - 34.0 pg   MCHC 27.8 (L) 30.0 - 36.0 g/dL   RDW 21.4 (H) 11.5 - 15.5 %   Platelets 530 (H)  150 - 400 K/uL   nRBC 0.3 (H) 0.0 - 0.2 %    Comment: Performed at [facility], [place] [address], [place], [place] [address]  Lipase, blood     Status: None   Collection Time: 01/01/19  8:59 AM  Result Value Ref Range   Lipase 29 11 - 51 U/L    Comment: Performed at [place] [address] [place], [place] [address]  I-Stat beta hCG blood, ED     Status: None   Collection Time: 01/01/19  9:09 AM  Result Value Ref Range   I-stat hCG, quantitative <5.0 <5 mIU/mL   Comment 3  Comment:   GEST. AGE      CONC.  (mIU/mL)   <=1 WEEK        5 - 50     2 WEEKS       50 - 500     3 WEEKS       100 - 10,000     4 WEEKS     1,000 - 30,000        FEMALE AND NON-PREGNANT FEMALE:     LESS THAN 5 mIU/mL   Urinalysis, Routine w reflex microscopic     Status: Abnormal   Collection Time: 01/01/19 10:50 AM  Result Value Ref Range   Color, Urine YELLOW YELLOW   APPearance CLEAR CLEAR   Specific Gravity, Urine 1.018 1.005 - 1.030   pH 5.0 5.0 - 8.0   Glucose, UA NEGATIVE NEGATIVE mg/dL   Hgb urine dipstick SMALL (A) NEGATIVE   Bilirubin Urine NEGATIVE NEGATIVE   Ketones, ur NEGATIVE NEGATIVE mg/dL   Protein, ur NEGATIVE NEGATIVE mg/dL   Nitrite NEGATIVE NEGATIVE   Leukocytes,Ua NEGATIVE NEGATIVE   WBC, UA 0-5 0 - 5 WBC/hpf   Bacteria, UA NONE SEEN NONE SEEN   Squamous Epithelial / LPF 0-5 0 - 5   Mucus PRESENT     Comment: Performed at [facility], [place] [address] [place], [place] [address]  CBC with Differential ([place] Only)     Status: Abnormal   Collection Time: 02/25/19  8:35 AM  Result Value Ref Range   WBC Count 9.5 4.0 - 10.5 K/uL   RBC 4.03 3.87 - 5.11 MIL/uL   Hemoglobin 9.5 (L) 12.0 - 15.0 g/dL   HCT 32.2 (L) 36.0 - 46.0 %   MCV 79.9 (L) 80.0 - 100.0 fL   MCH 23.6 (L) 26.0 - 34.0 pg   MCHC 29.5 (L) 30.0 - 36.0 g/dL   RDW 18.2 (H) 11.5 - 15.5 %   Platelet Count 451 (H) 150 - 400 K/uL   nRBC 0.0 0.0 - 0.2 %   Neutrophils Relative % 55 %    Neutro Abs 5.3 1.7 - 7.7 K/uL   Lymphocytes Relative 34 %   Lymphs Abs 3.2 0.7 - 4.0 K/uL   Monocytes Relative 8 %   Monocytes Absolute 0.7 0.1 - 1.0 K/uL   Eosinophils Relative 2 %   Eosinophils Absolute 0.2 0.0 - 0.5 K/uL   Basophils Relative 1 %   Basophils Absolute 0.1 0.0 - 0.1 K/uL   Immature Granulocytes 0 %   Abs Immature Granulocytes 0.03 0.00 - 0.07 K/uL    Comment: Performed at [facility], [place] [address] [place], [place] [address]  CMP ([place] only)     Status: Abnormal   Collection Time: 02/25/19  8:35 AM  Result Value Ref Range   Sodium 140 135 - 145 mmol/L   Potassium 3.8 3.5 - 5.1 mmol/L   Chloride 107 98 - 111 mmol/L   CO2 24 22 - 32 mmol/L   Glucose, Bld 102 (H) 70 - 99 mg/dL   BUN 12 6 - 20 mg/dL   Creatinine 0.80 0.44 - 1.00 mg/dL   Calcium 9.0 8.9 - 10.3 mg/dL   Total Protein 7.4 6.5 - 8.1 g/dL   Albumin 3.6 3.5 - 5.0 g/dL   AST 14 (L) 15 - 41 U/L   ALT 18 0 - 44 U/L   Alkaline Phosphatase 66 38 - 126 U/L   Total Bilirubin <0.2 (L)  0.3 - 1.2 mg/dL   GFR, Est Non Af Am >60 >60 mL/min   GFR, Est AFR Am >60 >60 mL/min   Anion gap 9 5 - 15    Comment: Performed at [facility], [address] [place], [place] [address]  Retic Panel     Status: Abnormal   Collection Time: 02/25/19  8:36 AM  Result Value Ref Range   Retic Ct Pct 1.7 0.4 - 3.1 %   RBC. 3.95 3.87 - 5.11 MIL/uL   Retic Count, Absolute 65.6 19.0 - 186.0 K/uL   Immature Retic Fract 31.5 (H) 2.3 - 15.9 %   Reticulocyte Hemoglobin 22.4 (L) >27.9 pg    Comment:        A RET-He < 28 pg is an indication of iron-deficient or iron- insufficient erythropoiesis. Patients with thalassemia may also have a decreased RET-He result unrelated to iron availability.     If this patient has chronic kidney disease and does not have a hemoglobinopathy he/she meets criteria for iron deficiency per the 2016 NICE guidelines. Refer to specific  guidelines to determine the appropriate thresholds for treating CKD- associated iron deficiency. TSAT and ferritin should be used in patients with hemoglobinopathies (e.g. thalassemia). Performed at [facility], [place] [address], [place], [place] [address]   Iron and TIBC     Status: Abnormal   Collection Time: 02/25/19  8:36 AM  Result Value Ref Range   Iron 15 (L) 41 - 142 ug/dL   TIBC 395 236 - 444 ug/dL   Saturation Ratios 4 (L) 21 - 57 %   UIBC 380 120 - 384 ug/dL    Comment: Performed at [facility], [place] [address], [place], [place] [address]    RADIOGRAPHIC STUDIES: None relevant to review.  ASSESSMENT & PLAN Elaine Perez 34 y.o. female with medical history significant for iron deficiency anemia presents for a follow up visit.  On review of the labs and discussion with the patient her findings are still consistent with iron deficiency anemia secondary to heavy GYN blood loss.  Unfortunately in the interim the patient has not established with OB/GYN and is only received 1 dose of the IV iron.  On check today her hemoglobin has remained perfectly stable at 9.5.  Likely reason for this is that she has continued to have heavy bleeding and only received a single dose of 510 mg of IV Feraheme.  After discussion today the patient was agreeable to continuing IV iron and noted that she would attempt to establish with OB/GYN to help get her cycles under control.  I stressed to her the importance of the OB/GYN visit, noting that iron will only help her increase her hemoglobin levels as long as she is not bleeding, and that if her GYN bleeding is not controlled that we will never be able to fix the problem with iron alone.  She voiced her understanding.  According to the [facility] equation that patient has an iron deficit of 1156 mg of iron. With 2 doses of Feraheme the patient will receive 1046m of iron.   #Iron Deficiency Anemia 2/2 Gyn Blood  Loss --recheck CBC, CMP, reticulocyte and iron panels today --will restart feraheme 5156mx 2 doses separated by 1 week.  --patient currently in the process of reconnecting with Ob/Gyn for heavy cycles and fibroid pain. We will place a referral today to try and expedite this process.  --RTC in 3 months to assess  continued response to iron therapy   Orders Placed This Encounter  Procedures  . Ambulatory referral to Gynecology    Referral Priority:   Routine    Referral Type:   Consultation    Referral Reason:   Specialty Services Required    Requested Specialty:   Gynecology    Number of Visits Requested:   1    All questions were answered. The patient knows to call the clinic with any problems, questions or concerns.  A total of more than 30 minutes were spent on this encounter and over half of that time was spent on counseling and coordination of care as outlined above.   Ledell Peoples, MD Department of Hematology/Oncology [place] at [facility] Phone: [phone] Pager: [phone] Email: [email or web address]  02/25/2019 9:47 AM

## 2019-02-25 ENCOUNTER — Inpatient Hospital Stay: Payer: Medicaid Other

## 2019-02-25 ENCOUNTER — Inpatient Hospital Stay: Payer: Medicaid Other | Attending: Internal Medicine | Admitting: Hematology and Oncology

## 2019-02-25 ENCOUNTER — Other Ambulatory Visit: Payer: Self-pay

## 2019-02-25 VITALS — BP 160/98 | HR 94 | Temp 98.5°F | Resp 18 | Ht 61.0 in | Wt 241.5 lb

## 2019-02-25 DIAGNOSIS — N939 Abnormal uterine and vaginal bleeding, unspecified: Secondary | ICD-10-CM | POA: Insufficient documentation

## 2019-02-25 DIAGNOSIS — D500 Iron deficiency anemia secondary to blood loss (chronic): Secondary | ICD-10-CM | POA: Diagnosis present

## 2019-02-25 LAB — RETIC PANEL
Immature Retic Fract: 31.5 % — ABNORMAL HIGH (ref 2.3–15.9)
RBC.: 3.95 MIL/uL (ref 3.87–5.11)
Retic Count, Absolute: 65.6 10*3/uL (ref 19.0–186.0)
Retic Ct Pct: 1.7 % (ref 0.4–3.1)
Reticulocyte Hemoglobin: 22.4 pg — ABNORMAL LOW (ref >27.9)

## 2019-02-25 LAB — IRON AND TIBC
Iron: 15 ug/dL — ABNORMAL LOW (ref 41–142)
Saturation Ratios: 4 % — ABNORMAL LOW (ref 21–57)
TIBC: 395 ug/dL (ref 236–444)
UIBC: 380 ug/dL (ref 120–384)

## 2019-02-25 LAB — CBC WITH DIFFERENTIAL (CANCER CENTER ONLY)
Abs Immature Granulocytes: 0.03 10*3/uL (ref 0.00–0.07)
Basophils Absolute: 0.1 10*3/uL (ref 0.0–0.1)
Basophils Relative: 1 %
Eosinophils Absolute: 0.2 10*3/uL (ref 0.0–0.5)
Eosinophils Relative: 2 %
HCT: 32.2 % — ABNORMAL LOW (ref 36.0–46.0)
Hemoglobin: 9.5 g/dL — ABNORMAL LOW (ref 12.0–15.0)
Immature Granulocytes: 0 %
Lymphocytes Relative: 34 %
Lymphs Abs: 3.2 10*3/uL (ref 0.7–4.0)
MCH: 23.6 pg — ABNORMAL LOW (ref 26.0–34.0)
MCHC: 29.5 g/dL — ABNORMAL LOW (ref 30.0–36.0)
MCV: 79.9 fL — ABNORMAL LOW (ref 80.0–100.0)
Monocytes Absolute: 0.7 10*3/uL (ref 0.1–1.0)
Monocytes Relative: 8 %
Neutro Abs: 5.3 10*3/uL (ref 1.7–7.7)
Neutrophils Relative %: 55 %
Platelet Count: 451 10*3/uL — ABNORMAL HIGH (ref 150–400)
RBC: 4.03 MIL/uL (ref 3.87–5.11)
RDW: 18.2 % — ABNORMAL HIGH (ref 11.5–15.5)
WBC Count: 9.5 10*3/uL (ref 4.0–10.5)
nRBC: 0 % (ref 0.0–0.2)

## 2019-02-25 LAB — CMP (CANCER CENTER ONLY)
ALT: 18 U/L (ref 0–44)
AST: 14 U/L — ABNORMAL LOW (ref 15–41)
Albumin: 3.6 g/dL (ref 3.5–5.0)
Alkaline Phosphatase: 66 U/L (ref 38–126)
Anion gap: 9 (ref 5–15)
BUN: 12 mg/dL (ref 6–20)
CO2: 24 mmol/L (ref 22–32)
Calcium: 9 mg/dL (ref 8.9–10.3)
Chloride: 107 mmol/L (ref 98–111)
Creatinine: 0.8 mg/dL (ref 0.44–1.00)
GFR, Est AFR Am: >60 mL/min (ref >60)
GFR, Estimated: >60 mL/min (ref >60)
Glucose, Bld: 102 mg/dL — ABNORMAL HIGH (ref 70–99)
Potassium: 3.8 mmol/L (ref 3.5–5.1)
Sodium: 140 mmol/L (ref 135–145)
Total Bilirubin: <0.2 mg/dL — ABNORMAL LOW (ref 0.3–1.2)
Total Protein: 7.4 g/dL (ref 6.5–8.1)

## 2019-02-25 LAB — FERRITIN: Ferritin: <4 ng/mL — ABNORMAL LOW (ref 11–307)

## 2019-02-26 ENCOUNTER — Telehealth: Payer: Self-pay | Admitting: Hematology and Oncology

## 2019-02-26 NOTE — Telephone Encounter (Signed)
Scheduled per los. Called and spoke with patient. Confirmed appt 

## 2019-03-05 ENCOUNTER — Emergency Department (HOSPITAL_COMMUNITY)
Admission: EM | Admit: 2019-03-05 | Discharge: 2019-03-05 | Disposition: A | Discharge status: Home / Self Care | Payer: Medicaid Other | Attending: Emergency Medicine | Admitting: Emergency Medicine

## 2019-03-05 ENCOUNTER — Other Ambulatory Visit: Payer: Self-pay

## 2019-03-05 ENCOUNTER — Encounter (HOSPITAL_COMMUNITY): Payer: Self-pay | Admitting: Family Medicine

## 2019-03-05 ENCOUNTER — Emergency Department (HOSPITAL_COMMUNITY): Payer: Medicaid Other

## 2019-03-05 DIAGNOSIS — Y999 Unspecified external cause status: Secondary | ICD-10-CM | POA: Diagnosis not present

## 2019-03-05 DIAGNOSIS — I10 Essential (primary) hypertension: Secondary | ICD-10-CM | POA: Insufficient documentation

## 2019-03-05 DIAGNOSIS — X500XXA Overexertion from strenuous movement or load, initial encounter: Secondary | ICD-10-CM | POA: Diagnosis not present

## 2019-03-05 DIAGNOSIS — Y9389 Activity, other specified: Secondary | ICD-10-CM | POA: Insufficient documentation

## 2019-03-05 DIAGNOSIS — E119 Type 2 diabetes mellitus without complications: Secondary | ICD-10-CM | POA: Insufficient documentation

## 2019-03-05 DIAGNOSIS — Y9259 Other trade areas as the place of occurrence of the external cause: Secondary | ICD-10-CM | POA: Insufficient documentation

## 2019-03-05 DIAGNOSIS — Z79899 Other long term (current) drug therapy: Secondary | ICD-10-CM | POA: Diagnosis not present

## 2019-03-05 DIAGNOSIS — M25512 Pain in left shoulder: Secondary | ICD-10-CM

## 2019-03-05 MED ORDER — ACETAMINOPHEN 500 MG PO TABS
1000.0000 mg | ORAL_TABLET | Freq: Once | ORAL | Status: AC
  Administered 2019-03-05: 1000 mg via ORAL
  Filled 2019-03-05: qty 2

## 2019-03-05 MED ORDER — METHOCARBAMOL 500 MG PO TABS: 500.0000 mg | ORAL_TABLET | Freq: Two times a day (BID) | ORAL | 0 refills | Status: DC

## 2019-03-05 NOTE — ED Provider Notes (Signed)
[facility] DEPT Provider Note   CSN: [id number] Arrival date & time: 03/05/19  1935     History Chief Complaint  Patient presents with  . Shoulder Pain  . Arm Pain    Elaine Perez is a 34 y.o. female who presents for evaluation of left shoulder pain that began yesterday.  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  She states that yesterday while lifting a heavy box, she started experiencing sharp burning pain in her left shoulder.  She states that the pain radiates down her left upper extremity.  She also reports some numbness/tingling sensation noted to fingers.  She reports pain is worse when she moves.  She denies any overlying warmth, erythema, fevers.   The history is provided by the patient.       Past Medical History:  Diagnosis Date  . Blood transfusion without reported diagnosis 2019  . Diabetes mellitus without complication ([place])   . Fibroids   . Hypertension   . Iron deficiency anemia 04/27/2018  . Sleep apnea    Does not use CPAP    Patient Active Problem List   Diagnosis Date Noted  . Iron deficiency anemia due to chronic blood loss 08/31/2018  . Iron deficiency anemia 04/27/2018    Past Surgical History:  Procedure Laterality Date  . GALLBLADDER SURGERY    . REPEAT CESAREAN SECTION       OB History   No obstetric history on file.     Family History  Problem Relation Age of Onset  . Breast cancer Other   . Heart disease Mother   . Lupus Maternal Aunt   . Stomach cancer Maternal Aunt   . Colon cancer Neg Hx   . Esophageal cancer Neg Hx   . Rectal cancer Neg Hx     Social History   Tobacco Use  . Smoking status: Never Smoker  . Smokeless tobacco: Never Used  Substance Use Topics  . Alcohol use: Never  . Drug use: Never    Home Medications Prior to Admission medications   Medication Sig Start Date End Date Taking? Authorizing Provider  albuterol (VENTOLIN HFA) 108 (90 Base)  MCG/ACT inhaler Inhale 2 puffs into the lungs as needed. 06/15/10   [provider]  Fluticasone-Salmeterol (ADVAIR) 250-50 MCG/DOSE AEPB Inhale into the lungs. 05/23/18   [provider]  methocarbamol (ROBAXIN) 500 MG tablet Take 1 tablet (500 mg total) by mouth 2 (two) times daily. 03/05/19   Volanda Napoleon, PA-C  Multiple Vitamins-Minerals (HAIR SKIN AND NAILS FORMULA) TABS Take 1 tablet by mouth daily.    [provider]  pantoprazole (PROTONIX) 40 MG tablet Take 1 tablet (40 mg total) by mouth daily. 10/04/18   Thornton Park, MD    Allergies    Morphine and related  Review of Systems   Review of Systems  Constitutional: Negative for fever.  Musculoskeletal:       Shoulder pain  Neurological: Positive for numbness. Negative for weakness.  All other systems reviewed and are negative.   Physical Exam Updated Vital Signs BP (!) 178/96 (BP Location: Right Arm)   Pulse 75   Temp 99.5 F (37.5 C) (Oral)   Resp 16   Ht 5\' 1"  (1.549 m)   Wt 104.3 kg   LMP 02/12/2019   SpO2 98%   BMI 43.46 kg/m   Physical Exam Vitals and nursing note reviewed.  Constitutional:      Appearance:  She is well-developed.  HENT:     Head: Normocephalic and atraumatic.  Eyes:     General: No scleral icterus.       Right eye: No discharge.        Left eye: No discharge.     Conjunctiva/sclera: Conjunctivae normal.  Cardiovascular:     Pulses:          Radial pulses are 2+ on the right side and 2+ on the left side.  Pulmonary:     Effort: Pulmonary effort is normal.  Musculoskeletal:     Comments: Tenderness palpation noted to left shoulder.  No deformity or crepitus noted.  Limited range of motion secondary to pain.  She can get to about 90 degrees of flexion before she starts having pain.  Positive Hawkins, supraspinatus test.  Unable to assess Neer's impingement, liftoff test.  No tenderness palpation in left elbow, left wrist.  Full range of motion of right upper  extremities any difficulty.  Negative Neer's, Hawkins, liftoff test, empty can test.  Skin:    General: Skin is warm and dry.     Comments: Good distal cap refill.  LUE is not dusky in appearance or cool to touch.  Neurological:     Mental Status: She is alert.     Comments: Equal grip strength bilaterally.  She reports some subjective tingling sensation to her fingertips.  No other numbness/weakness.  Psychiatric:        Speech: Speech normal.        Behavior: Behavior normal.     ED Results / Procedures / Treatments   Labs (all labs ordered are listed, but only abnormal results are displayed) Labs Reviewed - No data to display  EKG None  Radiology DG Shoulder Left  Result Date: 03/05/2019 CLINICAL DATA:  Shoulder pain EXAM: LEFT SHOULDER - 2+ VIEW COMPARISON:  None. FINDINGS: There is no evidence of fracture or dislocation. There is no evidence of arthropathy or other focal bone abnormality. Soft tissues are unremarkable. IMPRESSION: Negative. Electronically Signed   By: Ulyses Jarred M.D.   On: 03/05/2019 20:39    Procedures Procedures (including critical care time)  Medications Ordered in ED Medications  acetaminophen (TYLENOL) tablet 1,000 mg (has no administration in time range)    ED Course  I have reviewed the triage vital signs and the nursing notes.  Pertinent labs & imaging results that were available during my care of the patient were reviewed by me and considered in my medical decision making (see chart for details).    MDM Rules/Calculators/A&P                      34 year old female who presents for evaluation of left shoulder pain that began yesterday.  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 Patient is afebrile, non-toxic appearing, sitting comfortably on examination table. Vital signs reviewed and stable.  Patient with good distal pulses, cap refill.   She reports some tingling sensation in her fingers but can feel me touching her hand and can move her fingers without any difficulty.  Reports that this radiates down her arm.  Also reports some associated numbness to her fingertips.  No fevers, overlying warmth or erythema.  History/physical exam is not concerning for septic arthritis, ischemic limb, DVT, biceps tendon tear.  Consider musculoskeletal injury such as ligament sprain, tendon sprain, rotator cuff pathology. Low  suspicion for fracture dislocation.  X-ray reviewed.  Negative for acute any abnormality.  Discussed results with patient.  I discussed with her that she could still have a ligament, tendon injury or rotator cuff injury.  I instructed patient that she will need to be in a shoulder sling to help with support and stabilization.  We will give her orthopedic referral. At this time, patient exhibits no emergent life-threatening condition that require further evaluation in ED. Patient had ample opportunity for questions and discussion. All patient's questions were answered with full understanding. Strict return precautions discussed. Patient expresses understanding and agreement to plan.   Portions of this note were generated with Lobbyist. Dictation errors may occur despite best attempts at proofreading.   Final Clinical Impression(s) / ED Diagnoses Final diagnoses:  Acute pain of left shoulder    Rx / DC Orders ED Discharge Orders         Ordered    methocarbamol (ROBAXIN) 500 MG tablet  2 times daily     03/05/19 2054           Desma Mcgregor 03/05/19 2111    Margette Fast, MD 03/07/19 1729

## 2019-03-05 NOTE — ED Triage Notes (Signed)
Patient states yesterday while lifting a heavy box she started experiencing a sharp, burning pain in her left shoulder that radiates to left arm. No obvious deformity. Numbness and tingling reported to her fingers.

## 2019-03-05 NOTE — Discharge Instructions (Signed)
You can take Tylenol or Ibuprofen as directed for pain. You can alternate Tylenol and Ibuprofen every 4 hours. If you take Tylenol at 1pm, then you can take Ibuprofen at 5pm. Then you can take Tylenol again at 9pm.   Take Robaxin as prescribed. This medication will make you drowsy so do not drive or drink alcohol when taking it.  Follow the RICE (Rest, Ice, Compression, Elevation) protocol as directed.   As we discussed, your x-ray today was unremarkable.  You may still have an injury to the ligament, tendon, toe muscle.  You may also have injury to the rotator cuff muscles.  As we discussed, you will need to follow-up with orthopedic.  Wear sling for support and stabilization.  Do not wear it 24/7 as this may cause frozen shoulder syndrome.  Return emergency department for any worsening pain, redness or swelling of the shoulder, fevers, numbness/weakness or any other worsening or concerning symptoms.

## 2019-03-11 ENCOUNTER — Inpatient Hospital Stay: Payer: Medicaid Other

## 2019-03-14 ENCOUNTER — Ambulatory Visit: Payer: Medicaid Other | Admitting: Orthopaedic Surgery

## 2019-05-14 ENCOUNTER — Other Ambulatory Visit: Payer: Self-pay | Admitting: Hematology and Oncology

## 2019-05-14 DIAGNOSIS — D500 Iron deficiency anemia secondary to blood loss (chronic): Secondary | ICD-10-CM

## 2019-05-15 ENCOUNTER — Inpatient Hospital Stay (HOSPITAL_BASED_OUTPATIENT_CLINIC_OR_DEPARTMENT_OTHER): Payer: Medicaid Other | Admitting: Hematology and Oncology

## 2019-05-15 ENCOUNTER — Inpatient Hospital Stay: Payer: Medicaid Other | Attending: Internal Medicine

## 2019-05-15 ENCOUNTER — Other Ambulatory Visit: Payer: Self-pay

## 2019-05-15 ENCOUNTER — Encounter: Payer: Self-pay | Admitting: Hematology and Oncology

## 2019-05-15 VITALS — BP 127/87 | HR 94 | Temp 98.9°F | Resp 18 | Ht 61.0 in | Wt 241.6 lb

## 2019-05-15 DIAGNOSIS — N921 Excessive and frequent menstruation with irregular cycle: Secondary | ICD-10-CM

## 2019-05-15 DIAGNOSIS — N939 Abnormal uterine and vaginal bleeding, unspecified: Secondary | ICD-10-CM | POA: Insufficient documentation

## 2019-05-15 DIAGNOSIS — R5383 Other fatigue: Secondary | ICD-10-CM | POA: Insufficient documentation

## 2019-05-15 DIAGNOSIS — D500 Iron deficiency anemia secondary to blood loss (chronic): Secondary | ICD-10-CM | POA: Diagnosis present

## 2019-05-15 DIAGNOSIS — R002 Palpitations: Secondary | ICD-10-CM | POA: Insufficient documentation

## 2019-05-15 DIAGNOSIS — R0602 Shortness of breath: Secondary | ICD-10-CM | POA: Insufficient documentation

## 2019-05-15 DIAGNOSIS — R519 Headache, unspecified: Secondary | ICD-10-CM | POA: Diagnosis not present

## 2019-05-15 DIAGNOSIS — D649 Anemia, unspecified: Secondary | ICD-10-CM

## 2019-05-15 LAB — FERRITIN: Ferritin: <4 ng/mL — ABNORMAL LOW (ref 11–307)

## 2019-05-15 LAB — CBC WITH DIFFERENTIAL (CANCER CENTER ONLY)
Abs Immature Granulocytes: 0.05 10*3/uL (ref 0.00–0.07)
Basophils Absolute: 0.1 10*3/uL (ref 0.0–0.1)
Basophils Relative: 1 %
Eosinophils Absolute: 0.2 10*3/uL (ref 0.0–0.5)
Eosinophils Relative: 3 %
HCT: 29.8 % — ABNORMAL LOW (ref 36.0–46.0)
Hemoglobin: 8 g/dL — ABNORMAL LOW (ref 12.0–15.0)
Immature Granulocytes: 1 %
Lymphocytes Relative: 28 %
Lymphs Abs: 2.5 10*3/uL (ref 0.7–4.0)
MCH: 19.7 pg — ABNORMAL LOW (ref 26.0–34.0)
MCHC: 26.8 g/dL — ABNORMAL LOW (ref 30.0–36.0)
MCV: 73.2 fL — ABNORMAL LOW (ref 80.0–100.0)
Monocytes Absolute: 0.9 10*3/uL (ref 0.1–1.0)
Monocytes Relative: 10 %
Neutro Abs: 5.2 10*3/uL (ref 1.7–7.7)
Neutrophils Relative %: 57 %
Platelet Count: 572 10*3/uL — ABNORMAL HIGH (ref 150–400)
RBC: 4.07 MIL/uL (ref 3.87–5.11)
RDW: 18.8 % — ABNORMAL HIGH (ref 11.5–15.5)
WBC Count: 8.9 10*3/uL (ref 4.0–10.5)
nRBC: 0.6 % — ABNORMAL HIGH (ref 0.0–0.2)

## 2019-05-15 LAB — IRON AND TIBC
Iron: 19 ug/dL — ABNORMAL LOW (ref 41–142)
Saturation Ratios: 4 % — ABNORMAL LOW (ref 21–57)
TIBC: 493 ug/dL — ABNORMAL HIGH (ref 236–444)
UIBC: 474 ug/dL — ABNORMAL HIGH (ref 120–384)

## 2019-05-15 LAB — CMP (CANCER CENTER ONLY)
ALT: 18 U/L (ref 0–44)
AST: 13 U/L — ABNORMAL LOW (ref 15–41)
Albumin: 3.4 g/dL — ABNORMAL LOW (ref 3.5–5.0)
Alkaline Phosphatase: 78 U/L (ref 38–126)
Anion gap: 9 (ref 5–15)
BUN: 11 mg/dL (ref 6–20)
CO2: 21 mmol/L — ABNORMAL LOW (ref 22–32)
Calcium: 9.1 mg/dL (ref 8.9–10.3)
Chloride: 109 mmol/L (ref 98–111)
Creatinine: 0.86 mg/dL (ref 0.44–1.00)
GFR, Est AFR Am: >60 mL/min (ref >60)
GFR, Estimated: >60 mL/min (ref >60)
Glucose, Bld: 168 mg/dL — ABNORMAL HIGH (ref 70–99)
Potassium: 4.2 mmol/L (ref 3.5–5.1)
Sodium: 139 mmol/L (ref 135–145)
Total Bilirubin: <0.2 mg/dL — ABNORMAL LOW (ref 0.3–1.2)
Total Protein: 7.8 g/dL (ref 6.5–8.1)

## 2019-05-15 LAB — RETIC PANEL
Immature Retic Fract: 34.9 % — ABNORMAL HIGH (ref 2.3–15.9)
RBC.: 4.16 MIL/uL (ref 3.87–5.11)
Retic Count, Absolute: 71.6 10*3/uL (ref 19.0–186.0)
Retic Ct Pct: 1.7 % (ref 0.4–3.1)
Reticulocyte Hemoglobin: 17.4 pg — ABNORMAL LOW (ref >27.9)

## 2019-05-15 NOTE — Progress Notes (Signed)
[facility] Telephone:([phone]) [phone]   [phone]  PROGRESS NOTE  Patient Care Team: [name], MD as PCP - General (Internal Medicine)  Hematological/Oncological History #Iron Deficiency Anemia Secondary to GYN Bleeding.  A)34 year old female referred for evaluation due to persistent iron deficiency anemia. Pt was seen at [facility] and was treated in the past with IV iron. Pt has an intolerance for oral iron. She has been told she has uterine fibroids. B) Feraheme infusionson 05/04/18, 06/01/18, and 11/09/18 C) 12/24/2018: establish care with Dr. Lorenso Courier  D) 01/04/2019: 1 dose of Feraheme 51m IV. Missed 2nd dose E) 02/25/2019: seen in clinic. Scheduled for an additional 2 doses of feraheme. Patient no showed to these visits. WBC 9.5, Hgb 9.5, MCV 79.9, Plt 451 F) 05/15/2019: patient requested urgent visit due to symptoms of anemia. WBC 8.9, Hgb 8.0, MCV 73.2, Plt 572  Interval History:  Elaine Perez 34y.o. female with medical history significant for iron deficiency anemia who presents for a follow up visit. The patient's last visit was on 02/25/2019. In the interim since the last visit the patient missed her scheduled Feraheme infusions.   On exam today Elaine Perez that a lot has changed since her last visit in early January.  She reports that she has started a new job at the [employer] that she has been quite busy working that job.  She wanted her visit today because she was starting to have symptoms that made her concerned her anemia is worsening.  She notes that her head started hurting, and that she was markedly tired.  She notes that she has been having some palpitations of the heart as well as shortness of breath.  She also continues to have ice cravings noting that she will intentionally make trips to stop [employer] just to have a cup of ice.  She notes that her fatigue has become more noticeable now that  she is at [facility] and constantly on her feet.  On further discussion she does note that her primary care provider has [facility]  She met with them on 04/18/2019 and they are currently in the process of working her up for her heavy OB/GYN bleeding.  The patient notes that she does continue to have heavy periods and that she is eager to get this under control.  She is agreeable to considering a hysterectomy.  On further review she denies having any fevers, chills, sweats, nausea, vomiting or diarrhea.  She denies any other overt signs or symptoms of bleeding.  A full 10 point ROS is listed below.  MEDICAL HISTORY:  Past Medical History:  Diagnosis Date  . Blood transfusion without reported diagnosis 2019  . [place]   . Fibroids   . Hypertension   . Iron deficiency anemia 04/27/2018  . Sleep apnea    Does not use CPAP    SURGICAL HISTORY: Past Surgical History:  Procedure Laterality Date  . GALLBLADDER SURGERY    . REPEAT CESAREAN SECTION      SOCIAL HISTORY: Social History   Socioeconomic History  . Marital status: Married    Spouse name: Not on file  . Number of children: 3  . Years of education: Not on file  . Highest education level: Not on file  Occupational History  . Not on file  Tobacco Use  . Smoking status: Never Smoker  .  Smokeless tobacco: Never Used  Substance and Sexual Activity  . Alcohol use: Never  . Drug use: Never  . Sexual activity: Not on file  Other Topics Concern  . Not on file  Social History Narrative  . Not on file   Social Determinants of Health   Financial Resource Strain:   . Difficulty of Paying Living Expenses:   Food Insecurity:   . Worried About Charity fundraiser in the Last Year:   . Arboriculturist in the Last Year:   Transportation Needs:   . Film/video editor (Medical):   Marland Kitchen Lack of Transportation (Non-Medical):    Physical Activity:   . Days of Exercise per Week:   . Minutes of Exercise per Session:   Stress:   . Feeling of Stress :   Social Connections:   . Frequency of Communication with Friends and Family:   . Frequency of Social Gatherings with Friends and Family:   . Attends Religious Services:   . Active Member of Clubs or Organizations:   . Attends Archivist Meetings:   Marland Kitchen Marital Status:   Intimate Partner Violence:   . Fear of Current or Ex-Partner:   . Emotionally Abused:   Marland Kitchen Physically Abused:   . Sexually Abused:     FAMILY HISTORY: Family History  Problem Relation Age of Onset  . Breast cancer Other   . Heart disease Mother   . Lupus Maternal Aunt   . Stomach cancer Maternal Aunt   . Colon cancer Neg Hx   . Esophageal cancer Neg Hx   . Rectal cancer Neg Hx     ALLERGIES:  is allergic to morphine and related.  MEDICATIONS:  Current Outpatient Medications  Medication Sig Dispense Refill  . albuterol (VENTOLIN HFA) 108 (90 Base) MCG/ACT inhaler Inhale 2 puffs into the lungs as needed.    . Fluticasone-Salmeterol (ADVAIR) 250-50 MCG/DOSE AEPB Inhale into the lungs.    . methocarbamol (ROBAXIN) 500 MG tablet Take 1 tablet (500 mg total) by mouth 2 (two) times daily. 20 tablet 0  . Multiple Vitamins-Minerals (HAIR SKIN AND NAILS FORMULA) TABS Take 1 tablet by mouth daily.    . pantoprazole (PROTONIX) 40 MG tablet Take 1 tablet (40 mg total) by mouth daily. 30 tablet 3   No current facility-administered medications for this visit.    REVIEW OF SYSTEMS:   Constitutional: ( - ) fevers, ( - )  chills , ( - ) night sweats Eyes: ( - ) blurriness of vision, ( - ) double vision, ( - ) watery eyes Ears, nose, mouth, throat, and face: ( - ) mucositis, ( - ) sore throat Respiratory: ( - ) cough, ( - ) dyspnea, ( - ) wheezes Cardiovascular: ( + ) palpitation, ( - ) chest discomfort, ( - ) lower extremity swelling Gastrointestinal:  ( - ) nausea, ( - ) heartburn, ( - )  change in bowel habits Skin: ( - ) abnormal skin rashes Lymphatics: ( - ) new lymphadenopathy, ( - ) easy bruising Neurological: ( - ) numbness, ( - ) tingling, ( - ) new weaknesses (+) headache Behavioral/Psych: ( - ) mood change, ( - ) new changes  All other systems were reviewed with the patient and are negative.  PHYSICAL EXAMINATION: ECOG PERFORMANCE STATUS: 1 - Symptomatic but completely ambulatory  Vitals:   05/15/19 0932  BP: 127/87  Pulse: 94  Resp: 18  Temp: 98.9 F (37.2 C)  SpO2: 100%  Filed Weights   05/15/19 0932  Weight: 241 lb 9.6 oz (109.6 kg)    GENERAL: well appearing obese African American female, alert, no distress and comfortable SKIN: skin color, texture, turgor are normal, no rashes or significant lesions EYES: conjunctiva are pale and non-injected, sclera clear LUNGS: clear to auscultation and percussion with normal breathing effort HEART: regular rate & rhythm and no murmurs and no lower extremity edema ABDOMEN: soft, non-tender, non-distended, normal bowel sounds Musculoskeletal: no cyanosis of digits and no clubbing  PSYCH: alert & oriented x 3, fluent speech NEURO: no focal motor/sensory deficits  LABORATORY DATA:  I have reviewed the data as listed CBC Latest Ref Rng & Units 05/15/2019 02/25/2019 01/01/2019  WBC 4.0 - 10.5 K/uL 8.9 9.5 9.0  Hemoglobin 12.0 - 15.0 g/dL 8.0(L) 9.5(L) 9.5(L)  Hematocrit 36.0 - 46.0 % 29.8(L) 32.2(L) 34.2(L)  Platelets 150 - 400 K/uL 572(H) 451(H) 530(H)    CMP Latest Ref Rng & Units 02/25/2019 01/01/2019 12/24/2018  Glucose 70 - 99 mg/dL 102(H) 101(H) 100(H)  BUN 6 - 20 mg/dL _0 Creatinine 0.44 - 1.00 mg/dL 0.80 0.79 0.97  Sodium 135 - 145 mmol/L 140 135 142  Potassium 3.5 - 5.1 mmol/L 3.8 4.1 3.9  Chloride 98 - 111 mmol/L 107 106 109  CO2 22 - 32 mmol/L _1 Calcium 8.9 - 10.3 mg/dL 9.0 8.9 8.9  Total Protein 6.5 - 8.1 g/dL 7.4 7.2 7.1  Total Bilirubin 0.3 - 1.2 mg/dL <0.2(L) 0.3 <0.2(L)   Alkaline Phos 38 - 126 U/L 66 60 75  AST 15 - 41 U/L 14(L) 20 14(L)  ALT 0 - 44 U/L _2 RADIOGRAPHIC STUDIES: I have personally reviewed the radiological images as listed and agreed with the findings in the report. No results found.  ASSESSMENT & PLAN Elaine Perez 34 y.o. female with medical history significant for iron deficiency anemia presents for a follow up visit.  On review of the labs and discussion with the patient her findings are still consistent with iron deficiency anemia secondary to heavy GYN blood loss.  Unfortunately in the interim the patient did not received the IV feraheme as scheduled.  On check today her hemoglobin has declined to 8.0.  At this time I would strongly recommend that we proceed with IV iron therapy with Feraheme 510 mg q. 7 days x 2 doses.  I strongly stressed to the patient the importance of assuring that she receives these IV iron doses.  Fortunately in the interim the patient has established with OB/GYN at [facility] and is currently undergoing evaluation for her heavy GYN bleeding.  I have made clear to the patient that control of her iron deficiency is twofold, initially we must stop the source of the bleeding and then we must replete the iron stores.  Given how low her hemoglobin has become I am concerned that her p.o. iron therapy alone is not substantial enough to increase her hemoglobin.  We will plan for her to receive a dose of IV iron on 05/20/2019 with a repeat dose on 05/27/2019.  We will have her follow-up in our clinic approximately 4 to 6 weeks following her last dose to assure that hemoglobin levels are rising appropriately.  #Iron Deficiency Anemia 2/2 Gyn Blood Loss --recheck CBC, CMP, reticulocyte and iron panels today --will restart feraheme 534m x 2 doses separated by 1 week. Strongly encourage patient to keep these appointments.  --patient reconnected with  Ob/Gyn for heavy cycles and fibroid pain in [place]  their management of the bleeding. --no clear indication for a GI referral at this time --RTC in 3 months to assess continued response to iron therapy  No orders of the defined types were placed in this encounter.  All questions were answered. The patient knows to call the clinic with any problems, questions or concerns.  A total of more than 30 minutes were spent on this encounter and over half of that time was spent on counseling and coordination of care as outlined above.   Ledell Peoples, MD Department of Hematology/Oncology [place] at [facility] Phone: [phone] Pager: [phone] Email: [email or web address]  05/15/2019 9:53 AM

## 2019-05-16 ENCOUNTER — Telehealth: Payer: Self-pay | Admitting: Hematology and Oncology

## 2019-05-16 NOTE — Telephone Encounter (Signed)
Scheduled per los. Called and spoke with patient. Confirmed appt 

## 2019-05-20 ENCOUNTER — Encounter (HOSPITAL_COMMUNITY): Payer: Self-pay

## 2019-05-20 ENCOUNTER — Emergency Department (HOSPITAL_COMMUNITY)
Admission: EM | Admit: 2019-05-20 | Discharge: 2019-05-20 | Disposition: A | Discharge status: Home / Self Care | Payer: Medicaid Other | Attending: Emergency Medicine | Admitting: Emergency Medicine

## 2019-05-20 ENCOUNTER — Other Ambulatory Visit: Payer: Self-pay

## 2019-05-20 DIAGNOSIS — Z7984 Long term (current) use of oral hypoglycemic drugs: Secondary | ICD-10-CM | POA: Diagnosis not present

## 2019-05-20 DIAGNOSIS — R531 Weakness: Secondary | ICD-10-CM | POA: Diagnosis present

## 2019-05-20 DIAGNOSIS — R5383 Other fatigue: Secondary | ICD-10-CM | POA: Diagnosis not present

## 2019-05-20 DIAGNOSIS — D649 Anemia, unspecified: Secondary | ICD-10-CM | POA: Diagnosis not present

## 2019-05-20 DIAGNOSIS — E119 Type 2 diabetes mellitus without complications: Secondary | ICD-10-CM | POA: Diagnosis not present

## 2019-05-20 DIAGNOSIS — Z79899 Other long term (current) drug therapy: Secondary | ICD-10-CM | POA: Diagnosis not present

## 2019-05-20 LAB — CBC WITH DIFFERENTIAL/PLATELET
Abs Immature Granulocytes: 0.05 10*3/uL (ref 0.00–0.07)
Basophils Absolute: 0.1 10*3/uL (ref 0.0–0.1)
Basophils Relative: 1 %
Eosinophils Absolute: 0.3 10*3/uL (ref 0.0–0.5)
Eosinophils Relative: 4 %
HCT: 28.2 % — ABNORMAL LOW (ref 36.0–46.0)
Hemoglobin: 7.3 g/dL — ABNORMAL LOW (ref 12.0–15.0)
Immature Granulocytes: 1 %
Lymphocytes Relative: 42 %
Lymphs Abs: 3.5 10*3/uL (ref 0.7–4.0)
MCH: 19.4 pg — ABNORMAL LOW (ref 26.0–34.0)
MCHC: 25.9 g/dL — ABNORMAL LOW (ref 30.0–36.0)
MCV: 74.8 fL — ABNORMAL LOW (ref 80.0–100.0)
Monocytes Absolute: 0.8 10*3/uL (ref 0.1–1.0)
Monocytes Relative: 10 %
Neutro Abs: 3.5 10*3/uL (ref 1.7–7.7)
Neutrophils Relative %: 42 %
Platelets: 456 10*3/uL — ABNORMAL HIGH (ref 150–400)
RBC: 3.77 MIL/uL — ABNORMAL LOW (ref 3.87–5.11)
RDW: 18.9 % — ABNORMAL HIGH (ref 11.5–15.5)
WBC: 8.3 10*3/uL (ref 4.0–10.5)
nRBC: 0.4 % — ABNORMAL HIGH (ref 0.0–0.2)

## 2019-05-20 LAB — BASIC METABOLIC PANEL
Anion gap: 6 (ref 5–15)
BUN: 13 mg/dL (ref 6–20)
CO2: 23 mmol/L (ref 22–32)
Calcium: 9 mg/dL (ref 8.9–10.3)
Chloride: 112 mmol/L — ABNORMAL HIGH (ref 98–111)
Creatinine, Ser: 0.86 mg/dL (ref 0.44–1.00)
GFR calc Af Amer: >60 mL/min (ref >60)
GFR calc non Af Amer: >60 mL/min (ref >60)
Glucose, Bld: 118 mg/dL — ABNORMAL HIGH (ref 70–99)
Potassium: 4.1 mmol/L (ref 3.5–5.1)
Sodium: 141 mmol/L (ref 135–145)

## 2019-05-20 LAB — I-STAT BETA HCG BLOOD, ED (MC, WL, AP ONLY): I-stat hCG, quantitative: <5 m[IU]/mL (ref <5)

## 2019-05-20 LAB — TYPE AND SCREEN
ABO/RH(D): B POS
Antibody Screen: NEGATIVE

## 2019-05-20 LAB — ABO/RH: ABO/RH(D): B POS

## 2019-05-20 MED ORDER — ACETAMINOPHEN 500 MG PO TABS
1000.0000 mg | ORAL_TABLET | Freq: Once | ORAL | Status: AC
  Administered 2019-05-20: 1000 mg via ORAL
  Filled 2019-05-20: qty 2

## 2019-05-20 MED ORDER — SODIUM CHLORIDE 0.9 % IV BOLUS
500.0000 mL | Freq: Once | INTRAVENOUS | Status: AC
  Administered 2019-05-20: 500 mL via INTRAVENOUS

## 2019-05-20 NOTE — Discharge Instructions (Signed)
Please return for any problem.  Follow-up with your hematologist tomorrow.  You are being scheduled for a blood transfusion tomorrow morning.

## 2019-05-20 NOTE — ED Provider Notes (Signed)
[facility] DEPT Provider Note   CSN: [id number] Arrival date & time: 05/20/19  1453     History Chief Complaint  Patient presents with  . Abnormal Lab    Elaine Perez is a 34 y.o. female.  34 year old female with prior medical history as detailed below presents for evaluation of weakness and fatigue.  Patient reports worsening weakness and fatigue over the last 1 to 2 weeks.  She reports a prior history of anemia.  She reports prior history of iron and blood transfusions.  She reports that her anemia is secondary to heavy menses.  She is concerned today that her hemoglobin may have gotten low enough that she needs a blood transfusion.  She is not currently having vaginal bleeding.  She does expect another period to start within the next 1 to 2 weeks.  The history is provided by the patient and medical records.  Illness Location:  Weakness, anemia  Severity:  Mild Onset quality:  Gradual Duration:  2 weeks Timing:  Constant Progression:  Worsening Chronicity:  New Associated symptoms: fatigue   Associated symptoms: no abdominal pain and no fever        Past Medical History:  Diagnosis Date  . Blood transfusion without reported diagnosis 2019  . Diabetes mellitus without complication ([place])   . Fibroids   . Hypertension   . Iron deficiency anemia 04/27/2018  . Sleep apnea    Does not use CPAP    Patient Active Problem List   Diagnosis Date Noted  . Iron deficiency anemia due to chronic blood loss 08/31/2018  . Iron deficiency anemia 04/27/2018    Past Surgical History:  Procedure Laterality Date  . GALLBLADDER SURGERY    . REPEAT CESAREAN SECTION       OB History   No obstetric history on file.     Family History  Problem Relation Age of Onset  . Breast cancer Other   . Heart disease Mother   . Lupus Maternal Aunt   . Stomach cancer Maternal Aunt   . Colon cancer Neg Hx   . Esophageal cancer Neg Hx   . Rectal cancer Neg  Hx     Social History   Tobacco Use  . Smoking status: Never Smoker  . Smokeless tobacco: Never Used  Substance Use Topics  . Alcohol use: Never  . Drug use: Never    Home Medications Prior to Admission medications   Medication Sig Start Date End Date Taking? Authorizing Provider  albuterol (VENTOLIN HFA) 108 (90 Base) MCG/ACT inhaler Inhale 1-2 puffs into the lungs every 6 (six) hours as needed for wheezing or shortness of breath.  06/15/10  Yes [provider]  diclofenac (VOLTAREN) 75 MG EC tablet Take 75 mg by mouth 2 (two) times daily as needed for mild pain or moderate pain.  03/07/19  Yes [provider]  escitalopram (LEXAPRO) 10 MG tablet Take 10 mg by mouth daily. 04/01/19  Yes [provider]  famotidine (PEPCID) 20 MG tablet Take 20 mg by mouth daily.  03/12/19  Yes [provider]  Ferrous Sulfate (IRON PO) Take 1 tablet by mouth daily.   Yes [provider]  Fluticasone-Salmeterol (ADVAIR) 250-50 MCG/DOSE AEPB Inhale 1 puff into the lungs daily.  05/23/18  Yes [provider]  hydrochlorothiazide (HYDRODIURIL) 25 MG tablet Take 25 mg by mouth daily. 03/12/19  Yes [provider]  HYDROcodone-acetaminophen (NORCO/VICODIN) 5-325 MG tablet Take 1 tablet by mouth every 6 (six)  hours as needed for moderate pain or severe pain.  03/21/19  Yes [provider]  metFORMIN (GLUCOPHAGE) 500 MG tablet Take 500 mg by mouth 2 (two) times daily. 04/01/19  Yes [provider]  Multiple Vitamins-Minerals (HAIR SKIN AND NAILS FORMULA) TABS Take 1 tablet by mouth daily.   Yes [provider]  pantoprazole (PROTONIX) 40 MG tablet Take 1 tablet (40 mg total) by mouth daily. 10/04/18  Yes Thornton Park, MD  methocarbamol (ROBAXIN) 500 MG tablet Take 1 tablet (500 mg total) by mouth 2 (two) times daily. Patient not taking: Reported on 05/20/2019 03/05/19   [place]idence Lanius A, PA-C    Allergies    Morphine and  related  Review of Systems   Review of Systems  Constitutional: Positive for fatigue. Negative for fever.  Gastrointestinal: Negative for abdominal pain.  All other systems reviewed and are negative.   Physical Exam Updated Vital Signs BP 136/83   Pulse 65   Temp 98.4 F (36.9 C) (Oral)   Resp 18   LMP 05/05/2019 (Approximate)   SpO2 100%   Physical Exam Vitals and nursing note reviewed.  Constitutional:      General: She is not in acute distress.    Appearance: Normal appearance. She is well-developed.  HENT:     Head: Normocephalic and atraumatic.  Eyes:     Conjunctiva/sclera: Conjunctivae normal.     Pupils: Pupils are equal, round, and reactive to light.  Cardiovascular:     Rate and Rhythm: Normal rate and regular rhythm.     Heart sounds: Normal heart sounds.  Pulmonary:     Effort: Pulmonary effort is normal. No respiratory distress.     Breath sounds: Normal breath sounds.  Abdominal:     General: There is no distension.     Palpations: Abdomen is soft.     Tenderness: There is no abdominal tenderness.  Musculoskeletal:        General: No deformity. Normal range of motion.     Cervical back: Normal range of motion and neck supple.  Skin:    General: Skin is warm and dry.  Neurological:     General: No focal deficit present.     Mental Status: She is alert and oriented to person, place, and time.     ED Results / Procedures / Treatments   Labs (all labs ordered are listed, but only abnormal results are displayed) Labs Reviewed  BASIC METABOLIC PANEL - Abnormal; Notable for the following components:      Result Value   Chloride 112 (*)    Glucose, Bld 118 (*)    All other components within normal limits  CBC WITH DIFFERENTIAL/PLATELET - Abnormal; Notable for the following components:   RBC 3.77 (*)    Hemoglobin 7.3 (*)    HCT 28.2 (*)    MCV 74.8 (*)    MCH 19.4 (*)    MCHC 25.9 (*)    RDW 18.9 (*)    Platelets 456 (*)    nRBC 0.4 (*)     All other components within normal limits  I-STAT BETA HCG BLOOD, ED ([facility], [facility], [facility] ONLY)  TYPE AND SCREEN    EKG None  Radiology No results found.  Procedures Procedures (including critical care time)  Medications Ordered in ED Medications  sodium chloride 0.9 % bolus 500 mL (500 mLs Intravenous New Bag/Given 05/20/19 1606)  acetaminophen (TYLENOL) tablet 1,000 mg (1,000 mg Oral Given 05/20/19 1552)    ED Course  I have reviewed the triage vital signs and the nursing notes.  Pertinent labs & imaging results that were available during my care of the patient were reviewed by me and considered in my medical decision making (see chart for details).    MDM Rules/Calculators/A&P                      MDM  Screen complete  Honestie Souva was evaluated in Emergency Department on 05/20/2019 for the symptoms described in the history of present illness. She was evaluated in the context of the global COVID-19 pandemic, which necessitated consideration that the patient might be at risk for infection with the SARS-CoV-2 virus that causes COVID-19. Institutional protocols and algorithms that pertain to the evaluation of patients at risk for COVID-19 are in a state of rapid change based on information released by regulatory bodies including the CDC and federal and state organizations. These policies and algorithms were followed during the patient's care in the ED.   Patient is presenting for evaluation of worsening weakness and anemia.  Patient symptoms are most likely secondary to symptomatic anemia.  Patient's hemoglobin today is 7.3.  She is known to [facility] hematology.  Case discussed with Dr. Benay Spice.  He agrees with plan for outpatient blood transfusion tomorrow morning.  Patient is in agreement with this plan.  She declines transfusion in the ED.  She does understand the need for close follow-up with hematology tomorrow (at the transfusion clinic).  Strict return precautions given and  understood.    Final Clinical Impression(s) / ED Diagnoses Final diagnoses:  Symptomatic anemia    Rx / DC Orders ED Discharge Orders    None       Valarie Merino, MD 05/20/19 1743

## 2019-05-20 NOTE — ED Triage Notes (Signed)
Pt presents with c/o low hemoglobin. Pt reports her hemoglobin was checked at the cancer center and it was 8. Pt reports she has a headache, her extremities are cold, and she has been sleeping more than usual.

## 2019-05-21 ENCOUNTER — Ambulatory Visit: Payer: Medicaid Other

## 2019-05-21 ENCOUNTER — Inpatient Hospital Stay: Payer: Medicaid Other | Attending: Internal Medicine

## 2019-05-21 ENCOUNTER — Other Ambulatory Visit: Payer: Self-pay

## 2019-05-21 ENCOUNTER — Other Ambulatory Visit: Payer: Self-pay | Admitting: Nurse Practitioner

## 2019-05-21 ENCOUNTER — Inpatient Hospital Stay: Payer: Medicaid Other

## 2019-05-21 ENCOUNTER — Inpatient Hospital Stay (HOSPITAL_BASED_OUTPATIENT_CLINIC_OR_DEPARTMENT_OTHER): Payer: Medicaid Other | Admitting: Nurse Practitioner

## 2019-05-21 ENCOUNTER — Other Ambulatory Visit: Payer: Self-pay | Admitting: Emergency Medicine

## 2019-05-21 VITALS — BP 129/78 | HR 87 | Temp 98.0°F | Resp 18 | Ht 61.0 in | Wt 244.2 lb

## 2019-05-21 DIAGNOSIS — D500 Iron deficiency anemia secondary to blood loss (chronic): Secondary | ICD-10-CM | POA: Diagnosis not present

## 2019-05-21 DIAGNOSIS — N920 Excessive and frequent menstruation with regular cycle: Secondary | ICD-10-CM | POA: Diagnosis not present

## 2019-05-21 DIAGNOSIS — D509 Iron deficiency anemia, unspecified: Secondary | ICD-10-CM | POA: Insufficient documentation

## 2019-05-21 LAB — ABO/RH: ABO/RH(D): B POS

## 2019-05-21 LAB — SAMPLE TO BLOOD BANK

## 2019-05-21 LAB — PREPARE RBC (CROSSMATCH)

## 2019-05-21 MED ORDER — DIPHENHYDRAMINE HCL 25 MG PO CAPS
25.0000 mg | ORAL_CAPSULE | Freq: Once | ORAL | Status: AC
  Administered 2019-05-21: 25 mg via ORAL

## 2019-05-21 MED ORDER — ACETAMINOPHEN 325 MG PO TABS
ORAL_TABLET | ORAL | Status: AC
  Filled 2019-05-21: qty 2

## 2019-05-21 MED ORDER — IBUPROFEN 200 MG PO TABS
400.0000 mg | ORAL_TABLET | Freq: Once | ORAL | Status: AC
  Administered 2019-05-21: 400 mg via ORAL

## 2019-05-21 MED ORDER — DIPHENHYDRAMINE HCL 25 MG PO CAPS
ORAL_CAPSULE | ORAL | Status: AC
  Filled 2019-05-21: qty 1

## 2019-05-21 MED ORDER — ACETAMINOPHEN 325 MG PO TABS
650.0000 mg | ORAL_TABLET | Freq: Once | ORAL | Status: AC
  Administered 2019-05-21: 650 mg via ORAL

## 2019-05-21 MED ORDER — SODIUM CHLORIDE 0.9% IV SOLUTION
250.0000 mL | Freq: Once | INTRAVENOUS | Status: AC
  Administered 2019-05-21: 250 mL via INTRAVENOUS
  Filled 2019-05-21: qty 250

## 2019-05-21 MED ORDER — IBUPROFEN 200 MG PO TABS
ORAL_TABLET | ORAL | Status: AC
  Filled 2019-05-21: qty 2

## 2019-05-21 NOTE — Progress Notes (Signed)
NP Lattie Haw aware of drawing only Type & screen labs from pt, unable to get LDH/CBC at this time.  OK per NP to cancel additional labs.  Lab made aware.  Pt received 1 unit PRBCs today, tolerated well.  Pt had some dry heaves before starting any tx today but declined any antiemetics as it resolved on its own.  VSS.  Able to drink fluids and ambulate to restroom during transfusion, declined any food.  Pt states she will have someone take her home today from [facility] after receiving IV benadryl.  Pt verbalizes understanding to check for signs of transfusion reaction at home (fever/chills, back pain, rash, SOB, wheezing) and to call the [facility] for advisement or visit the ED.

## 2019-05-21 NOTE — Patient Instructions (Signed)

## 2019-05-21 NOTE — Progress Notes (Addendum)
  [facility] OFFICE PROGRESS NOTE   Diagnosis: Iron deficiency anemia  INTERVAL HISTORY:   Elaine Perez is a 34 year old woman with iron deficiency anemia followed by Dr. Lorenso Courier.  She was last seen by Dr. Lorenso Courier on 05/15/2019 at which time the hemoglobin was 8, MCV 73.2, ferritin less than 4.  She was scheduled to receive IV iron 05/20/2019.  She was seen in the emergency room yesterday with weakness and fatigue.  Hemoglobin returned at 7.3.  She reports a headache, nausea and shortness of breath over the past week.  She has had similar symptoms when anemic in the past.  Energy level is poor.  She denies bleeding at present but reports she will be starting her menstrual cycle soon.  She has chronic left-sided abdominal pain.  She feels cold.  No fever.  Objective:  Vital signs in last 24 hours:  Blood pressure 129/78, pulse 87, temperature 98 F (36.7 C), temperature source Temporal, resp. rate 18, height 5\' 1"  (1.549 m), weight 244 lb 3.2 oz (110.8 kg), last menstrual period 05/05/2019, SpO2 100 %.    HEENT: Conjunctival pallor. Resp: Lungs clear bilaterally. Cardio: Regular rate and rhythm. GI: Abdomen is soft.  No hepatomegaly.  Tender left mid abdomen. Vascular: No leg edema. Neuro: Alert and oriented.   Lab Results:  Lab Results  Component Value Date   WBC 8.3 05/20/2019   HGB 7.3 (L) 05/20/2019   HCT 28.2 (L) 05/20/2019   MCV 74.8 (L) 05/20/2019   PLT 456 (H) 05/20/2019   NEUTROABS 3.5 05/20/2019    Imaging:  No results found.  Medications: I have reviewed the patient's current medications.  Assessment/Plan: 1. Iron deficiency anemia secondary to menorrhagia 2. Menorrhagia, followed by GYN  Disposition: Elaine Perez has a history of iron deficiency anemia felt to be secondary to menstrual blood loss.  She is undergoing GYN evaluation at [facility] and thinks a hysterectomy is being considered.  She was seen in the emergency department yesterday.  Hemoglobin  returned at 7.3.  She appears symptomatic.  She would like to proceed with a blood transfusion.  We discussed potential risks including an allergic reaction, infection.  She agrees to proceed.  She will receive a unit of blood today.  She will try increasing oral iron to 3 times a day.  She will return for repeat labs, follow-up with Dr. Lorenso Courier and IV iron on 05/27/2019.  She will contact the office in the interim with further problems.  Patient seen with Dr. Benay Spice.    Ned Card ANP/GNP-BC   05/21/2019  12:34 PM This was a shared visit with Ned Card.  Elaine Perez has iron deficiency anemia.  This appears to be secondary to menorrhagia.  She is followed by Dr. Lorenso Courier.  She would like a red cell transfusion today.  We will transfuse 1 unit of packed red blood cells for treatment of symptomatic anemia.  We recommended she increase the ferrous sulfate.  Julieanne Manson, MD

## 2019-05-22 LAB — TYPE AND SCREEN
ABO/RH(D): B POS
Antibody Screen: NEGATIVE
Unit division: 0

## 2019-05-22 LAB — BPAM RBC
Blood Product Expiration Date: 202104302359
ISSUE DATE / TIME: 202104061417
Unit Type and Rh: 7300

## 2019-05-23 ENCOUNTER — Telehealth: Payer: Self-pay | Admitting: Hematology and Oncology

## 2019-05-23 NOTE — Telephone Encounter (Signed)
Added lab appt ot 4/12 per 4/6 los. Called and left msg

## 2019-05-27 ENCOUNTER — Other Ambulatory Visit: Payer: Self-pay

## 2019-05-27 ENCOUNTER — Other Ambulatory Visit: Payer: Medicaid Other

## 2019-05-27 ENCOUNTER — Ambulatory Visit: Payer: Medicaid Other | Admitting: Hematology and Oncology

## 2019-05-27 ENCOUNTER — Telehealth: Payer: Self-pay | Admitting: *Deleted

## 2019-05-27 ENCOUNTER — Ambulatory Visit: Payer: Medicaid Other

## 2019-05-27 NOTE — Telephone Encounter (Signed)
Received message from on call service that patient called about her appt for this am, asking for the time of appt. TCT patient and advised that her appt was at 8am. With iron infusion @ 8:45. Pt states that she was here after 8am (she had to drop her children off prior to appt)  She states she was told at the front desk that since she was late, she could not have her infusion. This writer was not made aware of this.  Pt is fine with getting her appts re-scheduled for Tuesday or Wednesday of this week. High priority message sent.  Pt aware that a scheduler will call her for date and time.  Dr. Lorenso Courier aware.

## 2019-05-28 ENCOUNTER — Other Ambulatory Visit: Payer: Self-pay | Admitting: *Deleted

## 2019-05-28 DIAGNOSIS — D509 Iron deficiency anemia, unspecified: Secondary | ICD-10-CM

## 2019-05-28 NOTE — Progress Notes (Signed)
Per Dr. Lorenso Courier patient needs CBC and Sample to blood bank for tomorrow's appt.  She does not need to be seen by him prior to her Iron infusion.  Orders entered.

## 2019-05-29 ENCOUNTER — Inpatient Hospital Stay: Payer: Medicaid Other

## 2019-05-29 ENCOUNTER — Other Ambulatory Visit: Payer: Self-pay

## 2019-05-29 VITALS — BP 141/75 | HR 67 | Temp 98.7°F | Resp 18

## 2019-05-29 DIAGNOSIS — D500 Iron deficiency anemia secondary to blood loss (chronic): Secondary | ICD-10-CM

## 2019-05-29 DIAGNOSIS — D509 Iron deficiency anemia, unspecified: Secondary | ICD-10-CM

## 2019-05-29 LAB — CBC WITH DIFFERENTIAL (CANCER CENTER ONLY)
Abs Immature Granulocytes: 0.12 10*3/uL — ABNORMAL HIGH (ref 0.00–0.07)
Basophils Absolute: 0.1 10*3/uL (ref 0.0–0.1)
Basophils Relative: 1 %
Eosinophils Absolute: 0.3 10*3/uL (ref 0.0–0.5)
Eosinophils Relative: 4 %
HCT: 30 % — ABNORMAL LOW (ref 36.0–46.0)
Hemoglobin: 8.1 g/dL — ABNORMAL LOW (ref 12.0–15.0)
Immature Granulocytes: 1 %
Lymphocytes Relative: 26 %
Lymphs Abs: 2.4 10*3/uL (ref 0.7–4.0)
MCH: 19.9 pg — ABNORMAL LOW (ref 26.0–34.0)
MCHC: 27 g/dL — ABNORMAL LOW (ref 30.0–36.0)
MCV: 73.5 fL — ABNORMAL LOW (ref 80.0–100.0)
Monocytes Absolute: 0.7 10*3/uL (ref 0.1–1.0)
Monocytes Relative: 8 %
Neutro Abs: 5.8 10*3/uL (ref 1.7–7.7)
Neutrophils Relative %: 60 %
Platelet Count: 404 10*3/uL — ABNORMAL HIGH (ref 150–400)
RBC: 4.08 MIL/uL (ref 3.87–5.11)
RDW: 19.9 % — ABNORMAL HIGH (ref 11.5–15.5)
WBC Count: 9.4 10*3/uL (ref 4.0–10.5)
nRBC: 0.6 % — ABNORMAL HIGH (ref 0.0–0.2)

## 2019-05-29 LAB — SAMPLE TO BLOOD BANK

## 2019-05-29 MED ORDER — SODIUM CHLORIDE 0.9 % IV SOLN
510.0000 mg | Freq: Once | INTRAVENOUS | Status: AC
  Administered 2019-05-29: 510 mg via INTRAVENOUS
  Filled 2019-05-29: qty 510

## 2019-05-29 MED ORDER — SODIUM CHLORIDE 0.9 % IV SOLN
Freq: Once | INTRAVENOUS | Status: DC
  Filled 2019-05-29: qty 250

## 2019-05-29 MED ORDER — SODIUM CHLORIDE 0.9 % IV SOLN
INTRAVENOUS | Status: DC
  Filled 2019-05-29: qty 250

## 2019-05-29 NOTE — Progress Notes (Signed)
No premedications required today per Dr. Lorenso Courier. Supportive therapy plan to be updated.   Demetrius Charity, PharmD, BCPS, [facility] Pharmac[facility] Phone: [phone] 05/29/2019

## 2019-05-29 NOTE — Patient Instructions (Signed)

## 2019-06-04 ENCOUNTER — Telehealth: Payer: Self-pay | Admitting: *Deleted

## 2019-06-04 NOTE — Telephone Encounter (Signed)
Patient called to question symptom that has developed of sudden sharp shooting pain from lower back around to abdomen.  She was concerned that it could be related to recent blood transfusion.  Confirmed with Dr Lorenso Courier no concern for delayed reaction and pain doesn't appear to be associated with previous blood transfusion.  Advised to seek follow up with PCP.    Patient made aware.

## 2019-07-03 ENCOUNTER — Telehealth: Payer: Self-pay | Admitting: Hematology and Oncology

## 2019-07-03 ENCOUNTER — Telehealth: Payer: Self-pay | Admitting: *Deleted

## 2019-07-03 NOTE — Telephone Encounter (Signed)
Scheduled appt per 5/19 sch message - pt aware and reminder letter mailed

## 2019-07-03 NOTE — Telephone Encounter (Signed)
TCT patient regarding appts tomorrow with Dr. Janeann Forehand see if she could come in earlier for lab work. Pt states she is in the hospital @ [facility]. She states she had a hysterectomy done yesterday. Advised that I would have her appts re-scheduled for 6 weeks. From now. Pt voiced understanding and was in agreement

## 2019-07-04 ENCOUNTER — Other Ambulatory Visit: Payer: Medicaid Other

## 2019-07-04 ENCOUNTER — Inpatient Hospital Stay: Payer: Medicaid Other | Admitting: Hematology and Oncology

## 2019-07-04 DIAGNOSIS — R768 Other specified abnormal immunological findings in serum: Secondary | ICD-10-CM | POA: Insufficient documentation

## 2019-07-30 ENCOUNTER — Other Ambulatory Visit (HOSPITAL_COMMUNITY): Payer: Self-pay | Admitting: Internal Medicine

## 2019-07-30 ENCOUNTER — Ambulatory Visit (HOSPITAL_COMMUNITY)
Admission: RE | Admit: 2019-07-30 | Discharge: 2019-07-30 | Disposition: A | Discharge status: Home / Self Care | Payer: Medicaid Other | Source: Ambulatory Visit | Attending: Internal Medicine | Admitting: Internal Medicine

## 2019-07-30 ENCOUNTER — Other Ambulatory Visit: Payer: Self-pay

## 2019-07-30 DIAGNOSIS — R609 Edema, unspecified: Secondary | ICD-10-CM | POA: Diagnosis present

## 2019-07-30 DIAGNOSIS — Z90710 Acquired absence of both cervix and uterus: Secondary | ICD-10-CM | POA: Insufficient documentation

## 2019-07-30 DIAGNOSIS — M79604 Pain in right leg: Secondary | ICD-10-CM | POA: Insufficient documentation

## 2019-07-30 NOTE — Progress Notes (Signed)
Right lower extremity venous duplex completed. Refer to "CV Proc" under chart review to view preliminary results.  07/30/2019 5:26 PM Kelby Aline., MHA, RVT, RDCS, RDMS

## 2019-08-13 ENCOUNTER — Other Ambulatory Visit: Payer: Self-pay | Admitting: Hematology and Oncology

## 2019-08-13 DIAGNOSIS — D500 Iron deficiency anemia secondary to blood loss (chronic): Secondary | ICD-10-CM

## 2019-08-13 NOTE — Progress Notes (Deleted)
[place] [phone]   [phone]  PROGRESS NOTE  Patient Care Team: [name], MD as PCP - General (Internal Medicine)  Hematological/Oncological History #Iron Deficiency Anemia Secondary to GYN Bleeding.  A)34 year old female referred for evaluation due to persistent iron deficiency anemia. Pt was seen at [facility] and was treated in the past with IV iron. Pt has an intolerance for oral iron. She has been told she has uterine fibroids. B) Feraheme infusionson 05/04/18, 06/01/18, and 11/09/18 C) 12/24/2018: establish care with Dr. Lorenso Courier  D) 01/04/2019: 1 dose of Feraheme 510mg  IV. Missed 2nd dose E) 02/25/2019: seen in clinic. Scheduled for an additional 2 doses of feraheme. Patient no showed to these visits. WBC 9.5, Hgb 9.5, MCV 79.9, Plt 451 F) 05/15/2019: patient requested urgent visit due to symptoms of anemia. WBC 8.9, Hgb 8.0, MCV 73.2, Plt 572 G) 05/21/2019-05/29/2019: Feraheme 510mg  IV x 2 doses H) 07/02/2019: TAH/LS/RSO performed at [facility] for heavy menstrual cycles. Hgb 9.2 I) 08/14/2019: return visit ***  Interval History:  Elaine Perez 34 y.o. female with medical history significant for iron deficiency anemia who presents for a follow up visit. The patient's last visit was on 05/15/2019. In the interim since the last visit the patient completed 2 doses of IV Feraheme in early April and underwent a total abdominal hysterectomy on 07/02/2019.  On exam today Elaine Perez notes that ***  MEDICAL HISTORY:  Past Medical History:  Diagnosis Date  . Blood transfusion without reported diagnosis 2019  . Diabetes mellitus without complication ([place])   . Fibroids   . Hypertension   . Iron deficiency anemia 04/27/2018  . Sleep apnea    Does not use CPAP    SURGICAL HISTORY: Past Surgical History:  Procedure Laterality Date  . GALLBLADDER SURGERY    . REPEAT CESAREAN SECTION      SOCIAL HISTORY: Social History    Socioeconomic History  . Marital status: Married    Spouse name: Not on file  . Number of children: 3  . Years of education: Not on file  . Highest education level: Not on file  Occupational History  . Not on file  Tobacco Use  . Smoking status: Never Smoker  . Smokeless tobacco: Never Used  Vaping Use  . Vaping Use: Never used  Substance and Sexual Activity  . Alcohol use: Never  . Drug use: Never  . Sexual activity: Not on file  Other Topics Concern  . Not on file  Social History Narrative  . Not on file   Social Determinants of Health   Financial Resource Strain:   . Difficulty of Paying Living Expenses:   Food Insecurity:   . Worried About Charity fundraiser in the Last Year:   . Arboriculturist in the Last Year:   Transportation Needs:   . Film/video editor (Medical):   Marland Kitchen Lack of Transportation (Non-Medical):   Physical Activity:   . Days of Exercise per Week:   . Minutes of Exercise per Session:   Stress:   . Feeling of Stress :   Social Connections:   . Frequency of Communication with Friends and Family:   . Frequency of Social Gatherings with Friends and Family:   . Attends Religious Services:   . Active Member of Clubs or Organizations:   . Attends Archivist Meetings:   Marland Kitchen Marital Status:   Intimate Partner Violence:   . Fear of Current or  Ex-Partner:   . Emotionally Abused:   Marland Kitchen Physically Abused:   . Sexually Abused:     FAMILY HISTORY: Family History  Problem Relation Age of Onset  . Breast cancer Other   . Heart disease Mother   . Lupus Maternal Aunt   . Stomach cancer Maternal Aunt   . Colon cancer Neg Hx   . Esophageal cancer Neg Hx   . Rectal cancer Neg Hx     ALLERGIES:  is allergic to morphine and related.  MEDICATIONS:  Current Outpatient Medications  Medication Sig Dispense Refill  . albuterol (VENTOLIN HFA) 108 (90 Base) MCG/ACT inhaler Inhale 1-2 puffs into the lungs every 6 (six) hours as needed for wheezing  or shortness of breath.     . diclofenac (VOLTAREN) 75 MG EC tablet Take 75 mg by mouth 2 (two) times daily as needed for mild pain or moderate pain.     Marland Kitchen escitalopram (LEXAPRO) 10 MG tablet Take 10 mg by mouth daily.    . famotidine (PEPCID) 20 MG tablet Take 20 mg by mouth daily.     . Ferrous Sulfate (IRON PO) Take 1 tablet by mouth daily.    . Fluticasone-Salmeterol (ADVAIR) 250-50 MCG/DOSE AEPB Inhale 1 puff into the lungs daily.     . hydrochlorothiazide (HYDRODIURIL) 25 MG tablet Take 25 mg by mouth daily.    Marland Kitchen HYDROcodone-acetaminophen (NORCO/VICODIN) 5-325 MG tablet Take 1 tablet by mouth every 6 (six) hours as needed for moderate pain or severe pain.     . metFORMIN (GLUCOPHAGE) 500 MG tablet Take 500 mg by mouth 2 (two) times daily.    . methocarbamol (ROBAXIN) 500 MG tablet Take 1 tablet (500 mg total) by mouth 2 (two) times daily. 20 tablet 0  . Multiple Vitamins-Minerals (HAIR SKIN AND NAILS FORMULA) TABS Take 1 tablet by mouth daily.    . pantoprazole (PROTONIX) 40 MG tablet Take 1 tablet (40 mg total) by mouth daily. 30 tablet 3   No current facility-administered medications for this visit.    REVIEW OF SYSTEMS:   Constitutional: ( - ) fevers, ( - )  chills , ( - ) night sweats Eyes: ( - ) blurriness of vision, ( - ) double vision, ( - ) watery eyes Ears, nose, mouth, throat, and face: ( - ) mucositis, ( - ) sore throat Respiratory: ( - ) cough, ( - ) dyspnea, ( - ) wheezes Cardiovascular: ( + ) palpitation, ( - ) chest discomfort, ( - ) lower extremity swelling Gastrointestinal:  ( - ) nausea, ( - ) heartburn, ( - ) change in bowel habits Skin: ( - ) abnormal skin rashes Lymphatics: ( - ) new lymphadenopathy, ( - ) easy bruising Neurological: ( - ) numbness, ( - ) tingling, ( - ) new weaknesses (+) headache Behavioral/Psych: ( - ) mood change, ( - ) new changes  All other systems were reviewed with the patient and are negative.  PHYSICAL EXAMINATION: ECOG PERFORMANCE  STATUS: 1 - Symptomatic but completely ambulatory  There were no vitals filed for this visit. There were no vitals filed for this visit.  GENERAL: well appearing obese African American female, alert, no distress and comfortable SKIN: skin color, texture, turgor are normal, no rashes or significant lesions EYES: conjunctiva are pale and non-injected, sclera clear LUNGS: clear to auscultation and percussion with normal breathing effort HEART: regular rate & rhythm and no murmurs and no lower extremity edema ABDOMEN: soft, non-tender, non-distended, normal bowel sounds  Musculoskeletal: no cyanosis of digits and no clubbing  PSYCH: alert & oriented x 3, fluent speech NEURO: no focal motor/sensory deficits  LABORATORY DATA:  I have reviewed the data as listed CBC Latest Ref Rng & Units 05/29/2019 05/20/2019 05/15/2019  WBC 4.0 - 10.5 K/uL 9.4 8.3 8.9  Hemoglobin 12.0 - 15.0 g/dL 8.1(L) 7.3(L) 8.0(L)  Hematocrit 36 - 46 % 30.0(L) 28.2(L) 29.8(L)  Platelets 150 - 400 K/uL 404(H) 456(H) 572(H)    CMP Latest Ref Rng & Units 05/20/2019 05/15/2019 02/25/2019  Glucose 70 - 99 mg/dL 118(H) 168(H) 102(H)  BUN 6 - 20 mg/dL 13 11 12   Creatinine 0.44 - 1.00 mg/dL 0.86 0.86 0.80  Sodium 135 - 145 mmol/L 141 139 140  Potassium 3.5 - 5.1 mmol/L 4.1 4.2 3.8  Chloride 98 - 111 mmol/L 112(H) 109 107  CO2 22 - 32 mmol/L 23 21(L) 24  Calcium 8.9 - 10.3 mg/dL 9.0 9.1 9.0  Total Protein 6.5 - 8.1 g/dL - 7.8 7.4  Total Bilirubin 0.3 - 1.2 mg/dL - <0.2(L) <0.2(L)  Alkaline Phos 38 - 126 U/L - 78 66  AST 15 - 41 U/L - 13(L) 14(L)  ALT 0 - 44 U/L - 18 18    RADIOGRAPHIC STUDIES: I have personally reviewed the radiological images as listed and agreed with the findings in the report. VAS [place] LOWER EXTREMITY VENOUS (DVT)  Result Date: 07/30/2019  Lower Venous DVTStudy Indications: Pain, Edema, and s/p hysterectomy x approx. 1 month.  Limitations: Body habitus. Comparison Study: No prior study Performing  Technologist: Maudry Mayhew MHA, RDMS, RVT, RDCS  Examination Guidelines: A complete evaluation includes B-mode imaging, spectral Doppler, color Doppler, and power Doppler as needed of all accessible portions of each vessel. Bilateral testing is considered an integral part of a complete examination. Limited examinations for reoccurring indications may be performed as noted. The reflux portion of the exam is performed with the patient in reverse Trendelenburg.  +---------+---------------+---------+-----------+----------+--------------+ RIGHT    CompressibilityPhasicitySpontaneityPropertiesThrombus Aging +---------+---------------+---------+-----------+----------+--------------+ CFV      Full           Yes      Yes                                 +---------+---------------+---------+-----------+----------+--------------+ FV Prox  Full                                                        +---------+---------------+---------+-----------+----------+--------------+ FV Mid   Full                                                        +---------+---------------+---------+-----------+----------+--------------+ FV DistalFull                                                        +---------+---------------+---------+-----------+----------+--------------+ PFV      Full                                                        +---------+---------------+---------+-----------+----------+--------------+  POP      Full           Yes      Yes                                 +---------+---------------+---------+-----------+----------+--------------+ PTV      Full                                                        +---------+---------------+---------+-----------+----------+--------------+ PERO     Full                                                        +---------+---------------+---------+-----------+----------+--------------+   Right Technical Findings: Not  visualized segments include SFJ.  +----+---------------+---------+-----------+----------+--------------+ LEFTCompressibilityPhasicitySpontaneityPropertiesThrombus Aging +----+---------------+---------+-----------+----------+--------------+ CFV Full           Yes      Yes                                 +----+---------------+---------+-----------+----------+--------------+     Summary: RIGHT: - There is no evidence of deep vein thrombosis in the lower extremity. However, portions of this examination were limited- see technologist comments above.  - No cystic structure found in the popliteal fossa.  LEFT: - No evidence of common femoral vein obstruction.  *See table(s) above for measurements and observations. Electronically signed by Deitra Mayo MD on 07/30/2019 at 74:19:46 PM.    Final     ASSESSMENT & PLAN Elaine Perez 34 y.o. female with medical history significant for iron deficiency anemia presents for a follow up visit.  On review of the labs and discussion with the patient her findings are still consistent with iron deficiency anemia secondary to heavy GYN blood loss.  ***   #Iron Deficiency Anemia 2/2 Gyn Blood Loss --recheck CBC, CMP, reticulocyte and iron panels today --can readminister feraheme 510mg  x 2 doses separated by 1 week if iron levels and Hgb remain low.  --patient is now s/p TAH performed by her OB/GYN in [place] their assistance in the management of the bleeding. --no clear indication for a GI referral at this time --RTC in 3 months to assess continued response to iron therapy  No orders of the defined types were placed in this encounter.  All questions were answered. The patient knows to call the clinic with any problems, questions or concerns.  A total of more than 30 minutes were spent on this encounter and over half of that time was spent on counseling and coordination of care as outlined above.   Ledell Peoples, MD Department of  Hematology/Oncology [place] at [facility] Phone: [phone] Pager: [phone] Email: [email or web address]  08/13/2019 4:34 PM

## 2019-08-14 ENCOUNTER — Inpatient Hospital Stay: Payer: Medicaid Other

## 2019-08-14 ENCOUNTER — Inpatient Hospital Stay: Payer: Medicaid Other | Attending: Internal Medicine | Admitting: Hematology and Oncology

## 2019-09-05 ENCOUNTER — Other Ambulatory Visit: Payer: Self-pay | Admitting: Hematology and Oncology

## 2019-09-05 DIAGNOSIS — D500 Iron deficiency anemia secondary to blood loss (chronic): Secondary | ICD-10-CM

## 2019-09-05 NOTE — Progress Notes (Signed)
[place] [phone]   [phone]  PROGRESS NOTE  Patient Care Team: [name], MD as PCP - General (Internal Medicine)  Hematological/Oncological History #Iron Deficiency Anemia Secondary to GYN Bleeding.  A)34 year old female referred for evaluation due to persistent iron deficiency anemia. Pt was seen at [facility] and was treated in the past with IV iron. Pt has an intolerance for oral iron. She has been told she has uterine fibroids. B) Feraheme infusionson 05/04/18, 06/01/18, and 11/09/18 C) 12/24/2018: establish care with Dr. Lorenso Courier  D) 01/04/2019: 1 dose of Feraheme 510mg  IV. Missed 2nd dose E) 02/25/2019: seen in clinic. Scheduled for an additional 2 doses of feraheme. Patient no showed to these visits. WBC 9.5, Hgb 9.5, MCV 79.9, Plt 451 F) 05/15/2019: patient requested urgent visit due to symptoms of anemia. WBC 8.9, Hgb 8.0, MCV 73.2, Plt 572 G) 07/02/2019: Total abdominal hysterectomy performed at [place]) 09/06/2019: WBC 5.4, Hgb 11.4, MCV 78.9, Plt 299  Interval History:  Elaine Perez 34 y.o. female with medical history significant for iron deficiency anemia who presents for a follow up visit. The patient's last visit was on 05/15/2019. In the interim since the last visit the patient had a hysterectomy at [facility].   On exam today Elaine Perez notes he still does feel fatigued, but is glad to have the surgery and have considerably less bleeding.  She notes that she has not had bleeding from any other sources including nosebleeds, dark stools, or bruising.  She notes that she recovered quite quickly from the surgery and has not had any issues with shortness of breath or chest pain.  She is taking iron pills twice daily and notes that it does block her up somewhat but she takes MiraLAX in order to help move her bowels.  She reports that she has not made any changes in her diet to increase  iron rich foods.  She denies having any fevers, chills, sweats, nausea, vomiting or diarrhea.  A full 10 point ROS is listed below.  MEDICAL HISTORY:  Past Medical History:  Diagnosis Date  . Blood transfusion without reported diagnosis 2019  . Diabetes mellitus without complication ([place])   . Fibroids   . Hypertension   . Iron deficiency anemia 04/27/2018  . Sleep apnea    Does not use CPAP    SURGICAL HISTORY: Past Surgical History:  Procedure Laterality Date  . GALLBLADDER SURGERY    . REPEAT CESAREAN SECTION      SOCIAL HISTORY: Social History   Socioeconomic History  . Marital status: Married    Spouse name: Not on file  . Number of children: 3  . Years of education: Not on file  . Highest education level: Not on file  Occupational History  . Not on file  Tobacco Use  . Smoking status: Never Smoker  . Smokeless tobacco: Never Used  Vaping Use  . Vaping Use: Never used  Substance and Sexual Activity  . Alcohol use: Never  . Drug use: Never  . Sexual activity: Not on file  Other Topics Concern  . Not on file  Social History Narrative  . Not on file   Social Determinants of Health   Financial Resource Strain:   . Difficulty of Paying Living Expenses:   Food Insecurity:   . Worried About Charity fundraiser in the Last Year:   . [place] in the Last Year:  Transportation Needs:   . Film/video editor (Medical):   Marland Kitchen Lack of Transportation (Non-Medical):   Physical Activity:   . Days of Exercise per Week:   . Minutes of Exercise per Session:   Stress:   . Feeling of Stress :   Social Connections:   . Frequency of Communication with Friends and Family:   . Frequency of Social Gatherings with Friends and Family:   . Attends Religious Services:   . Active Member of Clubs or Organizations:   . Attends Archivist Meetings:   Marland Kitchen Marital Status:   Intimate Partner Violence:   . Fear of Current or Ex-Partner:   . Emotionally Abused:   Marland Kitchen  Physically Abused:   . Sexually Abused:     FAMILY HISTORY: Family History  Problem Relation Age of Onset  . Breast cancer Other   . Heart disease Mother   . Lupus Maternal Aunt   . Stomach cancer Maternal Aunt   . Colon cancer Neg Hx   . Esophageal cancer Neg Hx   . Rectal cancer Neg Hx     ALLERGIES:  is allergic to morphine and related.  MEDICATIONS:  Current Outpatient Medications  Medication Sig Dispense Refill  . amLODipine (NORVASC) 2.5 MG tablet Take by mouth.    . losartan (COZAAR) 50 MG tablet Take by mouth.    . polyethylene glycol (MIRALAX / GLYCOLAX) 17 g packet Take 17 g by mouth daily.    . predniSONE (DELTASONE) 20 MG tablet Take 3 tablets on day 1-3, take 2 tablets on day 4-6, take 1 tablet on day 7-9    . albuterol (PROVENTIL) (2.5 MG/3ML) 0.083% nebulizer solution Inhale into the lungs.    Marland Kitchen albuterol (VENTOLIN HFA) 108 (90 Base) MCG/ACT inhaler Inhale 1-2 puffs into the lungs every 6 (six) hours as needed for wheezing or shortness of breath.     . diclofenac (VOLTAREN) 75 MG EC tablet Take 75 mg by mouth 2 (two) times daily as needed for mild pain or moderate pain.  (Patient not taking: Reported on 09/06/2019)    . escitalopram (LEXAPRO) 10 MG tablet Take 10 mg by mouth daily.    . famotidine (PEPCID) 20 MG tablet Take 20 mg by mouth daily.     . Ferrous Sulfate (IRON PO) Take 1 tablet by mouth daily.    . Fluticasone-Salmeterol (ADVAIR) 250-50 MCG/DOSE AEPB Inhale 1 puff into the lungs daily.     . hydrochlorothiazide (HYDRODIURIL) 25 MG tablet Take 25 mg by mouth daily.    Marland Kitchen HYDROcodone-acetaminophen (NORCO/VICODIN) 5-325 MG tablet Take 1 tablet by mouth every 6 (six) hours as needed for moderate pain or severe pain.  (Patient not taking: Reported on 09/06/2019)    . metFORMIN (GLUCOPHAGE) 500 MG tablet Take 500 mg by mouth 2 (two) times daily.    . methocarbamol (ROBAXIN) 500 MG tablet Take 1 tablet (500 mg total) by mouth 2 (two) times daily. (Patient not  taking: Reported on 09/06/2019) 20 tablet 0  . Multiple Vitamins-Minerals (HAIR SKIN AND NAILS FORMULA) TABS Take 1 tablet by mouth daily.    . pantoprazole (PROTONIX) 40 MG tablet Take 1 tablet (40 mg total) by mouth daily. 30 tablet 3   No current facility-administered medications for this visit.    REVIEW OF SYSTEMS:   Constitutional: ( - ) fevers, ( - )  chills , ( - ) night sweats Eyes: ( - ) blurriness of vision, ( - ) double vision, ( - )  watery eyes Ears, nose, mouth, throat, and face: ( - ) mucositis, ( - ) sore throat Respiratory: ( - ) cough, ( - ) dyspnea, ( - ) wheezes Cardiovascular: ( - ) palpitation, ( - ) chest discomfort, ( - ) lower extremity swelling Gastrointestinal:  ( - ) nausea, ( - ) heartburn, ( - ) change in bowel habits Skin: ( - ) abnormal skin rashes Lymphatics: ( - ) new lymphadenopathy, ( - ) easy bruising Neurological: ( - ) numbness, ( - ) tingling, ( - ) new weaknesses (-) headache Behavioral/Psych: ( - ) mood change, ( - ) new changes  All other systems were reviewed with the patient and are negative.  PHYSICAL EXAMINATION: ECOG PERFORMANCE STATUS: 1 - Symptomatic but completely ambulatory  Vitals:   09/06/19 1011 09/06/19 1013  BP: (!) 145/102 (!) 156/96  Pulse: 72   Resp: 18   Temp: (!) 97.5 F (36.4 C)   SpO2: 99%    Filed Weights   09/06/19 1011  Weight: (!) 247 lb 14.4 oz (112.4 kg)    GENERAL: well appearing obese African American female, alert, no distress and comfortable SKIN: skin color, texture, turgor are normal, no rashes or significant lesions EYES: conjunctiva are pale and non-injected, sclera clear LUNGS: clear to auscultation and percussion with normal breathing effort HEART: regular rate & rhythm and no murmurs and no lower extremity edema ABDOMEN: soft, non-tender, non-distended, normal bowel sounds Musculoskeletal: no cyanosis of digits and no clubbing  PSYCH: alert & oriented x 3, fluent speech NEURO: no focal  motor/sensory deficits  LABORATORY DATA:  I have reviewed the data as listed CBC Latest Ref Rng & Units 09/06/2019 05/29/2019 05/20/2019  WBC 4.0 - 10.5 K/uL 5.4 9.4 8.3  Hemoglobin 12.0 - 15.0 g/dL 11.4(L) 8.1(L) 7.3(L)  Hematocrit 36 - 46 % 38.9 30.0(L) 28.2(L)  Platelets 150 - 400 K/uL 299 404(H) 456(H)    CMP Latest Ref Rng & Units 09/06/2019 05/20/2019 05/15/2019  Glucose 70 - 99 mg/dL 162(H) 118(H) 168(H)  BUN 6 - 20 mg/dL 9 13 11   Creatinine 0.44 - 1.00 mg/dL 0.85 0.86 0.86  Sodium 135 - 145 mmol/L 142 141 139  Potassium 3.5 - 5.1 mmol/L 4.1 4.1 4.2  Chloride 98 - 111 mmol/L 108 112(H) 109  CO2 22 - 32 mmol/L 24 23 21(L)  Calcium 8.9 - 10.3 mg/dL 9.8 9.0 9.1  Total Protein 6.5 - 8.1 g/dL 7.8 - 7.8  Total Bilirubin 0.3 - 1.2 mg/dL <0.2(L) - <0.2(L)  Alkaline Phos 38 - 126 U/L 80 - 78  AST 15 - 41 U/L 21 - 13(L)  ALT 0 - 44 U/L 25 - 18    RADIOGRAPHIC STUDIES: I have personally reviewed the radiological images as listed and agreed with the findings in the report. No results found.  ASSESSMENT & PLAN Elaine Perez 34 y.o. female with medical history significant for iron deficiency anemia presents for a follow up visit.  On review of the labs and discussion with the patient her findings are still consistent with iron deficiency anemia secondary to heavy GYN blood loss.    In the interim since her last visit the patient underwent a total abdominal hysterectomy at [facility] on 07/02/2019.  She notes that she tolerated the surgery well and is not having any complications thereafter.  Her hemoglobin is steadily improving and increased to 11.4 today with increasing iron levels on p.o. iron therapy.  Given that the patient no longer has any  overt sources of bleeding I would recommend that she continue p.o. oral therapy and return to clinic in 3 months time to assure that her hemoglobin levels and iron levels are improving.  #Iron Deficiency Anemia 2/2 Gyn Blood Loss --recheck  CBC, CMP, reticulocyte and iron panels today --continue PO ferrous sulfate 325mg  daily with a source of vitamin C --patient reconnected with Ob/Gyn for heavy cycles and fibroid pain in [place] was performed on 07/02/2019. Appreciate their management of the bleeding. --RTC in 3 months to assess continued response to iron therapy  No orders of the defined types were placed in this encounter.  All questions were answered. The patient knows to call the clinic with any problems, questions or concerns.  A total of more than 20 minutes were spent on this encounter and over half of that time was spent on counseling and coordination of care as outlined above.   Ledell Peoples, MD Department of Hematology/Oncology [place] at [facility] Phone: [phone] Pager: [phone] Email: [email or web address]  09/06/2019 11:23 AM

## 2019-09-06 ENCOUNTER — Inpatient Hospital Stay (HOSPITAL_BASED_OUTPATIENT_CLINIC_OR_DEPARTMENT_OTHER): Payer: BLUE CROSS/BLUE SHIELD | Admitting: Hematology and Oncology

## 2019-09-06 ENCOUNTER — Inpatient Hospital Stay: Payer: BLUE CROSS/BLUE SHIELD | Attending: Internal Medicine

## 2019-09-06 ENCOUNTER — Encounter: Payer: Self-pay | Admitting: Hematology and Oncology

## 2019-09-06 ENCOUNTER — Other Ambulatory Visit: Payer: Self-pay

## 2019-09-06 VITALS — BP 156/96 | HR 72 | Temp 97.5°F | Resp 18 | Ht 61.0 in | Wt 247.9 lb

## 2019-09-06 DIAGNOSIS — N921 Excessive and frequent menstruation with irregular cycle: Secondary | ICD-10-CM | POA: Diagnosis not present

## 2019-09-06 DIAGNOSIS — Z90710 Acquired absence of both cervix and uterus: Secondary | ICD-10-CM | POA: Insufficient documentation

## 2019-09-06 DIAGNOSIS — D500 Iron deficiency anemia secondary to blood loss (chronic): Secondary | ICD-10-CM

## 2019-09-06 DIAGNOSIS — D649 Anemia, unspecified: Secondary | ICD-10-CM

## 2019-09-06 DIAGNOSIS — N920 Excessive and frequent menstruation with regular cycle: Secondary | ICD-10-CM | POA: Diagnosis present

## 2019-09-06 LAB — CBC WITH DIFFERENTIAL (CANCER CENTER ONLY)
Abs Immature Granulocytes: 0.03 10*3/uL (ref 0.00–0.07)
Basophils Absolute: 0 10*3/uL (ref 0.0–0.1)
Basophils Relative: 1 %
Eosinophils Absolute: 0.2 10*3/uL (ref 0.0–0.5)
Eosinophils Relative: 5 %
HCT: 38.9 % (ref 36.0–46.0)
Hemoglobin: 11.4 g/dL — ABNORMAL LOW (ref 12.0–15.0)
Immature Granulocytes: 1 %
Lymphocytes Relative: 41 %
Lymphs Abs: 2.3 10*3/uL (ref 0.7–4.0)
MCH: 23.1 pg — ABNORMAL LOW (ref 26.0–34.0)
MCHC: 29.3 g/dL — ABNORMAL LOW (ref 30.0–36.0)
MCV: 78.9 fL — ABNORMAL LOW (ref 80.0–100.0)
Monocytes Absolute: 0.5 10*3/uL (ref 0.1–1.0)
Monocytes Relative: 10 %
Neutro Abs: 2.3 10*3/uL (ref 1.7–7.7)
Neutrophils Relative %: 42 %
Platelet Count: 299 10*3/uL (ref 150–400)
RBC: 4.93 MIL/uL (ref 3.87–5.11)
RDW: 17.5 % — ABNORMAL HIGH (ref 11.5–15.5)
WBC Count: 5.4 10*3/uL (ref 4.0–10.5)
nRBC: 0 % (ref 0.0–0.2)

## 2019-09-06 LAB — IRON AND TIBC
Iron: 32 ug/dL — ABNORMAL LOW (ref 41–142)
Saturation Ratios: 9 % — ABNORMAL LOW (ref 21–57)
TIBC: 375 ug/dL (ref 236–444)
UIBC: 343 ug/dL (ref 120–384)

## 2019-09-06 LAB — RETIC PANEL
Immature Retic Fract: 20.1 % — ABNORMAL HIGH (ref 2.3–15.9)
RBC.: 4.99 MIL/uL (ref 3.87–5.11)
Retic Count, Absolute: 40.9 10*3/uL (ref 19.0–186.0)
Retic Ct Pct: 0.8 % (ref 0.4–3.1)
Reticulocyte Hemoglobin: 30.4 pg (ref >27.9)

## 2019-09-06 LAB — CMP (CANCER CENTER ONLY)
ALT: 25 U/L (ref 0–44)
AST: 21 U/L (ref 15–41)
Albumin: 3.5 g/dL (ref 3.5–5.0)
Alkaline Phosphatase: 80 U/L (ref 38–126)
Anion gap: 10 (ref 5–15)
BUN: 9 mg/dL (ref 6–20)
CO2: 24 mmol/L (ref 22–32)
Calcium: 9.8 mg/dL (ref 8.9–10.3)
Chloride: 108 mmol/L (ref 98–111)
Creatinine: 0.85 mg/dL (ref 0.44–1.00)
GFR, Est AFR Am: >60 mL/min (ref >60)
GFR, Estimated: >60 mL/min (ref >60)
Glucose, Bld: 162 mg/dL — ABNORMAL HIGH (ref 70–99)
Potassium: 4.1 mmol/L (ref 3.5–5.1)
Sodium: 142 mmol/L (ref 135–145)
Total Bilirubin: <0.2 mg/dL — ABNORMAL LOW (ref 0.3–1.2)
Total Protein: 7.8 g/dL (ref 6.5–8.1)

## 2019-09-06 LAB — FERRITIN: Ferritin: 10 ng/mL — ABNORMAL LOW (ref 11–307)

## 2019-09-11 ENCOUNTER — Other Ambulatory Visit: Payer: Self-pay

## 2019-09-11 ENCOUNTER — Encounter (HOSPITAL_COMMUNITY): Payer: Self-pay | Admitting: Emergency Medicine

## 2019-09-11 ENCOUNTER — Emergency Department (HOSPITAL_COMMUNITY)
Admission: EM | Admit: 2019-09-11 | Discharge: 2019-09-11 | Disposition: A | Discharge status: Home / Self Care | Payer: BLUE CROSS/BLUE SHIELD | Attending: Emergency Medicine | Admitting: Emergency Medicine

## 2019-09-11 DIAGNOSIS — I10 Essential (primary) hypertension: Secondary | ICD-10-CM | POA: Diagnosis not present

## 2019-09-11 DIAGNOSIS — R1030 Lower abdominal pain, unspecified: Secondary | ICD-10-CM

## 2019-09-11 DIAGNOSIS — E119 Type 2 diabetes mellitus without complications: Secondary | ICD-10-CM | POA: Insufficient documentation

## 2019-09-11 DIAGNOSIS — Z7984 Long term (current) use of oral hypoglycemic drugs: Secondary | ICD-10-CM | POA: Diagnosis not present

## 2019-09-11 LAB — URINALYSIS, ROUTINE W REFLEX MICROSCOPIC
Bilirubin Urine: NEGATIVE
Glucose, UA: NEGATIVE mg/dL
Hgb urine dipstick: NEGATIVE
Ketones, ur: NEGATIVE mg/dL
Leukocytes,Ua: NEGATIVE
Nitrite: NEGATIVE
Protein, ur: NEGATIVE mg/dL
Specific Gravity, Urine: 1.027 (ref 1.005–1.030)
pH: 5 (ref 5.0–8.0)

## 2019-09-11 LAB — COMPREHENSIVE METABOLIC PANEL
ALT: 29 U/L (ref 0–44)
AST: 23 U/L (ref 15–41)
Albumin: 3.7 g/dL (ref 3.5–5.0)
Alkaline Phosphatase: 63 U/L (ref 38–126)
Anion gap: 8 (ref 5–15)
BUN: 13 mg/dL (ref 6–20)
CO2: 24 mmol/L (ref 22–32)
Calcium: 9 mg/dL (ref 8.9–10.3)
Chloride: 106 mmol/L (ref 98–111)
Creatinine, Ser: 0.84 mg/dL (ref 0.44–1.00)
GFR calc Af Amer: >60 mL/min (ref >60)
GFR calc non Af Amer: >60 mL/min (ref >60)
Glucose, Bld: 200 mg/dL — ABNORMAL HIGH (ref 70–99)
Potassium: 3.9 mmol/L (ref 3.5–5.1)
Sodium: 138 mmol/L (ref 135–145)
Total Bilirubin: 0.2 mg/dL — ABNORMAL LOW (ref 0.3–1.2)
Total Protein: 7.5 g/dL (ref 6.5–8.1)

## 2019-09-11 LAB — CBC WITH DIFFERENTIAL/PLATELET
Abs Immature Granulocytes: 0.02 10*3/uL (ref 0.00–0.07)
Basophils Absolute: 0 10*3/uL (ref 0.0–0.1)
Basophils Relative: 1 %
Eosinophils Absolute: 0.2 10*3/uL (ref 0.0–0.5)
Eosinophils Relative: 3 %
HCT: 39.2 % (ref 36.0–46.0)
Hemoglobin: 11.4 g/dL — ABNORMAL LOW (ref 12.0–15.0)
Immature Granulocytes: 0 %
Lymphocytes Relative: 41 %
Lymphs Abs: 3.1 10*3/uL (ref 0.7–4.0)
MCH: 23.7 pg — ABNORMAL LOW (ref 26.0–34.0)
MCHC: 29.1 g/dL — ABNORMAL LOW (ref 30.0–36.0)
MCV: 81.3 fL (ref 80.0–100.0)
Monocytes Absolute: 0.7 10*3/uL (ref 0.1–1.0)
Monocytes Relative: 9 %
Neutro Abs: 3.6 10*3/uL (ref 1.7–7.7)
Neutrophils Relative %: 46 %
Platelets: 334 10*3/uL (ref 150–400)
RBC: 4.82 MIL/uL (ref 3.87–5.11)
RDW: 17.3 % — ABNORMAL HIGH (ref 11.5–15.5)
WBC: 7.6 10*3/uL (ref 4.0–10.5)
nRBC: 0 % (ref 0.0–0.2)

## 2019-09-11 LAB — LIPASE, BLOOD: Lipase: 32 U/L (ref 11–51)

## 2019-09-11 MED ORDER — SODIUM CHLORIDE 0.9% FLUSH: 3.0000 mL | Freq: Once | INTRAVENOUS | Status: DC

## 2019-09-11 MED ORDER — FENTANYL CITRATE (PF) 100 MCG/2ML IJ SOLN
50.0000 ug | Freq: Once | INTRAMUSCULAR | Status: AC
  Administered 2019-09-11: 50 ug via INTRAVENOUS
  Filled 2019-09-11: qty 2

## 2019-09-11 NOTE — ED Provider Notes (Signed)
[facility] DEPT Provider Note   CSN: [id number] Arrival date & time: 09/11/19  0453     History Chief Complaint  Patient presents with  . Abdominal Pain    Elaine Perez is a 34 y.o. female resenting for evaluation of lower abdominal pain.  Patient states 2 months ago she had a hysterectomy due to fibroids and a strong family history of cancer.  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  She developed gradually worsening lower abdominal pain throughout the course of the evening.  She took 2 Aleve, which improved her symptoms, but did not resolve her pain.  She denies fevers, chills, cough, chest pain, shortness of breath, nausea, vomiting, urinary symptoms, abnormal bowel movements.  She currently is not prescribed any pain medication.  She reports a history of anemia, is receiving iron transfusions.  She has a history of diabetes and hypertension.  HPI     Past Medical History:  Diagnosis Date  . Blood transfusion without reported diagnosis 2019  . Diabetes mellitus without complication ([place])   . Fibroids   . Hypertension   . Iron deficiency anemia 04/27/2018  . Sleep apnea    Does not use CPAP    Patient Active Problem List   Diagnosis Date Noted  . Iron deficiency anemia due to chronic blood loss 08/31/2018  . Iron deficiency anemia 04/27/2018    Past Surgical History:  Procedure Laterality Date  . GALLBLADDER SURGERY    . REPEAT CESAREAN SECTION       OB History   No obstetric history on file.     Family History  Problem Relation Age of Onset  . Breast cancer Other   . Heart disease Mother   . Lupus Maternal Aunt   . Stomach cancer Maternal Aunt   . Colon cancer Neg Hx   . Esophageal cancer Neg Hx   . Rectal cancer Neg Hx     Social History   Tobacco Use  . Smoking status: Never Smoker  . Smokeless tobacco: Never Used  Vaping Use  . Vaping  Use: Never used  Substance Use Topics  . Alcohol use: Never  . Drug use: Never    Home Medications Prior to Admission medications   Medication Sig Start Date End Date Taking? Authorizing Provider  albuterol (PROVENTIL) (2.5 MG/3ML) 0.083% nebulizer solution Inhale 2.5 mg into the lungs every 6 (six) hours as needed for wheezing or shortness of breath.    Yes [provider]  albuterol (VENTOLIN HFA) 108 (90 Base) MCG/ACT inhaler Inhale 1-2 puffs into the lungs every 6 (six) hours as needed for wheezing or shortness of breath.  06/15/10  Yes [provider]  famotidine (PEPCID) 20 MG tablet Take 20 mg by mouth daily.  03/12/19  Yes [provider]  Fluticasone-Salmeterol (ADVAIR) 250-50 MCG/DOSE AEPB Inhale 1 puff into the lungs daily.  05/23/18  Yes [provider]  hydrochlorothiazide (HYDRODIURIL) 25 MG tablet Take 25 mg by mouth daily. 03/12/19  Yes [provider]  losartan (COZAAR) 50 MG tablet Take 50 mg by mouth daily.  08/02/19 08/01/20 Yes [provider]  metFORMIN (GLUCOPHAGE) 500 MG tablet Take 500 mg by mouth 2 (two) times daily. 04/01/19  Yes [provider]  naproxen sodium (ALEVE) 220 MG tablet Take 440 mg by mouth 2 (two) times daily as needed (pain.headache).   Yes [provider]  polyethylene glycol (MIRALAX / GLYCOLAX) 17 g packet Take 17 g by mouth daily.   Yes [provider]  methocarbamol (ROBAXIN) 500 MG tablet Take 1 tablet (500 mg total) by mouth 2 (two) times daily. Patient not taking: Reported on 09/06/2019 03/05/19   Providence Lanius A, PA-C  pantoprazole (PROTONIX) 40 MG tablet Take 1 tablet (40 mg total) by mouth daily. Patient not taking: Reported on 09/11/2019 10/04/18   Thornton Park, MD    Allergies    Morphine and related  Review of Systems   Review of Systems  Gastrointestinal: Positive for abdominal pain.  All other systems reviewed and are negative.   Physical Exam Updated  Vital Signs BP (!) 136/97   Pulse 81   Temp 98.4 F (36.9 C) (Oral)   Resp 15   Ht 5\' 1"  (1.549 m)   Wt (!) 104.3 kg   LMP 05/05/2019 (Approximate)   SpO2 99%   BMI 43.46 kg/m   Physical Exam Vitals and nursing note reviewed.  Constitutional:      General: She is not in acute distress.    Appearance: She is well-developed. She is obese.     Comments: Resting in the bed in no acute distress  HENT:     Head: Normocephalic and atraumatic.  Eyes:     Conjunctiva/sclera: Conjunctivae normal.     Pupils: Pupils are equal, round, and reactive to light.  Cardiovascular:     Rate and Rhythm: Normal rate and regular rhythm.  Pulmonary:     Effort: Pulmonary effort is normal. No respiratory distress.     Breath sounds: Normal breath sounds. No wheezing.  Abdominal:     General: There is no distension.     Palpations: Abdomen is soft. There is no mass.     Tenderness: There is no abdominal tenderness. There is no guarding or rebound.     Comments: No significant tenderness palpation of the abdomen, however exam is limited due to body habitus.  No rigidity, guarding, distention.  Negative rebound. No peritonitis  Musculoskeletal:        General: Normal range of motion.     Cervical back: Normal range of motion and neck supple.  Skin:    General: Skin is warm and dry.     Capillary Refill: Capillary refill takes less than 2 seconds.  Neurological:     Mental Status: She is alert and oriented to person, place, and time.     ED Results / Procedures / Treatments   Labs (all labs ordered are listed, but only abnormal results are displayed) Labs Reviewed  COMPREHENSIVE METABOLIC PANEL - Abnormal; Notable for the following components:      Result Value   Glucose, Bld 200 (*)    Total Bilirubin 0.2 (*)    All other components within normal limits  CBC WITH DIFFERENTIAL/PLATELET - Abnormal; Notable for the following components:   Hemoglobin 11.4 (*)    MCH 23.7 (*)    MCHC 29.1 (*)     RDW 17.3 (*)    All other components within normal limits  LIPASE, BLOOD  URINALYSIS, ROUTINE W REFLEX MICROSCOPIC    EKG None  Radiology No results found.  Procedures Procedures (including critical care time)  Medications Ordered in ED Medications  fentaNYL (SUBLIMAZE) injection 50 mcg (50 mcg Intravenous Given 09/11/19 0745)    ED Course  I have reviewed the triage vital signs and the nursing notes.  Pertinent labs & imaging results that were available  during my care of the patient were reviewed by me and considered in my medical decision making (see chart for details).    MDM Rules/Calculators/A&P                          Patient presenting for evaluation of lower abdominal pain which began gradually last night while she was doing heavy lifting.  On exam, patient appears nontoxic.  No focal abdominal pain.  Due to the correlation of pain with physical, likely soreness.  It was acute onset, she does not continue to have severe pain.  As such, low suspicion for rupture or bleed.  Likely soreness from surgery.  Will obtain labs to ensure no abnormality, treat pain, and reassess.  Labs overall reassuring.  No leukocytosis.  Electrolytes stable.  Hemoglobin stable.  Urine without infection.  On reassessment, patient reports pain is much improved.  Discussed likely cause of pain.  I do not believe she needs emergent CT at this time, patient is agreeable.  Discussed continued symptomatic treatment, and follow-up with PCP as needed.  At this time, patient appears safe for discharge.  Return precautions given.  Patient states she understands and agrees to plan.   Final Clinical Impression(s) / ED Diagnoses Final diagnoses:  Lower abdominal pain    Rx / DC Orders ED Discharge Orders    None       Franchot Heidelberg, PA-C 09/11/19 0857    Davonna Belling, MD 09/11/19 1453

## 2019-09-11 NOTE — ED Notes (Signed)
unable to obtain lab. Pt currently trying to provide urine specimen.

## 2019-09-11 NOTE — ED Triage Notes (Signed)
Patient is complaining of lower abdominal pain. Patient states that the pain started around midnight.

## 2019-09-11 NOTE — Discharge Instructions (Signed)
Continue taking home medications as prescribed. Use Tylenol or ibuprofen as needed for pain. Use heating pad as needed for pain control. Follow-up with your primary care doctor as needed for further work restrictions. Return to the emergency room with any new, sudden, concerning symptoms.

## 2019-09-13 ENCOUNTER — Telehealth: Payer: Self-pay | Admitting: *Deleted

## 2019-09-13 NOTE — Telephone Encounter (Signed)
-----   Message from Orson Slick, MD sent at 09/11/2019  5:20 PM EDT ----- Please let Elaine Perez know that her iron levels are improving. We will plan to see her back in 3 months time. Assure that she is taking her iron pills as prescribed. ----- Message ----- From: Buel Ream, Lab [facility] [place] Sent: 09/06/2019  10:09 AM EDT To: Orson Slick, MD

## 2019-09-13 NOTE — Telephone Encounter (Signed)
Attempted call to pt. No answer but was able to leave message on identified phone #. Advised pt continue to take her iron tablets as her iron levels are improving. We will see her again in 3 months.

## 2019-09-16 ENCOUNTER — Telehealth: Payer: Self-pay | Admitting: Hematology and Oncology

## 2019-09-16 NOTE — Telephone Encounter (Signed)
Scheduled per los. Called and spoke with patient. Confirmed appt 

## 2019-10-15 ENCOUNTER — Other Ambulatory Visit: Payer: Self-pay

## 2019-10-15 ENCOUNTER — Emergency Department (HOSPITAL_COMMUNITY): Payer: BLUE CROSS/BLUE SHIELD

## 2019-10-15 ENCOUNTER — Encounter (HOSPITAL_COMMUNITY): Payer: Self-pay

## 2019-10-15 ENCOUNTER — Emergency Department (HOSPITAL_COMMUNITY)
Admission: EM | Admit: 2019-10-15 | Discharge: 2019-10-15 | Disposition: A | Discharge status: Home / Self Care | Payer: BLUE CROSS/BLUE SHIELD | Attending: Emergency Medicine | Admitting: Emergency Medicine

## 2019-10-15 DIAGNOSIS — I10 Essential (primary) hypertension: Secondary | ICD-10-CM | POA: Insufficient documentation

## 2019-10-15 DIAGNOSIS — Z7984 Long term (current) use of oral hypoglycemic drugs: Secondary | ICD-10-CM | POA: Diagnosis not present

## 2019-10-15 DIAGNOSIS — E119 Type 2 diabetes mellitus without complications: Secondary | ICD-10-CM | POA: Diagnosis not present

## 2019-10-15 DIAGNOSIS — Z79899 Other long term (current) drug therapy: Secondary | ICD-10-CM | POA: Diagnosis not present

## 2019-10-15 DIAGNOSIS — J069 Acute upper respiratory infection, unspecified: Secondary | ICD-10-CM | POA: Diagnosis not present

## 2019-10-15 DIAGNOSIS — J45909 Unspecified asthma, uncomplicated: Secondary | ICD-10-CM | POA: Diagnosis not present

## 2019-10-15 DIAGNOSIS — Z20822 Contact with and (suspected) exposure to covid-19: Secondary | ICD-10-CM | POA: Insufficient documentation

## 2019-10-15 DIAGNOSIS — J029 Acute pharyngitis, unspecified: Secondary | ICD-10-CM | POA: Diagnosis present

## 2019-10-15 HISTORY — DX: Unspecified asthma, uncomplicated: J45.909

## 2019-10-15 LAB — SARS CORONAVIRUS 2 BY RT PCR (HOSPITAL ORDER, PERFORMED IN ~~LOC~~ HOSPITAL LAB): SARS Coronavirus 2: NEGATIVE

## 2019-10-15 MED ORDER — ACETAMINOPHEN 325 MG PO TABS
650.0000 mg | ORAL_TABLET | Freq: Once | ORAL | Status: AC
  Administered 2019-10-15: 650 mg via ORAL
  Filled 2019-10-15: qty 2

## 2019-10-15 MED ORDER — ALBUTEROL SULFATE HFA 108 (90 BASE) MCG/ACT IN AERS
2.0000 | INHALATION_SPRAY | Freq: Once | RESPIRATORY_TRACT | Status: AC
  Administered 2019-10-15: 2 via RESPIRATORY_TRACT
  Filled 2019-10-15: qty 6.7

## 2019-10-15 MED ORDER — IPRATROPIUM BROMIDE HFA 17 MCG/ACT IN AERS
2.0000 | INHALATION_SPRAY | Freq: Once | RESPIRATORY_TRACT | Status: AC
  Administered 2019-10-15: 2 via RESPIRATORY_TRACT
  Filled 2019-10-15: qty 12.9

## 2019-10-15 MED ORDER — ONDANSETRON 4 MG PO TBDP
4.0000 mg | ORAL_TABLET | Freq: Once | ORAL | Status: AC
  Administered 2019-10-15: 4 mg via ORAL
  Filled 2019-10-15: qty 1

## 2019-10-15 NOTE — ED Notes (Signed)
Patient was on her cell phone the whole time I was trying to collect vitals

## 2019-10-15 NOTE — ED Triage Notes (Signed)
Patient arrived with complaints of headache, sore throat and chills that started Friday. Reports taking tylenol and ibuprofen alternating with some relief. States she has asthma and has been using her inhaler more than normal.

## 2019-10-15 NOTE — Discharge Instructions (Signed)
Your work-up was reassuring today.  Your Covid test was negative is make sure to stay hydrated.  Get plenty of rest.  Please use the attached instructions.  If you have any new or worsening concerning symptoms please come back to the emergency department.  As we discussed your blood pressure was elevated today, please take your blood pressure medication when you get home and follow-up with your primary care in the next couple of days.  If you have any chest pain, or difficulty breathing please come back to the emergency department.  Please take your asthma medications as we discussed.  After you feel better I would recommend getting your Covid vaccine.

## 2019-10-15 NOTE — ED Provider Notes (Signed)
[facility] DEPT Provider Note   CSN: [id number] Arrival date & time: 10/15/19  5170     History Chief Complaint  Patient presents with  . Sore Throat  . Headache    Elaine Perez is a 34 y.o. female with past medical history of asthma, hypertension, diabetes who presents emergency department today for URI-like symptoms that started on Friday.  Patient states that she is having myalgias, headache, sore throat, chills, fever, nausea, vomiting, diarrhea.  States that she vomited last 2 days ago, has been able to keep down fluids.  States that she has been drinking plenty of water which she has been able to keep down.  Also states that she has been having to use her inhaler more, feels like she has had her asthma more this week.  States that she has myalgias all over her body, mainly on her upper half of her body.  States that she has been alternating between ibuprofen and Tylenol which has been providing some relief.  States that her husband also feels sick, they have not been vaccinated for Covid.  Denies any dizziness, numbness, tingling, weakness, confusion, neck pain, back pain.  States that she does feel short of breath when she coughs, no other times for shortness of breath.  Has not taken her hypertension medications this morning.  No rash, vision changes.  HPI     Past Medical History:  Diagnosis Date  . Asthma   . Blood transfusion without reported diagnosis 2019  . Diabetes mellitus without complication ([place])   . Fibroids   . Hypertension   . Iron deficiency anemia 04/27/2018  . Sleep apnea    Does not use CPAP    Patient Active Problem List   Diagnosis Date Noted  . Iron deficiency anemia due to chronic blood loss 08/31/2018  . Iron deficiency anemia 04/27/2018    Past Surgical History:  Procedure Laterality Date  . GALLBLADDER SURGERY    . REPEAT CESAREAN SECTION       OB History   No obstetric history on file.     Family  History  Problem Relation Age of Onset  . Breast cancer Other   . Heart disease Mother   . Lupus Maternal Aunt   . Stomach cancer Maternal Aunt   . Colon cancer Neg Hx   . Esophageal cancer Neg Hx   . Rectal cancer Neg Hx     Social History   Tobacco Use  . Smoking status: Never Smoker  . Smokeless tobacco: Never Used  Vaping Use  . Vaping Use: Never used  Substance Use Topics  . Alcohol use: Never  . Drug use: Never    Home Medications Prior to Admission medications   Medication Sig Start Date End Date Taking? Authorizing Provider  albuterol (PROVENTIL) (2.5 MG/3ML) 0.083% nebulizer solution Inhale 2.5 mg into the lungs every 6 (six) hours as needed for wheezing or shortness of breath.     [provider]  albuterol (VENTOLIN HFA) 108 (90 Base) MCG/ACT inhaler Inhale 1-2 puffs into the lungs every 6 (six) hours as needed for wheezing or shortness of breath.  06/15/10   [provider]  famotidine (PEPCID) 20 MG tablet Take 20 mg by mouth daily.  03/12/19   [provider]  Fluticasone-Salmeterol (ADVAIR) 250-50 MCG/DOSE AEPB Inhale 1 puff into the lungs daily.  05/23/18   [provider]  hydrochlorothiazide (HYDRODIURIL) 25 MG tablet Take 25 mg by mouth daily. 03/12/19  [provider]  losartan (COZAAR) 50 MG tablet Take 50 mg by mouth daily.  08/02/19 08/01/20  [provider]  metFORMIN (GLUCOPHAGE) 500 MG tablet Take 500 mg by mouth 2 (two) times daily. 04/01/19   [provider]  methocarbamol (ROBAXIN) 500 MG tablet Take 1 tablet (500 mg total) by mouth 2 (two) times daily. Patient not taking: Reported on 09/06/2019 03/05/19   Providence Lanius A, PA-C  naproxen sodium (ALEVE) 220 MG tablet Take 440 mg by mouth 2 (two) times daily as needed (pain.headache).    [provider]  pantoprazole (PROTONIX) 40 MG tablet Take 1 tablet (40 mg total) by mouth daily. Patient not taking: Reported on 09/11/2019 10/04/18    Thornton Park, MD  polyethylene glycol (MIRALAX / GLYCOLAX) 17 g packet Take 17 g by mouth daily.    [provider]    Allergies    Morphine and related  Review of Systems   Review of Systems  Constitutional: Negative for chills, diaphoresis, fatigue and fever.  HENT: Positive for congestion and sore throat. Negative for ear discharge, ear pain, facial swelling, postnasal drip, sinus pressure, sinus pain, sneezing, trouble swallowing and voice change.   Eyes: Negative for pain and visual disturbance.  Respiratory: Negative for cough, shortness of breath and wheezing.   Cardiovascular: Negative for chest pain, palpitations and leg swelling.  Gastrointestinal: Negative for abdominal distention, abdominal pain, diarrhea, nausea and vomiting.  Genitourinary: Negative for difficulty urinating.  Musculoskeletal: Positive for arthralgias and myalgias. Negative for back pain, neck pain and neck stiffness.  Skin: Negative for pallor.  Neurological: Negative for dizziness, speech difficulty, weakness and headaches.  Psychiatric/Behavioral: Negative for confusion.    Physical Exam Updated Vital Signs BP (!) 168/110   Pulse 82   Temp 98.8 F (37.1 C) (Oral)   Resp 20   Ht 5\' 1"  (1.549 m)   Wt 103.9 kg   LMP 05/05/2019 (Approximate)   SpO2 100%   BMI 43.27 kg/m   Physical Exam Constitutional:      General: She is not in acute distress.    Appearance: Normal appearance. She is not ill-appearing, toxic-appearing or diaphoretic.     Comments: Patient without acute respiratory stress.  Patient is sitting comfortably in bed, no tripoding, use of accessory muscles.  Patient is speaking to me in full sentences.  Handling secretions well.  HENT:     Head: Normocephalic and atraumatic.     Jaw: There is normal jaw occlusion. No trismus, swelling or malocclusion.     Nose: No congestion or rhinorrhea.     Right Sinus: No maxillary sinus tenderness or frontal sinus tenderness.      Left Sinus: No maxillary sinus tenderness or frontal sinus tenderness.     Mouth/Throat:     Mouth: Mucous membranes are moist. No oral lesions.     Dentition: Normal dentition.     Tongue: No lesions.     Palate: No mass and lesions.     Pharynx: Oropharynx is clear. Uvula midline. No pharyngeal swelling, oropharyngeal exudate, posterior oropharyngeal erythema or uvula swelling.     Tonsils: No tonsillar exudate or tonsillar abscesses. 1+ on the right. 1+ on the left.     Comments: Patient without tonsillar enlargement or exudate.  No signs of peritonsillar abscess, palate without any tenderness or masses palpated.  No swelling under the tongue, uvula is midline without any inflammation. Eyes:     General: No visual field deficit.  Right eye: No discharge.        Left eye: No discharge.     Extraocular Movements: Extraocular movements intact.     Conjunctiva/sclera: Conjunctivae normal.     Pupils: Pupils are equal, round, and reactive to light.  Cardiovascular:     Rate and Rhythm: Normal rate and regular rhythm.     Pulses: Normal pulses.     Heart sounds: Normal heart sounds. No murmur heard.  No friction rub. No gallop.   Pulmonary:     Effort: Pulmonary effort is normal. No respiratory distress.     Breath sounds: Normal breath sounds. No stridor. No wheezing, rhonchi or rales.  Chest:     Chest wall: No tenderness.  Abdominal:     General: Abdomen is flat. Bowel sounds are normal. There is no distension.     Palpations: Abdomen is soft.     Tenderness: There is no abdominal tenderness. There is no right CVA tenderness or left CVA tenderness.  Musculoskeletal:        General: No swelling or tenderness. Normal range of motion.     Cervical back: Normal range of motion. No rigidity or tenderness.     Right lower leg: No edema.     Left lower leg: No edema.  Lymphadenopathy:     Cervical: No cervical adenopathy.  Skin:    General: Skin is warm and dry.     Capillary  Refill: Capillary refill takes less than 2 seconds.     Findings: No erythema or rash.  Neurological:     General: No focal deficit present.     Mental Status: She is alert and oriented to person, place, and time.     Cranial Nerves: Cranial nerves are intact. No cranial nerve deficit or facial asymmetry.     Motor: Motor function is intact. No weakness.     Coordination: Coordination is intact.     Gait: Gait is intact. Gait normal.  Psychiatric:        Mood and Affect: Mood normal.     ED Results / Procedures / Treatments   Labs (all labs ordered are listed, but only abnormal results are displayed) Labs Reviewed  SARS CORONAVIRUS 2 BY RT PCR ([place], [facility])    EKG None  Radiology DG Chest Port 1 View  Result Date: 10/15/2019 CLINICAL DATA:  Shortness of breath EXAM: PORTABLE CHEST 1 VIEW COMPARISON:  09/12/2017 chest radiograph. FINDINGS: Clear lungs. No pneumothorax or pleural effusion. Cardiomediastinal silhouette is unremarkable. No acute osseous abnormality. IMPRESSION: No focal airspace disease. Electronically Signed   By: Primitivo Gauze M.D.   On: 10/15/2019 12:34    Procedures Procedures (including critical care time)  Medications Ordered in ED Medications  ondansetron (ZOFRAN-ODT) disintegrating tablet 4 mg (4 mg Oral Given 10/15/19 1248)  acetaminophen (TYLENOL) tablet 650 mg (650 mg Oral Given 10/15/19 1248)  albuterol (VENTOLIN HFA) 108 (90 Base) MCG/ACT inhaler 2 puff (2 puffs Inhalation Given 10/15/19 1247)  ipratropium (ATROVENT HFA) inhaler 2 puff (2 puffs Inhalation Given 10/15/19 1247)    ED Course  I have reviewed the triage vital signs and the nursing notes.  Pertinent labs & imaging results that were available during my care of the patient were reviewed by me and considered in my medical decision making (see chart for details).    MDM Rules/Calculators/A&P  Arretta Toenjes is a 34  y.o. female with past medical history of asthma, hypertension, diabetes who presents emerge department today for URI-like symptoms that started on Friday.  Patient with symptoms concerning for Covid, patient appears well otherwise.  Will obtain Covid swab, chest x-ray, do not think that patient needs blood work at this time.  Patient is well-appearing, no respiratory distress. Lungs clear. Vital stable, no hypoxia or tachypnea.  Blood pressure elevated most likely due to patient not taking blood pressure medication, did discuss this with patient.  Patient states that she will take blood pressure medication and follow-up with PCP about this.  Chest x-ray interpreted by me without any cardiopulmonary disease.  EKG without arrhythmia or ischemia.  Covid test negative. Upon reassessment patient states that she feels slightly better with inhalers and Tylenol on board.  Still is having myalgias.  O2 sat 100% and respirations 18.  Patient states that she is ready to go home, I think that patient most likely has upper respiratory infection.  Tickborne illness less likely since patient has cough and sore throat.  No rash or fever.  Did give strict return precautions for this.  Patient follow-up with PCP in the next couple of days, patient does have PCP and is agreeable.  Patient passed p.o. challenge.  Has not vomited for 7 hours while she is been here.  Patient to be discharged.  Patient did not want antiemetic prescription.  Doubt need for further emergent work up at this time. I explained the diagnosis and have given explicit precautions to return to the ER including for any other new or worsening symptoms. The patient understands and accepts the medical plan as it's been dictated and I have answered their questions. Discharge instructions concerning home care and prescriptions have been given. The patient is STABLE and is discharged to home in good condition.   Final Clinical Impression(s) / ED Diagnoses Final  diagnoses:  Viral upper respiratory tract infection    Rx / DC Orders ED Discharge Orders    None       Elaine Client, PA-C 10/15/19 1350    Lacretia Leigh, MD 10/16/19 1217

## 2019-12-12 ENCOUNTER — Other Ambulatory Visit: Payer: Self-pay | Admitting: Hematology and Oncology

## 2019-12-12 ENCOUNTER — Other Ambulatory Visit: Payer: Medicaid Other

## 2019-12-12 ENCOUNTER — Ambulatory Visit: Payer: Medicaid Other | Admitting: Hematology and Oncology

## 2019-12-12 DIAGNOSIS — D500 Iron deficiency anemia secondary to blood loss (chronic): Secondary | ICD-10-CM

## 2020-02-21 ENCOUNTER — Emergency Department (HOSPITAL_COMMUNITY)
Admission: EM | Admit: 2020-02-21 | Discharge: 2020-02-21 | Disposition: A | Discharge status: Home / Self Care | Payer: Medicaid Other | Attending: Emergency Medicine | Admitting: Emergency Medicine

## 2020-02-21 ENCOUNTER — Emergency Department (HOSPITAL_COMMUNITY): Payer: Medicaid Other

## 2020-02-21 ENCOUNTER — Encounter (HOSPITAL_COMMUNITY): Payer: Self-pay | Admitting: *Deleted

## 2020-02-21 DIAGNOSIS — Z79899 Other long term (current) drug therapy: Secondary | ICD-10-CM | POA: Diagnosis not present

## 2020-02-21 DIAGNOSIS — Z7984 Long term (current) use of oral hypoglycemic drugs: Secondary | ICD-10-CM | POA: Diagnosis not present

## 2020-02-21 DIAGNOSIS — R6883 Chills (without fever): Secondary | ICD-10-CM | POA: Diagnosis present

## 2020-02-21 DIAGNOSIS — U071 COVID-19: Secondary | ICD-10-CM | POA: Diagnosis not present

## 2020-02-21 DIAGNOSIS — J45909 Unspecified asthma, uncomplicated: Secondary | ICD-10-CM | POA: Diagnosis not present

## 2020-02-21 DIAGNOSIS — E860 Dehydration: Secondary | ICD-10-CM | POA: Diagnosis not present

## 2020-02-21 DIAGNOSIS — I10 Essential (primary) hypertension: Secondary | ICD-10-CM | POA: Insufficient documentation

## 2020-02-21 DIAGNOSIS — E119 Type 2 diabetes mellitus without complications: Secondary | ICD-10-CM | POA: Insufficient documentation

## 2020-02-21 LAB — CBC WITH DIFFERENTIAL/PLATELET
Abs Immature Granulocytes: 0.02 10*3/uL (ref 0.00–0.07)
Basophils Absolute: 0 10*3/uL (ref 0.0–0.1)
Basophils Relative: 0 %
Eosinophils Absolute: 0 10*3/uL (ref 0.0–0.5)
Eosinophils Relative: 0 %
HCT: 46.6 % — ABNORMAL HIGH (ref 36.0–46.0)
Hemoglobin: 15.1 g/dL — ABNORMAL HIGH (ref 12.0–15.0)
Immature Granulocytes: 0 %
Lymphocytes Relative: 26 %
Lymphs Abs: 1.2 10*3/uL (ref 0.7–4.0)
MCH: 28.2 pg (ref 26.0–34.0)
MCHC: 32.4 g/dL (ref 30.0–36.0)
MCV: 87.1 fL (ref 80.0–100.0)
Monocytes Absolute: 0.3 10*3/uL (ref 0.1–1.0)
Monocytes Relative: 6 %
Neutro Abs: 3.3 10*3/uL (ref 1.7–7.7)
Neutrophils Relative %: 68 %
Platelets: 215 10*3/uL (ref 150–400)
RBC: 5.35 MIL/uL — ABNORMAL HIGH (ref 3.87–5.11)
RDW: 13.2 % (ref 11.5–15.5)
WBC: 4.9 10*3/uL (ref 4.0–10.5)
nRBC: 0 % (ref 0.0–0.2)

## 2020-02-21 LAB — LIPASE, BLOOD: Lipase: 35 U/L (ref 11–51)

## 2020-02-21 LAB — COMPREHENSIVE METABOLIC PANEL
ALT: 35 U/L (ref 0–44)
AST: 32 U/L (ref 15–41)
Albumin: 3.3 g/dL — ABNORMAL LOW (ref 3.5–5.0)
Alkaline Phosphatase: 79 U/L (ref 38–126)
Anion gap: 12 (ref 5–15)
BUN: 11 mg/dL (ref 6–20)
CO2: 22 mmol/L (ref 22–32)
Calcium: 8.6 mg/dL — ABNORMAL LOW (ref 8.9–10.3)
Chloride: 102 mmol/L (ref 98–111)
Creatinine, Ser: 0.91 mg/dL (ref 0.44–1.00)
GFR, Estimated: >60 mL/min (ref >60)
Glucose, Bld: 165 mg/dL — ABNORMAL HIGH (ref 70–99)
Potassium: 4.1 mmol/L (ref 3.5–5.1)
Sodium: 136 mmol/L (ref 135–145)
Total Bilirubin: 0.6 mg/dL (ref 0.3–1.2)
Total Protein: 7.7 g/dL (ref 6.5–8.1)

## 2020-02-21 MED ORDER — NAPROXEN 500 MG PO TABS: 500.0000 mg | ORAL_TABLET | Freq: Two times a day (BID) | ORAL | 0 refills | Status: DC

## 2020-02-21 MED ORDER — ONDANSETRON 4 MG PO TBDP: 4.0000 mg | ORAL_TABLET | Freq: Three times a day (TID) | ORAL | 0 refills | Status: DC | PRN

## 2020-02-21 MED ORDER — KETOROLAC TROMETHAMINE 30 MG/ML IJ SOLN
30.0000 mg | Freq: Once | INTRAMUSCULAR | Status: AC
  Administered 2020-02-21: 30 mg via INTRAVENOUS
  Filled 2020-02-21: qty 1

## 2020-02-21 MED ORDER — SODIUM CHLORIDE 0.9 % IV BOLUS
1000.0000 mL | Freq: Once | INTRAVENOUS | Status: AC
  Administered 2020-02-21: 1000 mL via INTRAVENOUS

## 2020-02-21 MED ORDER — ACETAMINOPHEN 325 MG PO TABS
650.0000 mg | ORAL_TABLET | Freq: Once | ORAL | Status: AC
  Administered 2020-02-21: 650 mg via ORAL
  Filled 2020-02-21: qty 2

## 2020-02-21 MED ORDER — METOCLOPRAMIDE HCL 5 MG/ML IJ SOLN
10.0000 mg | Freq: Once | INTRAMUSCULAR | Status: AC
  Administered 2020-02-21: 10 mg via INTRAVENOUS
  Filled 2020-02-21: qty 2

## 2020-02-21 MED ORDER — SODIUM CHLORIDE 0.9 % IV BOLUS
500.0000 mL | Freq: Once | INTRAVENOUS | Status: AC
  Administered 2020-02-21: 500 mL via INTRAVENOUS

## 2020-02-21 NOTE — Discharge Instructions (Signed)
Take the medications as needed to help with your symptoms. Make sure you are drinking plenty of fluids to prevent dehydration.  You can slowly advance your diet as tolerated. Follow-up at the [facility] care clinic listed below. Return to the ER if you start to experience worsening symptoms, uncontrollable vomiting, chest pain, shortness of breath.

## 2020-02-21 NOTE — ED Notes (Addendum)
Pt O2 was 98 while ambulating.

## 2020-02-21 NOTE — ED Provider Notes (Signed)
[facility] DEPT Provider Note   CSN: [id number] Arrival date & time: 02/21/20  1212     History Chief Complaint  Patient presents with   Cough   Emesis   Chills    Elaine Perez is a 35 y.o. female with a past medical history of hypertension, diabetes, asthma, obesity presenting to the ED with a chief complaint of chills, emesis, cough and shortness of breath.  Tested positive for Covid on 02/14/2020.  She has been taking antipyretics but states that "I cannot seem to break my fever."  Reports decreased appetite.  Emesis began a few days ago.  Denies any changes to bowel movements or urination.  Denies any chest pain but does have soreness throughout her entire body.  She has not been vaccinated against Covid.  Denies any hematemesis, hemoptysis, leg swelling.  HPI     Past Medical History:  Diagnosis Date   Asthma    Blood transfusion without reported diagnosis 2019   Diabetes mellitus without complication ([place])    Fibroids    Hypertension    Iron deficiency anemia 04/27/2018   Sleep apnea    Does not use CPAP    Patient Active Problem List   Diagnosis Date Noted   Iron deficiency anemia due to chronic blood loss 08/31/2018   Iron deficiency anemia 04/27/2018    Past Surgical History:  Procedure Laterality Date   GALLBLADDER SURGERY     REPEAT CESAREAN SECTION       OB History   No obstetric history on file.     Family History  Problem Relation Age of Onset   Breast cancer Other    Heart disease Mother    Lupus Maternal Aunt    Stomach cancer Maternal Aunt    Colon cancer Neg Hx    Esophageal cancer Neg Hx    Rectal cancer Neg Hx     Social History   Tobacco Use   Smoking status: Never Smoker   Smokeless tobacco: Never Used  Vaping Use   Vaping Use: Never used  Substance Use Topics   Alcohol use: Never   Drug use: Never    Home Medications Prior to Admission medications   Medication Sig  Start Date End Date Taking? Authorizing Provider  albuterol (PROVENTIL) (2.5 MG/3ML) 0.083% nebulizer solution Inhale 2.5 mg into the lungs every 6 (six) hours as needed for wheezing or shortness of breath.    Yes [provider]  albuterol (VENTOLIN HFA) 108 (90 Base) MCG/ACT inhaler Inhale 1-2 puffs into the lungs every 6 (six) hours as needed for wheezing or shortness of breath.  06/15/10  Yes [provider]  amLODipine (NORVASC) 5 MG tablet Take 5 mg by mouth daily. 01/18/20  Yes [provider]  naproxen (NAPROSYN) 500 MG tablet Take 1 tablet (500 mg total) by mouth 2 (two) times daily. 02/21/20  Yes Jamicheal Heard, PA-C  ondansetron (ZOFRAN ODT) 4 MG disintegrating tablet Take 1 tablet (4 mg total) by mouth every 8 (eight) hours as needed for nausea or vomiting. 02/21/20  Yes Aurorah Schlachter, PA-C  Fluticasone-Salmeterol (ADVAIR) 250-50 MCG/DOSE AEPB Inhale 1 puff into the lungs daily.  05/23/18   [provider]  hydrochlorothiazide (HYDRODIURIL) 25 MG tablet Take 25 mg by mouth daily. 03/12/19   [provider]  losartan (COZAAR) 100 MG tablet Take 100 mg by mouth daily. 01/18/20   [provider]  metFORMIN (GLUCOPHAGE) 500 MG tablet Take 500 mg by mouth 2 (two)  times daily. 04/01/19   [provider]  methocarbamol (ROBAXIN) 500 MG tablet Take 1 tablet (500 mg total) by mouth 2 (two) times daily. Patient not taking: Reported on 09/06/2019 03/05/19   [place]idence Lanius A, PA-C  naproxen sodium (ALEVE) 220 MG tablet Take 440 mg by mouth 2 (two) times daily as needed (pain.headache).    [provider]  pantoprazole (PROTONIX) 40 MG tablet Take 1 tablet (40 mg total) by mouth daily. Patient not taking: Reported on 09/11/2019 10/04/18   Thornton Park, MD  polyethylene glycol (MIRALAX / GLYCOLAX) 17 g packet Take 17 g by mouth daily.    [provider]    Allergies    Morphine and related  Review of Systems   Review of Systems   Constitutional: Positive for appetite change, chills and fever.  HENT: Negative for ear pain, rhinorrhea, sneezing and sore throat.   Eyes: Negative for photophobia and visual disturbance.  Respiratory: Positive for cough, chest tightness and shortness of breath. Negative for wheezing.   Cardiovascular: Negative for chest pain and palpitations.  Gastrointestinal: Positive for vomiting. Negative for abdominal pain, blood in stool, constipation, diarrhea and nausea.  Genitourinary: Negative for dysuria, hematuria and urgency.  Musculoskeletal: Negative for myalgias.  Skin: Negative for rash.  Neurological: Negative for dizziness, weakness and light-headedness.    Physical Exam Updated Vital Signs BP 132/87 (BP Location: Right Arm)    Pulse 94    Temp 99.4 F (37.4 C) (Oral)    Resp 16    Ht 5\' 1"  (1.549 m)    Wt 104.3 kg    LMP 05/05/2019 (Approximate)    SpO2 96%    BMI 43.46 kg/m   Physical Exam Vitals and nursing note reviewed.  Constitutional:      General: She is not in acute distress.    Appearance: She is well-developed and well-nourished.  HENT:     Head: Normocephalic and atraumatic.     Nose: Nose normal.  Eyes:     General: No scleral icterus.       Right eye: No discharge.        Left eye: No discharge.     Extraocular Movements: EOM normal.     Conjunctiva/sclera: Conjunctivae normal.  Cardiovascular:     Rate and Rhythm: Regular rhythm. Tachycardia present.     Pulses: Intact distal pulses.     Heart sounds: Normal heart sounds. No murmur heard. No friction rub. No gallop.   Pulmonary:     Effort: Pulmonary effort is normal. No respiratory distress.     Breath sounds: Normal breath sounds.  Abdominal:     General: Bowel sounds are normal. There is no distension.     Palpations: Abdomen is soft.     Tenderness: There is no abdominal tenderness. There is no guarding.  Musculoskeletal:        General: No edema. Normal range of motion.     Cervical back: Normal  range of motion and neck supple.  Skin:    General: Skin is warm and dry.     Findings: No rash.  Neurological:     Mental Status: She is alert.     Motor: No abnormal muscle tone.     Coordination: Coordination normal.  Psychiatric:        Mood and Affect: Mood and affect normal.     ED Results / Procedures / Treatments   Labs (all labs ordered are listed, but only abnormal results are displayed) Labs  Reviewed  COMPREHENSIVE METABOLIC PANEL - Abnormal; Notable for the following components:      Result Value   Glucose, Bld 165 (*)    Calcium 8.6 (*)    Albumin 3.3 (*)    All other components within normal limits  CBC WITH DIFFERENTIAL/PLATELET - Abnormal; Notable for the following components:   RBC 5.35 (*)    Hemoglobin 15.1 (*)    HCT 46.6 (*)    All other components within normal limits  LIPASE, BLOOD    EKG None  Radiology DG Chest Portable 1 View  Result Date: 02/21/2020 CLINICAL DATA:  Pt complains of chills, emesis, cough for the past week. She tested positive for COVID on 12/31. Pt not vaccinated. She took tylenol this morning. EXAM: PORTABLE CHEST - 1 VIEW COMPARISON:  10/15/2019 FINDINGS: Relatively low lung volumes with some mild patchy ill-defined airspace opacities in the lung bases, left greater than right. Heart size and mediastinal contours are within normal limits. No effusion. Visualized bones unremarkable. IMPRESSION: Low volumes with mild bibasilar airspace opacities, left greater than right. Electronically Signed   By: Lucrezia Europe M.D.   On: 02/21/2020 16:33    Procedures Procedures (including critical care time)  Medications Ordered in ED Medications  sodium chloride 0.9 % bolus 1,000 mL (0 mLs Intravenous Stopped 02/21/20 1840)  metoCLOPramide (REGLAN) injection 10 mg (10 mg Intravenous Given 02/21/20 1647)  ketorolac (TORADOL) 30 MG/ML injection 30 mg (30 mg Intravenous Given 02/21/20 1648)  acetaminophen (TYLENOL) tablet 650 mg (650 mg Oral Given  02/21/20 1652)  sodium chloride 0.9 % bolus 500 mL (500 mLs Intravenous New Bag/Given 02/21/20 1839)    ED Course  I have reviewed the triage vital signs and the nursing notes.  Pertinent labs & imaging results that were available during my care of the patient were reviewed by me and considered in my medical decision making (see chart for details).  Clinical Course as of 02/21/20 1858  Fri Feb 21, 2020  1751 Potassium: 4.1 [HK]  1752 Creatinine: 0.91 [HK]  1752 Patient oxygen saturations 98% on room air with ambulation. [HK]  1841 Pulse Rate: 100 [HK]    Clinical Course User Index [HK] Delia Heady, PA-C   MDM Rules/Calculators/A&P                          Ladora Osterberg was evaluated in Emergency Department on 02/21/20 for the symptoms described in the history of present illness. He/she was evaluated in the context of the global COVID-19 pandemic, which necessitated consideration that the patient might be at risk for infection with the SARS-CoV-2 virus that causes COVID-19. Institutional protocols and algorithms that pertain to the evaluation of patients at risk for COVID-19 are in a state of rapid change based on information released by regulatory bodies including the CDC and federal and state organizations. These policies and algorithms were followed during the patient's care in the ED.  35 year old female with a past medical history of hypertension, diabetes, asthma and obesity presenting to the ED with a chief complaint of chills, emesis, cough and shortness of breath.  She tested positive for Covid on 02/14/2020.  She has been taking antipyretics but continues to have a fever.  Reports emesis that began a few days ago.  No chest pain.  She has not been vaccinated against Covid.  On exam patient is tachycardic.  She is febrile.  Oxygen saturations above 95% on room air.  She is  speaking complete sentences without any signs of respiratory distress.  Chest x-ray here shows bibasilar opacities  concerning for Covid infection.  EKG shows sinus tachycardia.  CMP and lipase unremarkable.  CBC showing hemoconcentration.  Patient given IV fluids, Toradol and Tylenol and will reassess.  On recheck, patient's tachycardia has improved with IV fluids and defervescence.  Her tachypnea has improved as well.  Patient ambulated here without hypoxia.  She is able to tolerate p.o. intake without difficulty.  Nausea has improved.  No criteria for admission at this time.  She is comfortable with discharge home, symptomatic treatment increasing her hydration.  Will advise her to continue antipyretics and follow-up at post Covid care clinic.  Return precautions given.  All imaging, if done today, including plain films, CT scans, and ultrasounds, independently reviewed by me, and interpretations confirmed via formal radiology reads.  Patient is hemodynamically stable, in NAD, and able to ambulate in the ED. Evaluation does not show pathology that would require ongoing emergent intervention or inpatient treatment. I explained the diagnosis to the patient. Pain has been managed and has no complaints prior to discharge. Patient is comfortable with above plan and is stable for discharge at this time. All questions were answered prior to disposition. Strict return precautions for returning to the ED were discussed. Encouraged follow up with PCP.   An After Visit Summary was printed and given to the patient.   Portions of this note were generated with [employer]. Dictation errors may occur despite best attempts at proofreading.  Final Clinical Impression(s) / ED Diagnoses Final diagnoses:  COVID-19 virus infection  Dehydration    Rx / DC Orders ED Discharge Orders         Ordered    ondansetron (ZOFRAN ODT) 4 MG disintegrating tablet  Every 8 hours PRN        02/21/20 1847    naproxen (NAPROSYN) 500 MG tablet  2 times daily        02/21/20 1847           Delia Heady, PA-C 02/21/20  1858    Carmin Muskrat, MD 02/22/20 1359

## 2020-02-21 NOTE — ED Triage Notes (Signed)
Pt complains of chills, emesis, cough for the past week. She tested positive for COVID on 12/31. Pt not vaccinated. She took tylenol this morning.

## 2020-02-21 NOTE — ED Notes (Signed)
Pt ambulatory from triage.

## 2020-02-21 NOTE — ED Notes (Signed)
Pt able to take sips of water.

## 2020-02-28 ENCOUNTER — Other Ambulatory Visit: Payer: Medicaid Other

## 2020-02-28 ENCOUNTER — Ambulatory Visit: Payer: Medicaid Other

## 2020-03-03 ENCOUNTER — Ambulatory Visit: Payer: Medicaid Other

## 2020-03-05 ENCOUNTER — Ambulatory Visit: Payer: Medicaid Other

## 2020-07-21 ENCOUNTER — Encounter (HOSPITAL_COMMUNITY): Payer: Self-pay | Admitting: *Deleted

## 2020-07-21 ENCOUNTER — Emergency Department (HOSPITAL_COMMUNITY): Payer: Medicaid Other

## 2020-07-21 ENCOUNTER — Emergency Department (HOSPITAL_BASED_OUTPATIENT_CLINIC_OR_DEPARTMENT_OTHER)
Admit: 2020-07-21 | Discharge: 2020-07-21 | Disposition: A | Discharge status: Home / Self Care | Payer: Medicaid Other | Attending: Emergency Medicine | Admitting: Emergency Medicine

## 2020-07-21 ENCOUNTER — Emergency Department (HOSPITAL_COMMUNITY)
Admission: EM | Admit: 2020-07-21 | Discharge: 2020-07-21 | Disposition: A | Discharge status: Home / Self Care | Payer: Medicaid Other | Attending: Emergency Medicine | Admitting: Emergency Medicine

## 2020-07-21 ENCOUNTER — Other Ambulatory Visit: Payer: Self-pay

## 2020-07-21 DIAGNOSIS — Z79899 Other long term (current) drug therapy: Secondary | ICD-10-CM | POA: Diagnosis not present

## 2020-07-21 DIAGNOSIS — Z7984 Long term (current) use of oral hypoglycemic drugs: Secondary | ICD-10-CM | POA: Diagnosis not present

## 2020-07-21 DIAGNOSIS — I10 Essential (primary) hypertension: Secondary | ICD-10-CM | POA: Diagnosis not present

## 2020-07-21 DIAGNOSIS — R519 Headache, unspecified: Secondary | ICD-10-CM | POA: Diagnosis not present

## 2020-07-21 DIAGNOSIS — Z7951 Long term (current) use of inhaled steroids: Secondary | ICD-10-CM | POA: Insufficient documentation

## 2020-07-21 DIAGNOSIS — J45909 Unspecified asthma, uncomplicated: Secondary | ICD-10-CM | POA: Diagnosis not present

## 2020-07-21 DIAGNOSIS — M25561 Pain in right knee: Secondary | ICD-10-CM | POA: Insufficient documentation

## 2020-07-21 DIAGNOSIS — M7989 Other specified soft tissue disorders: Secondary | ICD-10-CM | POA: Diagnosis not present

## 2020-07-21 DIAGNOSIS — R600 Localized edema: Secondary | ICD-10-CM | POA: Diagnosis not present

## 2020-07-21 DIAGNOSIS — E119 Type 2 diabetes mellitus without complications: Secondary | ICD-10-CM | POA: Diagnosis not present

## 2020-07-21 LAB — CBC WITH DIFFERENTIAL/PLATELET
Abs Immature Granulocytes: 0.02 10*3/uL (ref 0.00–0.07)
Basophils Absolute: 0 10*3/uL (ref 0.0–0.1)
Basophils Relative: 0 %
Eosinophils Absolute: 0.2 10*3/uL (ref 0.0–0.5)
Eosinophils Relative: 3 %
HCT: 44.8 % (ref 36.0–46.0)
Hemoglobin: 14.7 g/dL (ref 12.0–15.0)
Immature Granulocytes: 0 %
Lymphocytes Relative: 36 %
Lymphs Abs: 2.7 10*3/uL (ref 0.7–4.0)
MCH: 29.1 pg (ref 26.0–34.0)
MCHC: 32.8 g/dL (ref 30.0–36.0)
MCV: 88.7 fL (ref 80.0–100.0)
Monocytes Absolute: 0.6 10*3/uL (ref 0.1–1.0)
Monocytes Relative: 8 %
Neutro Abs: 4 10*3/uL (ref 1.7–7.7)
Neutrophils Relative %: 53 %
Platelets: 296 10*3/uL (ref 150–400)
RBC: 5.05 MIL/uL (ref 3.87–5.11)
RDW: 12.7 % (ref 11.5–15.5)
WBC: 7.6 10*3/uL (ref 4.0–10.5)
nRBC: 0 % (ref 0.0–0.2)

## 2020-07-21 LAB — BASIC METABOLIC PANEL
Anion gap: 7 (ref 5–15)
BUN: 13 mg/dL (ref 6–20)
CO2: 24 mmol/L (ref 22–32)
Calcium: 9.3 mg/dL (ref 8.9–10.3)
Chloride: 106 mmol/L (ref 98–111)
Creatinine, Ser: 0.69 mg/dL (ref 0.44–1.00)
GFR, Estimated: >60 mL/min (ref >60)
Glucose, Bld: 158 mg/dL — ABNORMAL HIGH (ref 70–99)
Potassium: 4.1 mmol/L (ref 3.5–5.1)
Sodium: 137 mmol/L (ref 135–145)

## 2020-07-21 MED ORDER — HYDROCHLOROTHIAZIDE 25 MG PO TABS: 25.0000 mg | ORAL_TABLET | Freq: Every day | ORAL | 1 refills | Status: AC

## 2020-07-21 MED ORDER — HYDROCHLOROTHIAZIDE 12.5 MG PO CAPS
25.0000 mg | ORAL_CAPSULE | Freq: Once | ORAL | Status: AC
  Administered 2020-07-21: 25 mg via ORAL
  Filled 2020-07-21: qty 2

## 2020-07-21 NOTE — Discharge Instructions (Addendum)
The testing today does not show any serious problems.  We are prescribing your diuretic medicine to help with your blood pressure.  Sometimes this can lower the blood potassium. Make sure you eat foods which contain a lot of potassium.  Instructions attached to this document.  Follow-up with your primary care doctor for blood pressure check in 1 or 2 weeks.  For pain of your right knee, use Tylenol every 4 hours.  Sometimes using heating pad on it can help as well.

## 2020-07-21 NOTE — ED Provider Notes (Signed)
Emergency Medicine Provider Triage Evaluation Note  Elaine Perez , a 35 y.o. female  was evaluated in triage.  Pt complains of high blood, headache and leg swelling.  Patient reports that for the past 2 days she has been having a gradual onset headache associated with some blurred vision and tingling in her left arm.  She noted with the symptoms that her blood pressure has been elevated despite taking her medications.  She also reports for the past 2 weeks she has noticed gradually worsening swelling in her right leg that has become somewhat painful and yesterday started to note some swelling in her left foot as well.  No chest pain or shortness of breath.  Review of Systems  Positive: Leg swelling, headache, blurred vision, arm tingling Negative: Fever, chest pain, shortness of breath  Physical Exam  BP (!) 145/116 (BP Location: Right Arm)   Pulse 73   Temp 98.4 F (36.9 C) (Oral)   Resp 16   Ht 5\' 1"  (1.549 m)   Wt 104.3 kg   LMP 05/05/2019 (Approximate)   SpO2 100%   BMI 43.46 kg/m  Gen:   Awake, no distress   Resp:  Normal effort  MSK:   Moves extremities without difficulty  Other:  No focal neurologic deficits noted, does report slight change in sensation on the left upper extremity  Medical Decision Making  Medically screening exam initiated at 10:59 AM.  Appropriate orders placed.  Elaine Perez was informed that the remainder of the evaluation will be completed by another provider, this initial triage assessment does not replace that evaluation, and the importance of remaining in the ED until their evaluation is complete.     Jacqlyn Larsen, PA-C 07/21/20 1105    Varney Biles, MD 07/21/20 1549

## 2020-07-21 NOTE — Progress Notes (Signed)
Right lower extremity venous duplex has been completed. Preliminary results can be found in CV Proc through chart review.  Results were given to Benedetto Goad PA.  07/21/20 12:20 PM Elaine Perez RVT

## 2020-07-21 NOTE — ED Notes (Signed)
ED Provider at bedside. 

## 2020-07-21 NOTE — ED Provider Notes (Signed)
[facility] Provider Note   CSN: [id number] Arrival date & time: 07/21/20  0631     History Chief Complaint  Patient presents with  . Headache  . Leg Swelling    Elaine Perez is a 35 y.o. female.  HPI Patient presents for multiple symptoms including elevated blood pressure, headache and leg swelling.  She takes antihypertensive medication.  Currently only on Coreg.  She has been on HCTZ in the past but ran out of the medication.  She is currently living in [place] and has a physician in [place], [place].  She plans on maintaining contact with that physician for medical care.  She denies paresthesia, focal weakness, shortness of breath, cough, chest pain, change in bowel or urinary habits.  There are no other known active modifying factors.    Past Medical History:  Diagnosis Date  . Asthma   . Blood transfusion without reported diagnosis 2019  . Diabetes mellitus without complication ([place])   . Fibroids   . Hypertension   . Iron deficiency anemia 04/27/2018  . Sleep apnea    Does not use CPAP    Patient Active Problem List   Diagnosis Date Noted  . Iron deficiency anemia due to chronic blood loss 08/31/2018  . Iron deficiency anemia 04/27/2018    Past Surgical History:  Procedure Laterality Date  . GALLBLADDER SURGERY    . REPEAT CESAREAN SECTION       OB History   No obstetric history on file.     Family History  Problem Relation Age of Onset  . Breast cancer Other   . Heart disease Mother   . Lupus Maternal Aunt   . Stomach cancer Maternal Aunt   . Colon cancer Neg Hx   . Esophageal cancer Neg Hx   . Rectal cancer Neg Hx     Social History   Tobacco Use  . Smoking status: Never Smoker  . Smokeless tobacco: Never Used  Vaping Use  . Vaping Use: Never used  Substance Use Topics  . Alcohol use: Never  . Drug use: Never    Home Medications Prior to Admission medications   Medication Sig Start Date  End Date Taking? Authorizing Provider  albuterol (PROVENTIL) (2.5 MG/3ML) 0.083% nebulizer solution Inhale 2.5 mg into the lungs every 6 (six) hours as needed for wheezing or shortness of breath.     [provider]  albuterol (VENTOLIN HFA) 108 (90 Base) MCG/ACT inhaler Inhale 1-2 puffs into the lungs every 6 (six) hours as needed for wheezing or shortness of breath.  06/15/10   [provider]  amLODipine (NORVASC) 5 MG tablet Take 5 mg by mouth daily. 01/18/20   [provider]  Fluticasone-Salmeterol (ADVAIR) 250-50 MCG/DOSE AEPB Inhale 1 puff into the lungs daily.  05/23/18   [provider]  hydrochlorothiazide (HYDRODIURIL) 25 MG tablet Take 25 mg by mouth daily. 03/12/19   [provider]  losartan (COZAAR) 100 MG tablet Take 100 mg by mouth daily. 01/18/20   [provider]  metFORMIN (GLUCOPHAGE) 500 MG tablet Take 500 mg by mouth 2 (two) times daily. 04/01/19   [provider]  methocarbamol (ROBAXIN) 500 MG tablet Take 1 tablet (500 mg total) by mouth 2 (two) times daily. Patient not taking: Reported on 09/06/2019 03/05/19   Providence Lanius A, PA-C  naproxen (NAPROSYN) 500 MG tablet Take 1 tablet (500 mg total) by mouth 2 (two) times daily. 02/21/20   Delia Heady, PA-C  naproxen sodium (ALEVE) 220 MG tablet Take 440 mg by mouth 2 (two) times daily as needed (pain.headache).    [provider]  ondansetron (ZOFRAN ODT) 4 MG disintegrating tablet Take 1 tablet (4 mg total) by mouth every 8 (eight) hours as needed for nausea or vomiting. 02/21/20   Khatri, Hina, PA-C  pantoprazole (PROTONIX) 40 MG tablet Take 1 tablet (40 mg total) by mouth daily. Patient not taking: Reported on 09/11/2019 10/04/18   Thornton Park, MD  polyethylene glycol (MIRALAX / GLYCOLAX) 17 g packet Take 17 g by mouth daily.    [provider]    Allergies    Morphine and related  Review of Systems   Review of Systems  All other systems  reviewed and are negative.   Physical Exam Updated Vital Signs BP (!) 153/100   Pulse 68   Temp 98.4 F (36.9 C) (Oral)   Resp 18   Ht 5\' 1"  (1.549 m)   Wt 104.3 kg   LMP 05/05/2019 (Approximate)   SpO2 100%   BMI 43.46 kg/m   Physical Exam Vitals and nursing note reviewed.  Constitutional:      General: She is not in acute distress.    Appearance: She is well-developed. She is obese. She is not ill-appearing, toxic-appearing or diaphoretic.  HENT:     Head: Normocephalic and atraumatic.     Right Ear: External ear normal.     Left Ear: External ear normal.  Eyes:     Conjunctiva/sclera: Conjunctivae normal.     Pupils: Pupils are equal, round, and reactive to light.  Neck:     Trachea: Phonation normal.  Cardiovascular:     Rate and Rhythm: Normal rate and regular rhythm.     Heart sounds: Normal heart sounds.  Pulmonary:     Effort: Pulmonary effort is normal.     Breath sounds: Normal breath sounds.  Abdominal:     General: There is no distension.     Palpations: Abdomen is soft.     Tenderness: There is no abdominal tenderness.  Musculoskeletal:        General: Tenderness (Right knee, mild, resists flexion secondary to pain) present. No signs of injury. Normal range of motion.     Cervical back: Normal range of motion and neck supple.     Right lower leg: Edema present.     Left lower leg: Edema present.  Skin:    General: Skin is warm and dry.  Neurological:     Mental Status: She is alert and oriented to person, place, and time.     Cranial Nerves: No cranial nerve deficit.     Sensory: No sensory deficit.     Motor: No abnormal muscle tone.     Coordination: Coordination normal.  Psychiatric:        Mood and Affect: Mood normal.        Behavior: Behavior normal.        Thought Content: Thought content normal.        Judgment: Judgment normal.     ED Results / Procedures / Treatments   Labs (all labs ordered are listed, but only abnormal results  are displayed) Labs Reviewed  BASIC METABOLIC PANEL - Abnormal; Notable for the following components:      Result Value   Glucose, Bld 158 (*)    All other components within normal limits  CBC WITH DIFFERENTIAL/PLATELET    EKG None  Radiology DG Chest 2 View  Result  Date: 07/21/2020 CLINICAL DATA:  Hypertension EXAM: CHEST - 2 VIEW COMPARISON:  02/21/2020 FINDINGS: Heart and mediastinal contours are within normal limits. No focal opacities or effusions. No acute bony abnormality. Low lung volumes. IMPRESSION: No active cardiopulmonary disease. Electronically Signed   By: Rolm Baptise M.D.   On: 07/21/2020 11:27   CT Head Wo Contrast  Result Date: 07/21/2020 CLINICAL DATA:  Headache EXAM: CT HEAD WITHOUT CONTRAST TECHNIQUE: Contiguous axial images were obtained from the base of the skull through the vertex without intravenous contrast. COMPARISON:  None. FINDINGS: Brain: No acute intracranial abnormality. Specifically, no hemorrhage, hydrocephalus, mass lesion, acute infarction, or significant intracranial injury. Vascular: No hyperdense vessel or unexpected calcification. Skull: No acute calvarial abnormality. Sinuses/Orbits: No acute findings Other: None IMPRESSION: Normal study. Electronically Signed   By: Rolm Baptise M.D.   On: 07/21/2020 11:26   VAS [place] LOWER EXTREMITY VENOUS (DVT) (ONLY [facility] & [facility] 7a-7p)  Result Date: 07/21/2020  Lower Venous DVT Study Patient Name:  Elaine Perez  Date of Exam:   07/21/2020 Medical Rec #: [id number]      Accession #:    [id number] Date of Birth: 03-May-1985      Patient Gender: F Patient Age:   52Y Exam Location:  [facility] Procedure:      VAS [place] LOWER EXTREMITY VENOUS (DVT) Referring Phys: [id number] KELSEY N FORD --------------------------------------------------------------------------------  Indications: Swelling.  Risk Factors: None identified. Limitations: Body habitus and poor ultrasound/tissue interface. Comparison Study: No prior studies.  Performing Technologist: Oliver Hum RVT  Examination Guidelines: A complete evaluation includes B-mode imaging, spectral Doppler, color Doppler, and power Doppler as needed of all accessible portions of each vessel. Bilateral testing is considered an integral part of a complete examination. Limited examinations for reoccurring indications may be performed as noted. The reflux portion of the exam is performed with the patient in reverse Trendelenburg.  +---------+---------------+---------+-----------+----------+-------------------+ RIGHT    CompressibilityPhasicitySpontaneityPropertiesThrombus Aging      +---------+---------------+---------+-----------+----------+-------------------+ CFV      Full           Yes      Yes                                      +---------+---------------+---------+-----------+----------+-------------------+ SFJ      Full                                                             +---------+---------------+---------+-----------+----------+-------------------+ FV Prox  Full                                                             +---------+---------------+---------+-----------+----------+-------------------+ FV Mid   Full                                                             +---------+---------------+---------+-----------+----------+-------------------+ FV  DistalFull           Yes      Yes                                      +---------+---------------+---------+-----------+----------+-------------------+ PFV      Full                                                             +---------+---------------+---------+-----------+----------+-------------------+ POP      Full           Yes      Yes                                      +---------+---------------+---------+-----------+----------+-------------------+ PTV      Full                                                              +---------+---------------+---------+-----------+----------+-------------------+ PERO                                                  Not well visualized +---------+---------------+---------+-----------+----------+-------------------+   +----+---------------+---------+-----------+----------+--------------+ LEFTCompressibilityPhasicitySpontaneityPropertiesThrombus Aging +----+---------------+---------+-----------+----------+--------------+ CFV Full           Yes      Yes                                 +----+---------------+---------+-----------+----------+--------------+    Summary: RIGHT: - There is no evidence of deep vein thrombosis in the lower extremity. However, portions of this examination were limited- see technologist comments above.  - No cystic structure found in the popliteal fossa.  LEFT: - No evidence of common femoral vein obstruction.  *See table(s) above for measurements and observations.    Preliminary     Procedures Procedures can  Medications Ordered in ED Medications - No data to display  ED Course  I have reviewed the triage vital signs and the nursing notes.  Pertinent labs & imaging results that were available during my care of the patient were reviewed by me and considered in my medical decision making (see chart for details).    MDM Rules/Calculators/A&P                           Patient Vitals for the past 24 hrs:  BP Temp Temp src Pulse Resp SpO2 Height Weight  07/21/20 1630 (!) 156/83 -- -- 79 (!) 21 100 % -- --  07/21/20 1533 (!) 153/100 -- -- 68 18 100 % -- --  07/21/20 1331 (!) 178/108 -- -- 76 16 100 % -- --  07/21/20 1100 (!) 161/110 -- -- 69 17 95 % -- --  07/21/20 0937 (!) 145/116 -- -- 73  16 100 % -- --  07/21/20 0655 (!) 174/93 98.4 F (36.9 C) Oral 72 18 93 % 5\' 1"  (1.549 m) 104.3 kg    4:50 PM Reevaluation with update and discussion. After initial assessment and treatment, an updated evaluation reveals she is comfortable now  and agreeable for discharge.  She has no further complaints.  Findings discussed and questions answered. Daleen Bo   Medical Decision Making:  This patient is presenting for evaluation of nonspecific symptoms including headache and leg swelling, which does require a range of treatment options, and is a complaint that involves a moderate risk of morbidity and mortality. The differential diagnoses include hypertensive urgency, congestive heart failure, peripheral edema. I decided to review old records, and in summary middle-aged female presenting with nonspecific symptoms and history of hypertension.  I did not require additional historical information from anyone.  Clinical Laboratory Tests Ordered, included CBC and Metabolic panel. Review indicates normal except glucose high. Radiologic Tests Ordered, included chest x-ray.  I independently Visualized: Radiographic images, which show no edema or infiltrate    Critical Interventions-clinical evaluation, laboratory testing, observation reassessment  After These Interventions, the Patient was reevaluated and was found stable for discharge.  Patient with headache, and mild hypertension.  Doubt hypertensive urgency or intracranial bleeding.  Peripheral edema is likely secondary to multiple comorbidities, without evidence for acute congestive heart failure.  She has previously been on HCTZ, for hypertension, and will likely benefit on restarting that for both blood pressure, and peripheral edema.  No indication for hospitalization at this time.  CRITICAL CARE-no Performed by: Daleen Bo  Nursing Notes Reviewed/ Care Coordinated Applicable Imaging Reviewed Interpretation of Laboratory Data incorporated into ED treatment  The patient appears reasonably screened and/or stabilized for discharge and I doubt any other medical condition or other [facility] requiring further screening, evaluation, or treatment in the ED at this time prior to  discharge.  Plan: Home Medications-continue usual; Home Treatments-high potassium diet; return here if the recommended treatment, does not improve the symptoms; Recommended follow up-PCP for blood pressure check in 1 week.  Follow-up on potassium level at that time.     Final Clinical Impression(s) / ED Diagnoses Final diagnoses:  Hypertension, unspecified type  Nonintractable headache, unspecified chronicity pattern, unspecified headache type  Right knee pain, unspecified chronicity    Rx / DC Orders ED Discharge Orders         Ordered    hydrochlorothiazide (HYDRODIURIL) 25 MG tablet  Daily        07/21/20 1752           Daleen Bo, MD 07/22/20 1155

## 2020-07-21 NOTE — ED Triage Notes (Signed)
Pt complains of headache x 2 days, right leg swelling x 2 weeks.

## 2020-10-18 IMAGING — DX DG CHEST 1V PORT
1 series · 1 of 1 positions shown · non-contrast
Comparison: 09/12/2017 chest radiograph.

CLINICAL DATA: Shortness of breath

EXAM:
PORTABLE CHEST 1 VIEW

[chest ap]
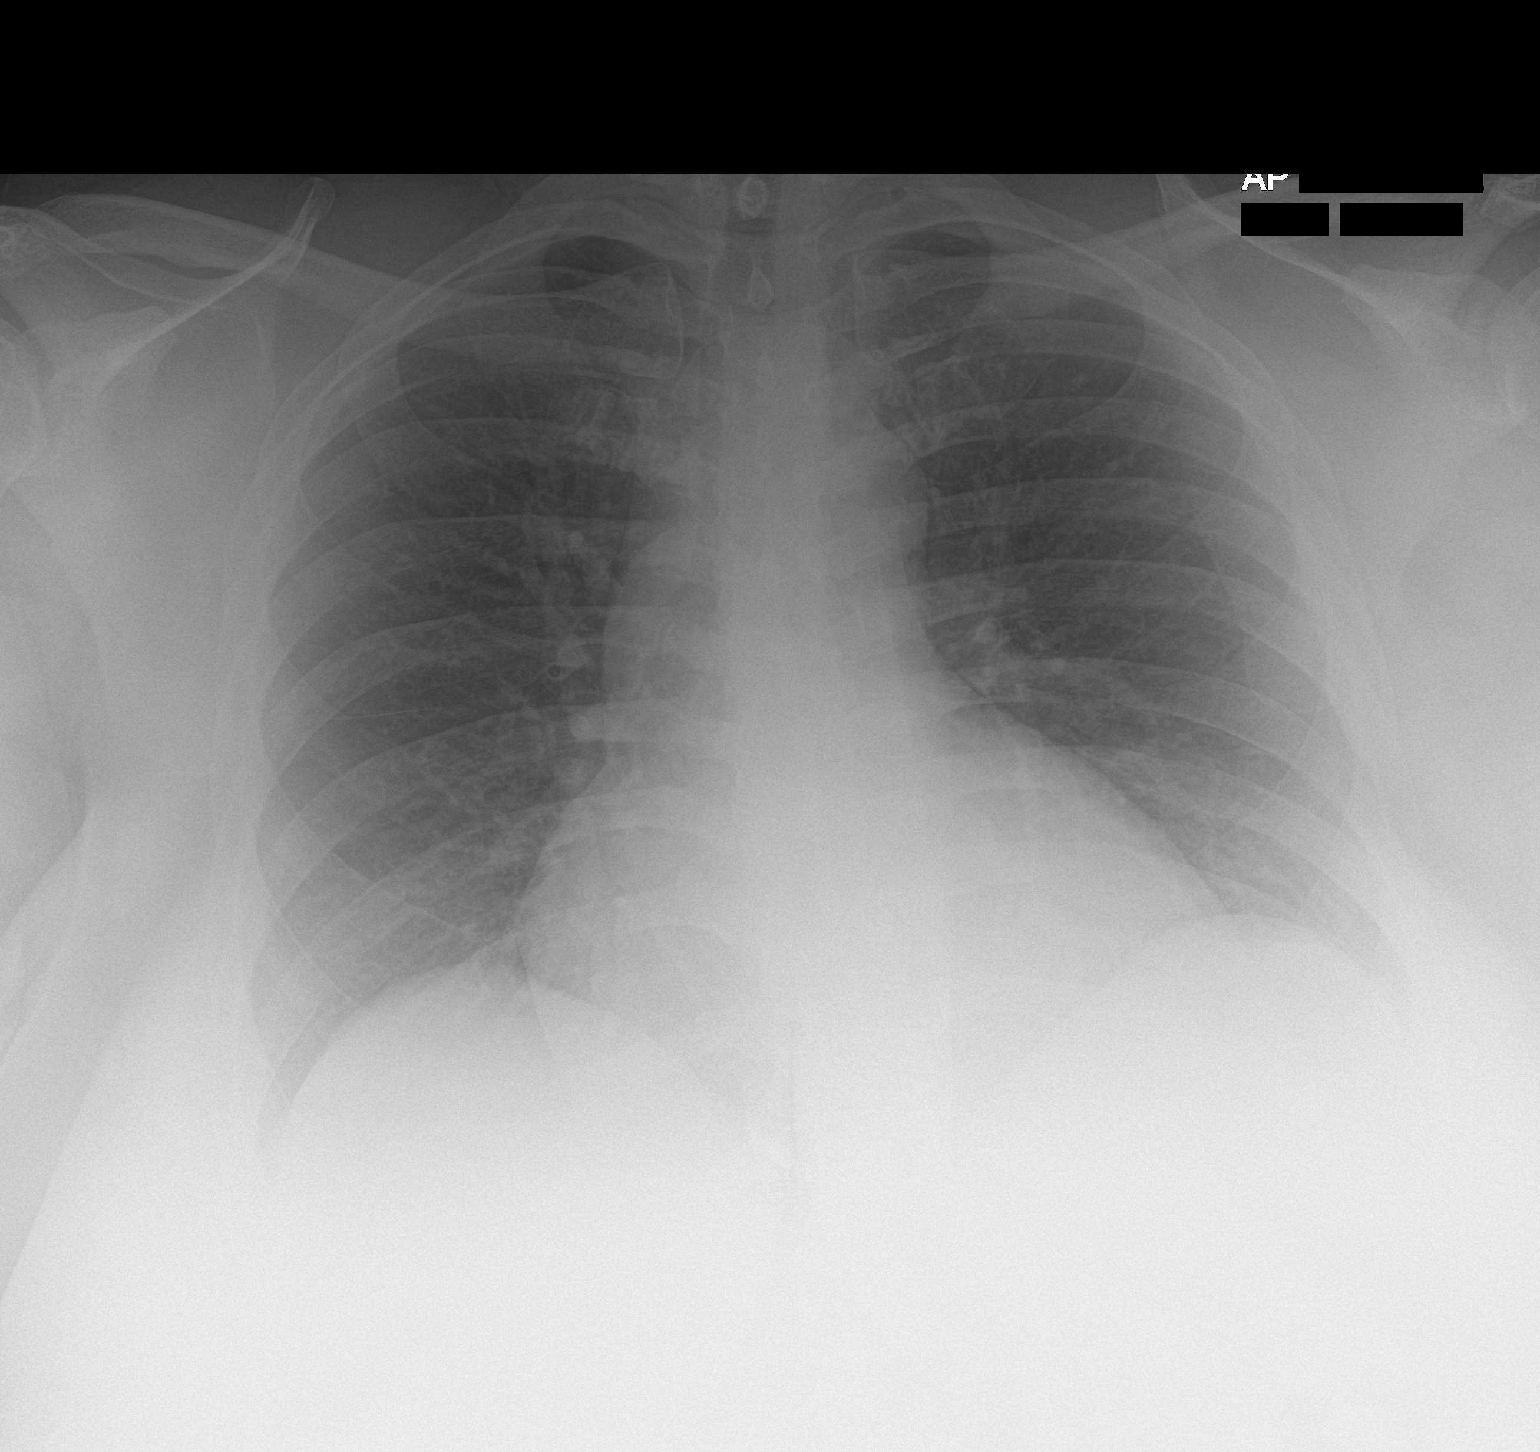

[1 of 1 positions shown; findings below may reference images not displayed]

FINDINGS: Clear lungs.

No pneumothorax or pleural effusion.

Cardiomediastinal silhouette is unremarkable.

No acute osseous abnormality.
IMPRESSION: No focal airspace disease.

## 2021-01-04 ENCOUNTER — Other Ambulatory Visit: Payer: Self-pay

## 2021-01-04 ENCOUNTER — Encounter (HOSPITAL_BASED_OUTPATIENT_CLINIC_OR_DEPARTMENT_OTHER): Payer: Self-pay

## 2021-01-04 ENCOUNTER — Encounter: Payer: Self-pay | Admitting: Hematology and Oncology

## 2021-01-04 ENCOUNTER — Emergency Department (HOSPITAL_BASED_OUTPATIENT_CLINIC_OR_DEPARTMENT_OTHER)
Admission: EM | Admit: 2021-01-04 | Discharge: 2021-01-05 | Disposition: A | Discharge status: Home / Self Care | Payer: Medicaid Other | Attending: Emergency Medicine | Admitting: Emergency Medicine

## 2021-01-04 ENCOUNTER — Emergency Department (HOSPITAL_BASED_OUTPATIENT_CLINIC_OR_DEPARTMENT_OTHER): Payer: Medicaid Other

## 2021-01-04 DIAGNOSIS — Z79899 Other long term (current) drug therapy: Secondary | ICD-10-CM | POA: Insufficient documentation

## 2021-01-04 DIAGNOSIS — E669 Obesity, unspecified: Secondary | ICD-10-CM | POA: Insufficient documentation

## 2021-01-04 DIAGNOSIS — J45909 Unspecified asthma, uncomplicated: Secondary | ICD-10-CM | POA: Diagnosis not present

## 2021-01-04 DIAGNOSIS — E119 Type 2 diabetes mellitus without complications: Secondary | ICD-10-CM | POA: Diagnosis not present

## 2021-01-04 DIAGNOSIS — M25561 Pain in right knee: Secondary | ICD-10-CM | POA: Insufficient documentation

## 2021-01-04 DIAGNOSIS — I10 Essential (primary) hypertension: Secondary | ICD-10-CM | POA: Diagnosis not present

## 2021-01-04 DIAGNOSIS — M79604 Pain in right leg: Secondary | ICD-10-CM | POA: Diagnosis not present

## 2021-01-04 MED ORDER — IBUPROFEN 400 MG PO TABS
600.0000 mg | ORAL_TABLET | Freq: Once | ORAL | Status: AC
  Administered 2021-01-04: 600 mg via ORAL
  Filled 2021-01-04: qty 1

## 2021-01-04 MED ORDER — ACETAMINOPHEN 500 MG PO TABS
1000.0000 mg | ORAL_TABLET | Freq: Once | ORAL | Status: AC
  Administered 2021-01-04: 1000 mg via ORAL
  Filled 2021-01-04: qty 2

## 2021-01-04 NOTE — Discharge Instructions (Addendum)
If you develop fever, redness over your knee or leg, new or worsening swelling, excessive heat to the joint, or any other new/concerning symptoms then return to the ER for evaluation.

## 2021-01-04 NOTE — ED Triage Notes (Signed)
Patient here POV from UC for Leg Swelling.  Patient has been having Lower Leg Swelling to Right Leg for approximately 3 days. No Fevers or Recent Trauma. Patient seen and sent by UC for DVT Study.  A&Ox4. GCS 15. Ambulatory. NAD Noted during Triage.

## 2021-01-04 NOTE — ED Notes (Signed)
US at bedside

## 2021-01-04 NOTE — ED Provider Notes (Signed)
[facility] EMERGENCY DEPT Provider Note   CSN: [id number] Arrival date & time: 01/04/21  2042     History Chief Complaint  Patient presents with   Leg Swelling    Elaine Perez is a 35 y.o. female.  HPI 35 year old female presents with severe right leg pain.  Has been ongoing for a couple days.  Primarily hurts in her right thigh and her all the way around her knee.  It is painful to walk on.  She feels like her whole leg is swollen.  No recent travel.  No chest pain or shortness of breath.  She has been taking Tylenol and ibuprofen relief.  When I am first talking to her, she is getting an ultrasound of her lower extremity to rule out DVT which was ordered in triage.  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 07/04/2019   Iron deficiency anemia due to chronic blood loss 08/31/2018   Iron deficiency anemia 04/27/2018   Leiomyoma of uterus, unspecified 06/15/2016   Anemia, deficiency 05/13/2015   DDD (degenerative disc disease), lumbar 05/16/2013   BMI 40.0-44.9, adult ([place]) 12/17/2012   OSA on CPAP 12/17/2012   Depression 05/11/2012   Diabetes mellitus type 2 in obese ([place]) 12/23/2011   Hypertension 12/23/2011   Female pelvic pain 12/23/2011    Past Surgical History:  Procedure Laterality Date   GALLBLADDER SURGERY     REPEAT CESAREAN SECTION       OB History   No obstetric history on file.     Family History  Problem Relation Age of Onset   Breast cancer Other    Heart disease Mother    Lupus Maternal Aunt    Stomach cancer Maternal Aunt    Colon cancer Neg Hx    Esophageal cancer Neg Hx    Rectal cancer Neg Hx      Social History   Tobacco Use   Smoking status: Never   Smokeless tobacco: Never  Vaping Use   Vaping Use: Never used  Substance Use Topics   Alcohol use: Never   Drug use: Never    Home Medications Prior to Admission medications   Medication Sig Start Date End Date Taking? Authorizing Provider  carvedilol (COREG) 6.25 MG tablet Take 6.25 mg by mouth 2 (two) times daily. 05/16/20   [provider]  Fluticasone-Salmeterol (ADVAIR) 250-50 MCG/DOSE AEPB Inhale 1 puff into the lungs daily.  05/23/18   [provider]  hydrochlorothiazide (HYDRODIURIL) 25 MG tablet Take 1 tablet (25 mg total) by mouth daily. 07/21/20   Daleen Bo, MD  Multiple Vitamin (MULTIVITAMIN WITH MINERALS) TABS tablet Take 1 tablet by mouth daily.    [provider]  naproxen (NAPROSYN) 500 MG tablet Take 1 tablet (500 mg total) by mouth 2 (two) times daily. Patient not taking: Reported on 07/21/2020 02/21/20   Delia Heady, PA-C  naproxen sodium (ALEVE) 220 MG tablet Take 440 mg by mouth 2 (two) times daily as needed (pain.headache).    [provider]  ondansetron (ZOFRAN ODT) 4 MG disintegrating tablet Take 1 tablet (4 mg total) by mouth every  8 (eight) hours as needed for nausea or vomiting. Patient not taking: Reported on 07/21/2020 02/21/20   Delia Heady, PA-C    Allergies    Morphine and related  Review of Systems   Review of Systems  Constitutional:  Negative for fever.  Respiratory:  Negative for shortness of breath.   Cardiovascular:  Positive for leg swelling. Negative for chest pain.  Musculoskeletal:  Positive for myalgias.  Skin:  Negative for color change.  All other systems reviewed and are negative.  Physical Exam Updated Vital Signs BP 124/75   Pulse (!) 53   Temp 98.7 F (37.1 C) (Oral)   Resp 16   Ht 5\' 1"  (1.549 m)   Wt 108.9 kg   LMP 05/05/2019 (Approximate)   SpO2 96%   BMI 45.35 kg/m   Physical Exam Vitals and nursing note reviewed.   Constitutional:      Appearance: She is well-developed. She is obese.  HENT:     Head: Normocephalic and atraumatic.     Right Ear: External ear normal.     Left Ear: External ear normal.     Nose: Nose normal.  Eyes:     General:        Right eye: No discharge.        Left eye: No discharge.  Cardiovascular:     Rate and Rhythm: Normal rate and regular rhythm.     Pulses:          Dorsalis pedis pulses are 2+ on the right side and 2+ on the left side.  Pulmonary:     Effort: Pulmonary effort is normal.  Abdominal:     General: There is no distension.  Musculoskeletal:     Right knee: Decreased range of motion. Tenderness present.     Comments: Diffuse tenderness to the anterior and posterior knee.  There is questionable swelling.  There is no increased warmth, redness, or large effusion.  Skin:    General: Skin is warm and dry.  Neurological:     Mental Status: She is alert.  Psychiatric:        Mood and Affect: Mood is not anxious.    ED Results / Procedures / Treatments   Labs (all labs ordered are listed, but only abnormal results are displayed) Labs Reviewed - No data to display  EKG None  Radiology US Venous Img Lower Unilateral Right  Result Date: 01/04/2021 CLINICAL DATA:  Right lower extremity swelling.  Rule out DVT. EXAM: Right LOWER EXTREMITY VENOUS DOPPLER ULTRASOUND TECHNIQUE: Gray-scale sonography with compression, as well as color and duplex ultrasound, were performed to evaluate the deep venous system(s) from the level of the common femoral vein through the popliteal and proximal calf veins. COMPARISON:  None. FINDINGS: VENOUS Normal compressibility of the common femoral, superficial femoral, and popliteal veins, as well as the visualized calf veins. Visualized portions of profunda femoral vein and great saphenous vein unremarkable. No filling defects to suggest DVT on grayscale or color Doppler imaging. Doppler waveforms show normal direction of venous  flow, normal respiratory plasticity and response to augmentation. Limited views of the contralateral common femoral vein are unremarkable. OTHER None. Limitations: none IMPRESSION: Negative. Electronically Signed   By: Anner Crete M.D.   On: 01/04/2021 22:22   DG Knee Complete 4 Views Right  Result Date: 01/04/2021 CLINICAL DATA:  Right knee pain. EXAM: RIGHT KNEE - COMPLETE 4+ VIEW COMPARISON:  None. FINDINGS: There is no acute fracture or dislocation. The bones are  well mineralized. No arthritic changes. No joint effusion. The soft tissues are unremarkable. IMPRESSION: Negative. Electronically Signed   By: Anner Crete M.D.   On: 01/04/2021 23:17    Procedures Procedures   Medications Ordered in ED Medications  acetaminophen (TYLENOL) tablet 1,000 mg (1,000 mg Oral Given 01/04/21 2237)  ibuprofen (ADVIL) tablet 600 mg (600 mg Oral Given 01/04/21 2310)    ED Course  I have reviewed the triage vital signs and the nursing notes.  Pertinent labs & imaging results that were available during my care of the patient were reviewed by me and considered in my medical decision making (see chart for details).    MDM Rules/Calculators/A&P                           Pain seems to be more knee than thigh/calf.  Either way there is no DVT and there is no obvious joint effusion.  While she does have pain with range of motion, I highly doubt a septic joint.  At this point I have recommended scheduled NSAIDs, Tylenol, Ace wrap, and follow-up with orthopedics.  She is comfortable with this plan.  She was given return precautions. Final Clinical Impression(s) / ED Diagnoses Final diagnoses:  Acute pain of right knee    Rx / DC Orders ED Discharge Orders     None        Sherwood Gambler, MD 01/04/21 [phone]

## 2021-01-09 ENCOUNTER — Other Ambulatory Visit: Payer: Self-pay

## 2021-01-09 ENCOUNTER — Encounter (HOSPITAL_BASED_OUTPATIENT_CLINIC_OR_DEPARTMENT_OTHER): Payer: Self-pay | Admitting: Emergency Medicine

## 2021-01-09 ENCOUNTER — Emergency Department (HOSPITAL_BASED_OUTPATIENT_CLINIC_OR_DEPARTMENT_OTHER): Payer: Medicaid Other | Admitting: Radiology

## 2021-01-09 ENCOUNTER — Emergency Department (HOSPITAL_BASED_OUTPATIENT_CLINIC_OR_DEPARTMENT_OTHER)
Admission: EM | Admit: 2021-01-09 | Discharge: 2021-01-09 | Disposition: A | Discharge status: Home / Self Care | Payer: Medicaid Other | Attending: Emergency Medicine | Admitting: Emergency Medicine

## 2021-01-09 DIAGNOSIS — E119 Type 2 diabetes mellitus without complications: Secondary | ICD-10-CM | POA: Diagnosis not present

## 2021-01-09 DIAGNOSIS — Z79899 Other long term (current) drug therapy: Secondary | ICD-10-CM | POA: Insufficient documentation

## 2021-01-09 DIAGNOSIS — I10 Essential (primary) hypertension: Secondary | ICD-10-CM | POA: Diagnosis not present

## 2021-01-09 DIAGNOSIS — J45909 Unspecified asthma, uncomplicated: Secondary | ICD-10-CM | POA: Diagnosis not present

## 2021-01-09 DIAGNOSIS — R059 Cough, unspecified: Secondary | ICD-10-CM | POA: Diagnosis not present

## 2021-01-09 DIAGNOSIS — R112 Nausea with vomiting, unspecified: Secondary | ICD-10-CM | POA: Diagnosis not present

## 2021-01-09 DIAGNOSIS — Z20822 Contact with and (suspected) exposure to covid-19: Secondary | ICD-10-CM | POA: Insufficient documentation

## 2021-01-09 LAB — CBC WITH DIFFERENTIAL/PLATELET
Abs Immature Granulocytes: 0.02 10*3/uL (ref 0.00–0.07)
Basophils Absolute: 0 10*3/uL (ref 0.0–0.1)
Basophils Relative: 1 %
Eosinophils Absolute: 0.1 10*3/uL (ref 0.0–0.5)
Eosinophils Relative: 1 %
HCT: 42.3 % (ref 36.0–46.0)
Hemoglobin: 14.1 g/dL (ref 12.0–15.0)
Immature Granulocytes: 0 %
Lymphocytes Relative: 34 %
Lymphs Abs: 1.7 10*3/uL (ref 0.7–4.0)
MCH: 29.6 pg (ref 26.0–34.0)
MCHC: 33.3 g/dL (ref 30.0–36.0)
MCV: 88.7 fL (ref 80.0–100.0)
Monocytes Absolute: 0.7 10*3/uL (ref 0.1–1.0)
Monocytes Relative: 13 %
Neutro Abs: 2.5 10*3/uL (ref 1.7–7.7)
Neutrophils Relative %: 51 %
Platelets: 242 10*3/uL (ref 150–400)
RBC: 4.77 MIL/uL (ref 3.87–5.11)
RDW: 12.3 % (ref 11.5–15.5)
WBC: 5 10*3/uL (ref 4.0–10.5)
nRBC: 0 % (ref 0.0–0.2)

## 2021-01-09 LAB — URINALYSIS, ROUTINE W REFLEX MICROSCOPIC
Bilirubin Urine: NEGATIVE
Glucose, UA: NEGATIVE mg/dL
Hgb urine dipstick: NEGATIVE
Ketones, ur: NEGATIVE mg/dL
Leukocytes,Ua: NEGATIVE
Nitrite: NEGATIVE
Specific Gravity, Urine: 1.012 (ref 1.005–1.030)
pH: 5.5 (ref 5.0–8.0)

## 2021-01-09 LAB — COMPREHENSIVE METABOLIC PANEL
ALT: 32 U/L (ref 0–44)
AST: 22 U/L (ref 15–41)
Albumin: 4.2 g/dL (ref 3.5–5.0)
Alkaline Phosphatase: 66 U/L (ref 38–126)
Anion gap: 9 (ref 5–15)
BUN: 14 mg/dL (ref 6–20)
CO2: 26 mmol/L (ref 22–32)
Calcium: 10.2 mg/dL (ref 8.9–10.3)
Chloride: 103 mmol/L (ref 98–111)
Creatinine, Ser: 1.01 mg/dL — ABNORMAL HIGH (ref 0.44–1.00)
GFR, Estimated: >60 mL/min (ref >60)
Glucose, Bld: 142 mg/dL — ABNORMAL HIGH (ref 70–99)
Potassium: 4.1 mmol/L (ref 3.5–5.1)
Sodium: 138 mmol/L (ref 135–145)
Total Bilirubin: 0.3 mg/dL (ref 0.3–1.2)
Total Protein: 7.4 g/dL (ref 6.5–8.1)

## 2021-01-09 LAB — RESP PANEL BY RT-PCR (FLU A&B, COVID) ARPGX2
Influenza A by PCR: NEGATIVE
Influenza B by PCR: NEGATIVE
SARS Coronavirus 2 by RT PCR: NEGATIVE

## 2021-01-09 LAB — PREGNANCY, URINE: Preg Test, Ur: NEGATIVE

## 2021-01-09 LAB — LIPASE, BLOOD: Lipase: 46 U/L (ref 11–51)

## 2021-01-09 LAB — BRAIN NATRIURETIC PEPTIDE: B Natriuretic Peptide: 12.4 pg/mL (ref 0.0–100.0)

## 2021-01-09 MED ORDER — BENZONATATE 100 MG PO CAPS: 100.0000 mg | ORAL_CAPSULE | Freq: Three times a day (TID) | ORAL | 0 refills | Status: AC

## 2021-01-09 MED ORDER — ONDANSETRON 4 MG PO TBDP
4.0000 mg | ORAL_TABLET | Freq: Once | ORAL | Status: AC
  Administered 2021-01-09: 4 mg via ORAL
  Filled 2021-01-09: qty 1

## 2021-01-09 MED ORDER — GUAIFENESIN ER 600 MG PO TB12: 600.0000 mg | ORAL_TABLET | Freq: Two times a day (BID) | ORAL | 0 refills | Status: DC

## 2021-01-09 MED ORDER — LIDOCAINE VISCOUS HCL 2 % MT SOLN
15.0000 mL | Freq: Once | OROMUCOSAL | Status: AC
  Administered 2021-01-09: 15 mL via ORAL
  Filled 2021-01-09: qty 15

## 2021-01-09 MED ORDER — ALUM & MAG HYDROXIDE-SIMETH 200-200-20 MG/5ML PO SUSP
30.0000 mL | Freq: Once | ORAL | Status: AC
  Administered 2021-01-09: 30 mL via ORAL
  Filled 2021-01-09: qty 30

## 2021-01-09 MED ORDER — DICYCLOMINE HCL 10 MG PO CAPS
10.0000 mg | ORAL_CAPSULE | Freq: Once | ORAL | Status: AC
  Administered 2021-01-09: 10 mg via ORAL
  Filled 2021-01-09: qty 1

## 2021-01-09 MED ORDER — IPRATROPIUM-ALBUTEROL 0.5-2.5 (3) MG/3ML IN SOLN
3.0000 mL | Freq: Once | RESPIRATORY_TRACT | Status: AC
  Administered 2021-01-09: 3 mL via RESPIRATORY_TRACT
  Filled 2021-01-09: qty 3

## 2021-01-09 MED ORDER — ONDANSETRON HCL 4 MG PO TABS: 4.0000 mg | ORAL_TABLET | Freq: Four times a day (QID) | ORAL | 0 refills | Status: DC

## 2021-01-09 MED ORDER — AEROCHAMBER PLUS FLO-VU MISC
1.0000 | Freq: Once | Status: AC
  Administered 2021-01-09: 1
  Filled 2021-01-09: qty 1

## 2021-01-09 NOTE — ED Triage Notes (Signed)
Pt arrives pov, ambulatory to triage c/o vomiting and abdominal edema starting yesterday. Pt also reports hoarseness that started yesterday.

## 2021-01-09 NOTE — Discharge Instructions (Addendum)
You were given a prescription for zofran to help with your nausea. Please take as directed.  Take tessalon for your cough and mucinex for your congestion   Please follow up with your primary care provider within 5-7 days for re-evaluation of your symptoms. If you do not have a primary care provider, information for a healthcare clinic has been provided for you to make arrangements for follow up care. Please return to the emergency department for any new or worsening symptoms.

## 2021-01-09 NOTE — ED Provider Notes (Signed)
[facility]ERG[facility] DEPT Provider Note   CSN: [id number] Arrival date & time: 01/09/21  1949     History Chief Complaint  Patient presents with   Emesis    Elaine Perez is a 35 y.o. female.  HPI   Pt is a 35 y/o female with a h/o asthma, DM, fibroids, HTN, iron deficiency, anemia, sleep apnea, who presents to the ED today for eval of abd bloating/tightness nv. States she is normally on hctz but ran out. Denies ble swelling. Denies diarrhea, constipation. She does have some sob cough and congestion. Denies chest pain.   States mother had heart failure in her 12s.  She has had low grade fevers.  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 07/04/2019   Iron deficiency anemia due to chronic blood loss 08/31/2018   Iron deficiency anemia 04/27/2018   Leiomyoma of uterus, unspecified 06/15/2016   Anemia, deficiency 05/13/2015   DDD (degenerative disc disease), lumbar 05/16/2013   BMI 40.0-44.9, adult ([place]) 12/17/2012   OSA on CPAP 12/17/2012   Depression 05/11/2012   Diabetes mellitus type 2 in obese ([place]) 12/23/2011   Hypertension 12/23/2011   Female pelvic pain 12/23/2011    Past Surgical History:  Procedure Laterality Date   GALLBLADDER SURGERY     REPEAT CESAREAN SECTION       OB History   No obstetric history on file.     Family History  Problem Relation Age of Onset   Breast cancer Other    Heart disease Mother    Lupus Maternal Aunt    Stomach cancer Maternal Aunt    Colon cancer Neg Hx    Esophageal cancer Neg Hx    Rectal cancer Neg Hx     Social History   Tobacco Use   Smoking status: Never    Smokeless tobacco: Never  Vaping Use   Vaping Use: Never used  Substance Use Topics   Alcohol use: Never   Drug use: Never    Home Medications Prior to Admission medications   Medication Sig Start Date End Date Taking? Authorizing Provider  benzonatate (TESSALON) 100 MG capsule Take 1 capsule (100 mg total) by mouth every 8 (eight) hours for 5 days. 01/09/21 01/14/21 Yes Donney Caraveo S, PA-C  ondansetron (ZOFRAN) 4 MG tablet Take 1 tablet (4 mg total) by mouth every 6 (six) hours. 01/09/21  Yes Kaula Klenke S, PA-C  carvedilol (COREG) 6.25 MG tablet Take 6.25 mg by mouth 2 (two) times daily. 05/16/20   [provider]  Fluticasone-Salmeterol (ADVAIR) 250-50 MCG/DOSE AEPB Inhale 1 puff into the lungs daily.  05/23/18   [provider]  guaiFENesin (MUCINEX) 600 MG 12 hr tablet Take 1 tablet (600 mg total) by mouth 2 (two) times daily. 01/09/21   Jamori Biggar S, PA-C  hydrochlorothiazide (HYDRODIURIL) 25 MG tablet Take 1 tablet (25 mg total) by mouth daily. 07/21/20   Daleen Bo, MD  Multiple Vitamin (MULTIVITAMIN WITH MINERALS) TABS tablet Take 1 tablet by mouth daily.    [provider]  naproxen (NAPROSYN) 500 MG tablet Take 1 tablet (500 mg total)  by mouth 2 (two) times daily. Patient not taking: Reported on 07/21/2020 02/21/20   Delia Heady, PA-C  naproxen sodium (ALEVE) 220 MG tablet Take 440 mg by mouth 2 (two) times daily as needed (pain.headache).    [provider]  ondansetron (ZOFRAN ODT) 4 MG disintegrating tablet Take 1 tablet (4 mg total) by mouth every 8 (eight) hours as needed for nausea or vomiting. Patient not taking: Reported on 07/21/2020 02/21/20   Delia Heady, PA-C    Allergies    Morphine and related  Review of Systems   Review of Systems  Constitutional:  Positive for fever.  HENT:  Positive for congestion. Negative for ear pain and sore throat.   Eyes:  Negative for visual disturbance.  Respiratory:  Positive for cough and  shortness of breath. Negative for choking.   Cardiovascular:  Negative for chest pain.  Gastrointestinal:  Positive for abdominal distention, abdominal pain, nausea and vomiting. Negative for constipation and diarrhea.  Genitourinary:  Negative for dysuria and hematuria.  Musculoskeletal:  Negative for back pain.  Skin:  Negative for rash.  Neurological:  Negative for seizures and syncope.  All other systems reviewed and are negative.  Physical Exam Updated Vital Signs BP (!) 169/97   Pulse 81   Temp 99.3 F (37.4 C) (Oral)   Resp 17   Ht 5\' 1"  (1.549 m)   Wt 106.6 kg   LMP 05/05/2019 (Approximate)   SpO2 97%   BMI 44.40 kg/m   Physical Exam Vitals and nursing note reviewed.  Constitutional:      General: She is not in acute distress.    Appearance: She is well-developed.  HENT:     Head: Normocephalic and atraumatic.  Eyes:     Conjunctiva/sclera: Conjunctivae normal.  Cardiovascular:     Rate and Rhythm: Normal rate and regular rhythm.     Heart sounds: Normal heart sounds. No murmur heard. Pulmonary:     Effort: Pulmonary effort is normal. No respiratory distress.     Breath sounds: Normal breath sounds. No wheezing, rhonchi or rales.  Abdominal:     General: Bowel sounds are normal.     Palpations: Abdomen is soft.     Tenderness: There is abdominal tenderness (mild bilat upper abd ttp).     Comments: No edema noted to the abdomen  Musculoskeletal:        General: No swelling.     Cervical back: Neck supple.     Right lower leg: No edema.     Left lower leg: No edema.  Skin:    General: Skin is warm and dry.     Capillary Refill: Capillary refill takes less than 2 seconds.  Neurological:     Mental Status: She is alert.  Psychiatric:        Mood and Affect: Mood normal.    ED Results / Procedures / Treatments   Labs (all labs ordered are listed, but only abnormal results are displayed) Labs Reviewed  COMPREHENSIVE METABOLIC PANEL - Abnormal; Notable  for the following components:      Result Value   Glucose, Bld 142 (*)    Creatinine, Ser 1.01 (*)    All other components within normal limits  URINALYSIS, ROUTINE W REFLEX MICROSCOPIC - Abnormal; Notable for the following components:   Color, Urine COLORLESS (*)    Protein, ur TRACE (*)    All other components within normal limits  RESP PANEL BY RT-PCR (FLU A&B, COVID) ARPGX2  CBC WITH  DIFFERENTIAL/PLATELET  LIPASE, BLOOD  BRAIN NATRIURETIC PEPTIDE  PREGNANCY, URINE    EKG EKG Interpretation  Date/Time:  Saturday January 09 2021 21:28:43 EST Ventricular Rate:  83 PR Interval:  129 QRS Duration: 83 QT Interval:  382 QTC Calculation: 449 R Axis:   16 Text Interpretation: Sinus rhythm Probable anterior infarct, old No significant change since prior 6/22 Confirmed by Aletta Edouard [id number]) on 01/09/2021 9:30:38 PM  Radiology DG Chest 2 View  Result Date: 01/09/2021 CLINICAL DATA:  Shortness of breath, hoarseness EXAM: CHEST - 2 VIEW COMPARISON:  None. FINDINGS: Cardiac contour is at the upper limit of normal, given low lung volumes. Normal mediastinal silhouette. No focal pulmonary opacity. No pleural effusion. No acute osseous abnormality. IMPRESSION: No active cardiopulmonary disease. Electronically Signed   By: Merilyn Baba M.D.   On: 01/09/2021 20:52    Procedures Procedures   Medications Ordered in ED Medications  ondansetron (ZOFRAN-ODT) disintegrating tablet 4 mg (4 mg Oral Given 01/09/21 2046)  dicyclomine (BENTYL) capsule 10 mg (10 mg Oral Given 01/09/21 2121)  alum & mag hydroxide-simeth (MAALOX/MYLANTA) 200-200-20 MG/5ML suspension 30 mL (30 mLs Oral Given 01/09/21 2121)    And  lidocaine (XYLOCAINE) 2 % viscous mouth solution 15 mL (15 mLs Oral Given 01/09/21 2121)  ipratropium-albuterol (DUONEB) 0.5-2.5 (3) MG/3ML nebulizer solution 3 mL (3 mLs Nebulization Given 01/09/21 2139)  aerochamber plus with mask device 1 each (1 each Other Given 01/09/21 2153)     ED Course  I have reviewed the triage vital signs and the nursing notes.  Pertinent labs & imaging results that were available during my care of the patient were reviewed by me and considered in my medical decision making (see chart for details).    MDM Rules/Calculators/A&P                          35 y/o female presents for abd bloating, nv. Also c/o cough, sob  Reviewed/interpreted labs CBC unremarkable CMP unremarkable Lipase neg UA neg for UTI Urine preg neg BNP neg COVID/flu neg  EKG - Sinus rhythm Probable anterior infarct, old No significant change since prior 6/22   Reviewed/interpreted imaging CXR - neg for pna, pulmonary edema  Pt was given zofran, bentyl and a GI cocktail. On reassessment she feels improved.  She no longer has any abdominal discomfort or nausea.  She is tolerating p.o.  Her COVID and flu test are negative her chest x-ray did not show any evidence of pneumonia.  She was given a DuoNeb here as well as an albuterol inhaler and she feels improved.  I suspect she likely has a viral illness.  Feel she would benefit from symptomatic treatment.  Will give Rx for Zofran for nausea and vomiting.  We will also prescribe Mucinex and Tessalon for her cough.  Doubt other emergent cause of symptoms at this time.  Feel she is appropriate DC home with close follow-up and strict return precautions.  She voices understanding of the plan and reasons to return.  All questions answered.  Patient stable for discharge.  Final Clinical Impression(s) / ED Diagnoses Final diagnoses:  Nausea and vomiting, unspecified vomiting type  Cough, unspecified type    Rx / DC Orders ED Discharge Orders          Ordered    ondansetron (ZOFRAN) 4 MG tablet  Every 6 hours        01/09/21 2159    benzonatate (TESSALON) 100 MG capsule  Every 8 hours        01/09/21 2159    guaiFENesin (MUCINEX) 600 MG 12 hr tablet  2 times daily,   Status:  Discontinued        01/09/21 2159     guaiFENesin (MUCINEX) 600 MG 12 hr tablet  2 times daily        01/09/21 2159             Bishop [place] 01/09/21 2159    Hayden Rasmussen, MD 01/10/21 1030

## 2021-01-09 NOTE — ED Notes (Signed)
RT educated pt on the proper use of MDI w/spacer. Pt also states she has not been to see a pulmonologist for her asthma. RT suggested pt seek a referral to pulmonology for a recent and up to date PFT for better management of her asthma and symptoms. Pt expresses understanding and able to perform w/out difficulty.

## 2021-04-19 ENCOUNTER — Encounter: Payer: Self-pay | Admitting: Hematology and Oncology

## 2021-05-27 ENCOUNTER — Other Ambulatory Visit: Payer: Self-pay

## 2021-05-27 ENCOUNTER — Encounter (HOSPITAL_BASED_OUTPATIENT_CLINIC_OR_DEPARTMENT_OTHER): Payer: Self-pay

## 2021-05-27 DIAGNOSIS — I10 Essential (primary) hypertension: Secondary | ICD-10-CM | POA: Diagnosis not present

## 2021-05-27 DIAGNOSIS — Z79899 Other long term (current) drug therapy: Secondary | ICD-10-CM | POA: Insufficient documentation

## 2021-05-27 DIAGNOSIS — E119 Type 2 diabetes mellitus without complications: Secondary | ICD-10-CM | POA: Diagnosis not present

## 2021-05-27 DIAGNOSIS — R609 Edema, unspecified: Secondary | ICD-10-CM | POA: Diagnosis not present

## 2021-05-27 LAB — URINALYSIS, ROUTINE W REFLEX MICROSCOPIC
Bilirubin Urine: NEGATIVE
Glucose, UA: NEGATIVE mg/dL
Hgb urine dipstick: NEGATIVE
Ketones, ur: NEGATIVE mg/dL
Leukocytes,Ua: NEGATIVE
Nitrite: NEGATIVE
Protein, ur: 30 mg/dL — AB
Specific Gravity, Urine: 1.025 (ref 1.005–1.030)
pH: 5.5 (ref 5.0–8.0)

## 2021-05-27 LAB — COMPREHENSIVE METABOLIC PANEL
ALT: 30 U/L (ref 0–44)
AST: 18 U/L (ref 15–41)
Albumin: 4.2 g/dL (ref 3.5–5.0)
Alkaline Phosphatase: 68 U/L (ref 38–126)
Anion gap: 11 (ref 5–15)
BUN: 14 mg/dL (ref 6–20)
CO2: 23 mmol/L (ref 22–32)
Calcium: 9.7 mg/dL (ref 8.9–10.3)
Chloride: 103 mmol/L (ref 98–111)
Creatinine, Ser: 0.87 mg/dL (ref 0.44–1.00)
GFR, Estimated: >60 mL/min (ref >60)
Glucose, Bld: 227 mg/dL — ABNORMAL HIGH (ref 70–99)
Potassium: 3.5 mmol/L (ref 3.5–5.1)
Sodium: 137 mmol/L (ref 135–145)
Total Bilirubin: 0.3 mg/dL (ref 0.3–1.2)
Total Protein: 7.5 g/dL (ref 6.5–8.1)

## 2021-05-27 LAB — CBC
HCT: 43.6 % (ref 36.0–46.0)
Hemoglobin: 14.5 g/dL (ref 12.0–15.0)
MCH: 29.6 pg (ref 26.0–34.0)
MCHC: 33.3 g/dL (ref 30.0–36.0)
MCV: 89 fL (ref 80.0–100.0)
Platelets: 283 10*3/uL (ref 150–400)
RBC: 4.9 MIL/uL (ref 3.87–5.11)
RDW: 12.7 % (ref 11.5–15.5)
WBC: 8.3 10*3/uL (ref 4.0–10.5)
nRBC: 0 % (ref 0.0–0.2)

## 2021-05-27 LAB — PREGNANCY, URINE: Preg Test, Ur: NEGATIVE

## 2021-05-27 LAB — LIPASE, BLOOD: Lipase: 49 U/L (ref 11–51)

## 2021-05-27 NOTE — ED Triage Notes (Signed)
Pt report having abdomen pain and lower leg pain. States "I am swelling because of my high blood pressure." ? ?Pt states she has had issues with her BP being high lately. ?

## 2021-05-28 ENCOUNTER — Emergency Department (HOSPITAL_BASED_OUTPATIENT_CLINIC_OR_DEPARTMENT_OTHER)
Admission: EM | Admit: 2021-05-28 | Discharge: 2021-05-28 | Disposition: A | Discharge status: Home / Self Care | Payer: Medicaid Other | Attending: Emergency Medicine | Admitting: Emergency Medicine

## 2021-05-28 DIAGNOSIS — R609 Edema, unspecified: Secondary | ICD-10-CM

## 2021-05-28 MED ORDER — FUROSEMIDE 20 MG PO TABS: 20.0000 mg | ORAL_TABLET | Freq: Every day | ORAL | 0 refills | Status: AC

## 2021-05-28 NOTE — ED Provider Notes (Signed)
?[facility] EMERGENC[facility] DEPT ?Provider Note ? ? ?CSN: [id number] ?Arrival date & time: 05/27/21  2100 ? ?  ? ?History ? ?No chief complaint on file. ? ? ?Elaine Perez is a 36 y.o. female. ? ?Patient is a 36 year old female with past medical history of hypertension, type 2 diabetes, anemia, and obesity.  Patient presenting today with complaints of fluid retention.  She reports swelling to her hands and feet, but denies any chest pain, shortness of breath, fevers, or chills.  Patient takes hydrochlorothiazide and has been compliant with this.  She has no history of congestive heart failure, but is on medication for her blood pressure. ? ?The history is provided by the patient.  ? ?  ? ?Home Medications ?Prior to Admission medications   ?Medication Sig Start Date End Date Taking? Authorizing Provider  ?carvedilol (COREG) 6.25 MG tablet Take 6.25 mg by mouth 2 (two) times daily. 05/16/20   [provider]  ?Fluticasone-Salmeterol (ADVAIR) 250-50 MCG/DOSE AEPB Inhale 1 puff into the lungs daily.  05/23/18   [provider]  ?guaiFENesin (MUCINEX) 600 MG 12 hr tablet Take 1 tablet (600 mg total) by mouth 2 (two) times daily. 01/09/21   Couture, Cortni S, PA-C  ?hydrochlorothiazide (HYDRODIURIL) 25 MG tablet Take 1 tablet (25 mg total) by mouth daily. 07/21/20   Daleen Bo, MD  ?Multiple Vitamin (MULTIVITAMIN WITH MINERALS) TABS tablet Take 1 tablet by mouth daily.    [provider]  ?naproxen (NAPROSYN) 500 MG tablet Take 1 tablet (500 mg total) by mouth 2 (two) times daily. ?Patient not taking: Reported on 07/21/2020 02/21/20   Delia Heady, PA-C  ?naproxen sodium (ALEVE) 220 MG tablet Take 440 mg by mouth 2 (two) times daily as needed (pain.headache).    [provider]  ?ondansetron (ZOFRAN ODT) 4 MG disintegrating tablet Take 1 tablet (4 mg total) by mouth every 8 (eight) hours as needed for nausea or vomiting. ?Patient not taking: Reported on 07/21/2020 02/21/20   Delia Heady,  PA-C  ?ondansetron (ZOFRAN) 4 MG tablet Take 1 tablet (4 mg total) by mouth every 6 (six) hours. 01/09/21   Couture, Cortni S, PA-C  ?   ? ?Allergies    ?Morphine and related   ? ?Review of Systems   ?Review of Systems  ?All other systems reviewed and are negative. ? ?Physical Exam ?Updated Vital Signs ?BP (!) 150/87   Pulse 65   Temp 98 ?F (36.7 ?C) (Oral)   Resp (!) 21   Ht '5\' 1"'$  (1.549 m)   Wt 114.6 kg   LMP 05/05/2019 (Approximate)   SpO2 98%   BMI 47.74 kg/m?  ?Physical Exam ?Vitals and nursing note reviewed.  ?Constitutional:   ?   General: She is not in acute distress. ?   Appearance: She is well-developed. She is not diaphoretic.  ?HENT:  ?   Head: Normocephalic and atraumatic.  ?Cardiovascular:  ?   Rate and Rhythm: Normal rate and regular rhythm.  ?   Heart sounds: No murmur heard. ?  No friction rub. No gallop.  ?Pulmonary:  ?   Effort: Pulmonary effort is normal. No respiratory distress.  ?   Breath sounds: Normal breath sounds. No wheezing.  ?Abdominal:  ?   General: Bowel sounds are normal. There is no distension.  ?   Palpations: Abdomen is soft.  ?   Tenderness: There is no abdominal tenderness.  ?Musculoskeletal:     ?   General: Normal range of motion.  ?  Cervical back: Normal range of motion and neck supple.  ?   Right lower leg: Edema present.  ?   Left lower leg: Edema present.  ?   Comments: There is 1+, nonpitting edema of the bilateral lower extremities.  DP pulses are easily palpable bilaterally.  ?Skin: ?   General: Skin is warm and dry.  ?Neurological:  ?   General: No focal deficit present.  ?   Mental Status: She is alert and oriented to person, place, and time.  ? ? ?ED Results / Procedures / Treatments   ?Labs ?(all labs ordered are listed, but only abnormal results are displayed) ?Labs Reviewed  ?COMPREHENSIVE METABOLIC PANEL - Abnormal; Notable for the following components:  ?    Result Value  ? Glucose, Bld 227 (*)   ? All other components within normal limits   ?URINALYSIS, ROUTINE W REFLEX MICROSCOPIC - Abnormal; Notable for the following components:  ? Protein, ur 30 (*)   ? All other components within normal limits  ?LIPASE, BLOOD  ?CBC  ?PREGNANCY, URINE  ? ? ?EKG ?EKG Interpretation ? ?Date/Time:  Thursday May 27 2021 21:29:01 EDT ?Ventricular Rate:  86 ?PR Interval:  128 ?QRS Duration: 74 ?QT Interval:  390 ?QTC Calculation: 466 ?R Axis:   40 ?Text Interpretation: Normal sinus rhythm Nonspecific T wave abnormality Abnormal ECG When compared with ECG of 21-Dec-2004 04:59, No significant changes noted Confirmed by Veryl Speak [id number]) on 05/28/2021 1:12:43 AM ? ?Radiology ?No results found. ? ?Procedures ?Procedures  ? ? ?Medications Ordered in ED ?Medications - No data to display ? ?ED Course/ Medical Decision Making/ A&P ? ?Patient presenting here with complaints of swelling in her hands and feet.  Patient's work-up shows normal renal function, unremarkable electrolytes, normal CBC, and negative pregnancy test.  Patient's blood pressure is somewhat elevated here in the ER upon presentation, however has improved without intervention.  When I examined her, her blood pressure was 150/87. ? ?At this point, I do not feel as though she requires admission.  Her oxygen saturations are normal and I doubt congestive heart failure.  Patient does have some nonpitting edema and I will prescribe a small quantity of Lasix.  She is to follow-up her blood pressures at home and follow-up with primary doctor next week. ? ?Final Clinical Impression(s) / ED Diagnoses ?Final diagnoses:  ?None  ? ? ?Rx / DC Orders ?ED Discharge Orders   ? ? None  ? ?  ? ? ?  ?Veryl Speak, MD ?05/28/21 0123 ? ?

## 2021-05-28 NOTE — Discharge Instructions (Signed)
Begin taking Lasix as prescribed. ? ?Keep a record of your blood pressures at home and follow this up with your primary doctor next week. ? ?Return to the emergency department in the meantime if you develop severe chest pain, difficulty breathing, or other new and concerning symptoms. ?

## 2021-05-28 NOTE — ED Notes (Signed)
Discharge instructions discussed with pt. Pt verbalized understanding with no questions at this time. Pt to go home with friend at bedside ?

## 2021-06-21 ENCOUNTER — Encounter (HOSPITAL_BASED_OUTPATIENT_CLINIC_OR_DEPARTMENT_OTHER): Payer: Self-pay | Admitting: Obstetrics and Gynecology

## 2021-06-21 ENCOUNTER — Emergency Department (HOSPITAL_BASED_OUTPATIENT_CLINIC_OR_DEPARTMENT_OTHER)
Admission: EM | Admit: 2021-06-21 | Discharge: 2021-06-21 | Disposition: A | Discharge status: Home / Self Care | Payer: Medicaid Other | Attending: Emergency Medicine | Admitting: Emergency Medicine

## 2021-06-21 ENCOUNTER — Emergency Department (HOSPITAL_BASED_OUTPATIENT_CLINIC_OR_DEPARTMENT_OTHER): Payer: Medicaid Other

## 2021-06-21 ENCOUNTER — Other Ambulatory Visit: Payer: Self-pay

## 2021-06-21 DIAGNOSIS — R519 Headache, unspecified: Secondary | ICD-10-CM | POA: Diagnosis not present

## 2021-06-21 DIAGNOSIS — H538 Other visual disturbances: Secondary | ICD-10-CM | POA: Insufficient documentation

## 2021-06-21 DIAGNOSIS — R102 Pelvic and perineal pain: Secondary | ICD-10-CM | POA: Diagnosis present

## 2021-06-21 LAB — CBC WITH DIFFERENTIAL/PLATELET
Abs Immature Granulocytes: 0.02 10*3/uL (ref 0.00–0.07)
Basophils Absolute: 0.1 10*3/uL (ref 0.0–0.1)
Basophils Relative: 1 %
Eosinophils Absolute: 0.2 10*3/uL (ref 0.0–0.5)
Eosinophils Relative: 3 %
HCT: 44.7 % (ref 36.0–46.0)
Hemoglobin: 14.6 g/dL (ref 12.0–15.0)
Immature Granulocytes: 0 %
Lymphocytes Relative: 39 %
Lymphs Abs: 2.7 10*3/uL (ref 0.7–4.0)
MCH: 28.8 pg (ref 26.0–34.0)
MCHC: 32.7 g/dL (ref 30.0–36.0)
MCV: 88.2 fL (ref 80.0–100.0)
Monocytes Absolute: 0.6 10*3/uL (ref 0.1–1.0)
Monocytes Relative: 9 %
Neutro Abs: 3.4 10*3/uL (ref 1.7–7.7)
Neutrophils Relative %: 48 %
Platelets: 272 10*3/uL (ref 150–400)
RBC: 5.07 MIL/uL (ref 3.87–5.11)
RDW: 12.4 % (ref 11.5–15.5)
WBC: 7 10*3/uL (ref 4.0–10.5)
nRBC: 0 % (ref 0.0–0.2)

## 2021-06-21 LAB — URINALYSIS, ROUTINE W REFLEX MICROSCOPIC
Bilirubin Urine: NEGATIVE
Glucose, UA: NEGATIVE mg/dL
Hgb urine dipstick: NEGATIVE
Ketones, ur: NEGATIVE mg/dL
Leukocytes,Ua: NEGATIVE
Nitrite: NEGATIVE
Protein, ur: 30 mg/dL — AB
Specific Gravity, Urine: 1.027 (ref 1.005–1.030)
pH: 6 (ref 5.0–8.0)

## 2021-06-21 LAB — COMPREHENSIVE METABOLIC PANEL
ALT: 28 U/L (ref 0–44)
AST: 15 U/L (ref 15–41)
Albumin: 4 g/dL (ref 3.5–5.0)
Alkaline Phosphatase: 65 U/L (ref 38–126)
Anion gap: 10 (ref 5–15)
BUN: 16 mg/dL (ref 6–20)
CO2: 26 mmol/L (ref 22–32)
Calcium: 9.5 mg/dL (ref 8.9–10.3)
Chloride: 102 mmol/L (ref 98–111)
Creatinine, Ser: 0.66 mg/dL (ref 0.44–1.00)
GFR, Estimated: >60 mL/min (ref >60)
Glucose, Bld: 143 mg/dL — ABNORMAL HIGH (ref 70–99)
Potassium: 3.8 mmol/L (ref 3.5–5.1)
Sodium: 138 mmol/L (ref 135–145)
Total Bilirubin: 0.4 mg/dL (ref 0.3–1.2)
Total Protein: 7.3 g/dL (ref 6.5–8.1)

## 2021-06-21 LAB — PREGNANCY, URINE: Preg Test, Ur: NEGATIVE

## 2021-06-21 MED ORDER — PROCHLORPERAZINE EDISYLATE 10 MG/2ML IJ SOLN
10.0000 mg | Freq: Once | INTRAMUSCULAR | Status: AC
  Administered 2021-06-21: 10 mg via INTRAVENOUS
  Filled 2021-06-21: qty 2

## 2021-06-21 MED ORDER — DIPHENHYDRAMINE HCL 50 MG/ML IJ SOLN
25.0000 mg | Freq: Once | INTRAMUSCULAR | Status: AC
  Administered 2021-06-21: 25 mg via INTRAVENOUS
  Filled 2021-06-21: qty 1

## 2021-06-21 MED ORDER — DIPHENHYDRAMINE HCL 50 MG/ML IJ SOLN
25.0000 mg | Freq: Once | INTRAMUSCULAR | Status: AC
Start: 2021-06-21 — End: 2021-06-21
  Administered 2021-06-21: 25 mg via INTRAVENOUS
  Filled 2021-06-21: qty 1

## 2021-06-21 NOTE — ED Notes (Signed)
Patient transported to Ultrasound 

## 2021-06-21 NOTE — ED Provider Notes (Signed)
?[facility] ?Provider Note ? ? ?CSN: [id number] ?Arrival date & time: 06/21/21  0935 ? ?  ? ?History ? ?Chief Complaint  ?Patient presents with  ? Abdominal Pain  ? ? ?Elaine Perez is a 36 y.o. female. ? ?HPI ? ?  ?36 year old female comes in with multiple complaints. ? ?Patient indicates that she has been having lower quadrant/suprapubic pelvic pain for the last 5 days.  She has known history of fibroids per our record.  Patient denies any associated burning with urination, blood in the urine, vaginal discharge or bleeding, urinary frequency.  She also denies any constipation or diarrhea. ? ?Additionally, patient indicates that she has had headaches.  Headaches have been constant, nagging pain that are worse at night or when she wakes up.  She does not think she has been able to sleep well because of these headaches.  She has taken OTC medication with minimal relief.  Patient also has noted that she is having some blurry vision which is new for her. ? ?Home Medications ?Prior to Admission medications   ?Medication Sig Start Date End Date Taking? Authorizing Provider  ?carvedilol (COREG) 6.25 MG tablet Take 6.25 mg by mouth 2 (two) times daily. 05/16/20   [provider]  ?Fluticasone-Salmeterol (ADVAIR) 250-50 MCG/DOSE AEPB Inhale 1 puff into the lungs daily.  05/23/18   [provider]  ?furosemide (LASIX) 20 MG tablet Take 1 tablet (20 mg total) by mouth daily. 05/28/21   Veryl Speak, MD  ?guaiFENesin (MUCINEX) 600 MG 12 hr tablet Take 1 tablet (600 mg total) by mouth 2 (two) times daily. 01/09/21   Couture, Cortni S, PA-C  ?hydrochlorothiazide (HYDRODIURIL) 25 MG tablet Take 1 tablet (25 mg total) by mouth daily. 07/21/20   Daleen Bo, MD  ?Multiple Vitamin (MULTIVITAMIN WITH MINERALS) TABS tablet Take 1 tablet by mouth daily.    [provider]  ?naproxen (NAPROSYN) 500 MG tablet Take 1 tablet (500 mg total) by mouth 2 (two) times daily. ?Patient not taking:  Reported on 07/21/2020 02/21/20   Delia Heady, PA-C  ?naproxen sodium (ALEVE) 220 MG tablet Take 440 mg by mouth 2 (two) times daily as needed (pain.headache).    [provider]  ?ondansetron (ZOFRAN ODT) 4 MG disintegrating tablet Take 1 tablet (4 mg total) by mouth every 8 (eight) hours as needed for nausea or vomiting. ?Patient not taking: Reported on 07/21/2020 02/21/20   Delia Heady, PA-C  ?ondansetron (ZOFRAN) 4 MG tablet Take 1 tablet (4 mg total) by mouth every 6 (six) hours. 01/09/21   Couture, Cortni S, PA-C  ?   ? ?Allergies    ?Morphine and related   ? ?Review of Systems   ?Review of Systems  ?All other systems reviewed and are negative. ? ?Physical Exam ?Updated Vital Signs ?BP (!) 141/90 (BP Location: Right Arm)   Pulse 66   Temp 98.1 ?F (36.7 ?C) (Oral)   Resp 15   Ht '5\' 1"'$  (1.549 m)   Wt 119.7 kg   LMP 05/05/2019 (Approximate)   SpO2 100%   BMI 49.88 kg/m?  ?Physical Exam ?Vitals and nursing note reviewed.  ?Constitutional:   ?   Appearance: She is well-developed.  ?HENT:  ?   Head: Atraumatic.  ?Eyes:  ?   Extraocular Movements: Extraocular movements intact.  ?   Pupils: Pupils are equal, round, and reactive to light.  ?   Comments: No nystagmus, no visual field defects  ?Cardiovascular:  ?   Rate and Rhythm:  Normal rate.  ?Pulmonary:  ?   Effort: Pulmonary effort is normal.  ?Musculoskeletal:  ?   Cervical back: Normal range of motion and neck supple.  ?Skin: ?   General: Skin is warm and dry.  ?Neurological:  ?   Mental Status: She is alert and oriented to person, place, and time.  ?   Cranial Nerves: No cranial nerve deficit.  ?   Motor: No weakness.  ? ? ?ED Results / Procedures / Treatments   ?Labs ?(all labs ordered are listed, but only abnormal results are displayed) ?Labs Reviewed  ?URINALYSIS, ROUTINE W REFLEX MICROSCOPIC - Abnormal; Notable for the following components:  ?    Result Value  ? Protein, ur 30 (*)   ? All other components within normal limits  ?COMPREHENSIVE  METABOLIC PANEL - Abnormal; Notable for the following components:  ? Glucose, Bld 143 (*)   ? All other components within normal limits  ?CSF CULTURE W GRAM STAIN  ?CBC WITH DIFFERENTIAL/PLATELET  ?PREGNANCY, URINE  ?CSF CELL COUNT WITH DIFFERENTIAL  ?PROTEIN AND GLUCOSE, CSF  ? ? ?EKG ?None ? ?Radiology ?CT Head Wo Contrast ? ?Result Date: 06/21/2021 ?CLINICAL DATA:  Headaches x 3-4 days EXAM: CT HEAD WITHOUT CONTRAST TECHNIQUE: Contiguous axial images were obtained from the base of the skull through the vertex without intravenous contrast. RADIATION DOSE REDUCTION: This exam was performed according to the departmental dose-optimization program which includes automated exposure control, adjustment of the mA and/or kV according to patient size and/or use of iterative reconstruction technique. COMPARISON:  07/21/2020 FINDINGS: Brain: No acute intracranial findings are seen. Ventricles are not dilated. There are no signs of bleeding within the cranium. Vascular: Unremarkable. Skull: Unremarkable. Sinuses/Orbits: Unremarkable. Other: No significant interval changes are noted. IMPRESSION: No acute intracranial findings are seen in noncontrast CT brain. Electronically Signed   By: Elmer Picker M.D.   On: 06/21/2021 10:50  ? ?US PELVIC COMPLETE WITH TRANSVAGINAL ? ?Result Date: 06/21/2021 ?CLINICAL DATA:  Suprapubic pain for 5 days. EXAM: TRANSABDOMINAL AND TRANSVAGINAL ULTRASOUND OF PELVIS TECHNIQUE: Both transabdominal and transvaginal ultrasound examinations of the pelvis were performed. Transabdominal technique was performed for global imaging of the pelvis including uterus, ovaries, adnexal regions, and pelvic cul-de-sac. It was necessary to proceed with endovaginal exam following the transabdominal exam to visualize the left ovary. COMPARISON:  None Available. FINDINGS: Uterus Measurements: Surgically absent. Endometrium Thickness: N/A. Right ovary Measurements: Surgically absent. Left ovary Measurements: 3.6 x  3.5 x 4.3 cm = volume: 28 mL. Normal appearance/no adnexal mass. Other findings No abnormal free fluid. IMPRESSION: Unremarkable. No findings to explain the patient's history of suprapubic pain. Electronically Signed   By: Misty Stanley M.D.   On: 06/21/2021 12:37   ? ?Procedures ?Procedures  ? ? ?Medications Ordered in ED ?Medications  ?diphenhydrAMINE (BENADRYL) injection 25 mg (25 mg Intravenous Given 06/21/21 1136)  ?prochlorperazine (COMPAZINE) injection 10 mg (10 mg Intravenous Given 06/21/21 1139)  ?diphenhydrAMINE (BENADRYL) injection 25 mg (25 mg Intravenous Given 06/21/21 1339)  ? ? ?ED Course/ Medical Decision Making/ A&P ?Clinical Course as of 06/21/21 1535  ?Mon Jun 21, 2021  ?1507 Pt asked on 2 different occasions to allow [place] to proceed with LP. She has declined. She prefers getting it done later on.  ?Her headache has improved with the Compazine and Benadryl.  In fact headache has resolved.  Patient did have some extrapyramidal symptoms with her Compazine, that she required an additional Benadryl dose for. [AN]  ?1532 I was able  to get patient fluoroscopy guided LP scheduled for tomorrow at 9:30 AM. ? ?Again, we do not have any concerns for subarachnoid hemorrhage, intracranial infection, therefore no lab work-up is ordered.  This will be strictly for opening pressure only. ? ?I will put in a follow-up ambulatory order for neurology, she can be seen after the LP [AN]  ?  ?Clinical Course User Index ?[AN] Varney Biles, MD  ? ?                        ?Medical Decision Making ?Amount and/or Complexity of Data Reviewed ?Labs: ordered. ?Radiology: ordered. ? ?Risk ?Prescription drug management. ? ? ?This patient presents to the ED with chief complaint(s) of headache, abdominal pain with pertinent past medical history of fibroids which further complicates the presenting complaint. The complaint involves an extensive differential diagnosis and treatment options and also carries with it a high risk of  complications and morbidity.   ? ?The differential diagnosis includes : ?Patient's headache could be IIH given that she is having constant headaches that are worse with her laying flat or at nighttime.  Given that she has ne

## 2021-06-21 NOTE — ED Notes (Signed)
Two unsuccessful IV attempts.

## 2021-06-21 NOTE — Discharge Instructions (Addendum)
We signed the ER for abdominal pain and headaches. ? ?The ultrasound of your pelvis is reassuring.  We recommend that you follow-up with your primary care doctor for further evaluation of the pain continues. ? ?Return to the ER if you start having nausea, vomiting, fevers, chills, bloody stools, vaginal bleeding. ? ?For your headaches, you have declined getting lumbar puncture in the ER.  We have scheduled an outpatient fluoroscopy guided lumbar puncture for tomorrow at 9:30 AM.  The radiology team is requested that you check in around 8:45 AM. ?The test is to see if you have a condition called idiopathic intracranial hypertension. ? ?We also want you to follow-up with neurology doctors after the LP is completed. ?

## 2021-06-21 NOTE — ED Triage Notes (Signed)
Patient reports to the ER for abdominal pain. Patient endorses some itching and vaginal discharge. Endorses she is a diabetic. Last intercourse x1 week ago. Denies concerns for STI. ?Patient denies nausea or emesis. ? ?

## 2021-06-21 NOTE — ED Notes (Signed)
Pt called out d/t feeling like she was "unable to control her body" says it started when she was in [place] ?

## 2021-06-21 NOTE — ED Notes (Signed)
Lumbar tray at bedside  ?

## 2021-06-22 ENCOUNTER — Inpatient Hospital Stay
Admission: RE | Admit: 2021-06-22 | Discharge: 2021-06-22 | Disposition: A | Discharge status: Home / Self Care | Payer: Medicaid Other | Source: Ambulatory Visit | Attending: Emergency Medicine | Admitting: Emergency Medicine

## 2021-06-22 NOTE — Discharge Instructions (Signed)

## 2021-07-01 ENCOUNTER — Ambulatory Visit
Admission: RE | Admit: 2021-07-01 | Discharge: 2021-07-01 | Disposition: A | Discharge status: Home / Self Care | Payer: Medicaid Other | Source: Ambulatory Visit | Attending: Urgent Care | Admitting: Urgent Care

## 2021-07-01 VITALS — HR 105 | Temp 98.6°F | Resp 20

## 2021-07-01 DIAGNOSIS — E119 Type 2 diabetes mellitus without complications: Secondary | ICD-10-CM | POA: Insufficient documentation

## 2021-07-01 DIAGNOSIS — B3731 Acute candidiasis of vulva and vagina: Secondary | ICD-10-CM | POA: Diagnosis not present

## 2021-07-01 DIAGNOSIS — B9689 Other specified bacterial agents as the cause of diseases classified elsewhere: Secondary | ICD-10-CM | POA: Diagnosis present

## 2021-07-01 DIAGNOSIS — N760 Acute vaginitis: Secondary | ICD-10-CM | POA: Insufficient documentation

## 2021-07-01 DIAGNOSIS — M545 Low back pain, unspecified: Secondary | ICD-10-CM | POA: Insufficient documentation

## 2021-07-01 LAB — POCT URINALYSIS DIP (MANUAL ENTRY)
Bilirubin, UA: NEGATIVE
Blood, UA: NEGATIVE
Glucose, UA: NEGATIVE mg/dL
Ketones, POC UA: NEGATIVE mg/dL
Leukocytes, UA: NEGATIVE
Nitrite, UA: NEGATIVE
Protein Ur, POC: >=300 mg/dL — AB
Spec Grav, UA: >=1.03 — AB (ref 1.010–1.025)
Urobilinogen, UA: 0.2 E.U./dL
pH, UA: 5.5 (ref 5.0–8.0)

## 2021-07-01 MED ORDER — METRONIDAZOLE 500 MG PO TABS: 500.0000 mg | ORAL_TABLET | Freq: Two times a day (BID) | ORAL | 0 refills | Status: DC

## 2021-07-01 MED ORDER — NAPROXEN 375 MG PO TABS: 375.0000 mg | ORAL_TABLET | Freq: Two times a day (BID) | ORAL | 0 refills | Status: DC

## 2021-07-01 MED ORDER — TIZANIDINE HCL 4 MG PO TABS: 4.0000 mg | ORAL_TABLET | Freq: Every day | ORAL | 0 refills | Status: DC

## 2021-07-01 MED ORDER — FLUCONAZOLE 150 MG PO TABS: 150.0000 mg | ORAL_TABLET | ORAL | 0 refills | Status: DC

## 2021-07-01 NOTE — ED Provider Notes (Signed)
[facility]   MRN: [id number] DOB: 25-May-1985  Subjective:   Elaine Perez is a 36 y.o. female presenting for 3-day history of acute onset vaginal discharge, malodorous discharge, vaginal irritation worse when she urinates.  She is also used to urinating frequently.  This prevents her from hydrating consistently.  Has type 2 diabetes that is generally well controlled without insulin.  Has had some intermittent low back pains.  Patient does [place]  She also spends time outdoors at work.  No fall, trauma, weakness, numbness or tingling, changes to bowel habits.  Has a history of bacterial vaginosis and yeast infections and would like to get treated for this.  She is sexually active with her husband only but would like to make sure she gets checked for STIs.  Of note, the patient does admit that she actually douches regularly using bottles of dish soap that she tries to rinse out before adding her own soap and using this during showers.  No current facility-administered medications for this encounter.  Current Outpatient Medications:    carvedilol (COREG) 6.25 MG tablet, Take 6.25 mg by mouth 2 (two) times daily., Disp: , Rfl:    Fluticasone-Salmeterol (ADVAIR) 250-50 MCG/DOSE AEPB, Inhale 1 puff into the lungs daily. , Disp: , Rfl:    furosemide (LASIX) 20 MG tablet, Take 1 tablet (20 mg total) by mouth daily., Disp: 30 tablet, Rfl: 0   guaiFENesin (MUCINEX) 600 MG 12 hr tablet, Take 1 tablet (600 mg total) by mouth 2 (two) times daily., Disp: 6 tablet, Rfl: 0   hydrochlorothiazide (HYDRODIURIL) 25 MG tablet, Take 1 tablet (25 mg total) by mouth daily., Disp: 30 tablet, Rfl: 1   Multiple Vitamin (MULTIVITAMIN WITH MINERALS) TABS tablet, Take 1 tablet by mouth daily., Disp: , Rfl:    naproxen (NAPROSYN) 500 MG tablet, Take 1 tablet (500 mg total) by mouth 2 (two) times daily. (Patient not taking: Reported on  07/21/2020), Disp: 30 tablet, Rfl: 0   naproxen sodium (ALEVE) 220 MG tablet, Take 440 mg by mouth 2 (two) times daily as needed (pain.headache)., Disp: , Rfl:    ondansetron (ZOFRAN ODT) 4 MG disintegrating tablet, Take 1 tablet (4 mg total) by mouth every 8 (eight) hours as needed for nausea or vomiting. (Patient not taking: Reported on 07/21/2020), Disp: 5 tablet, Rfl: 0   ondansetron (ZOFRAN) 4 MG tablet, Take 1 tablet (4 mg total) by mouth every 6 (six) hours., Disp: 12 tablet, Rfl: 0   Allergies  Allergen Reactions   Morphine And Related Itching and Rash    swelling swelling swelling swelling     Past Medical History:  Diagnosis Date   Asthma    Blood transfusion without reported diagnosis 2019   Diabetes mellitus without complication (HCC)    Fibroids    Hypertension    Iron deficiency anemia 04/27/2018   Sleep apnea    Does not use CPAP     Past Surgical History:  Procedure Laterality Date   ABDOMINAL HYSTERECTOMY     GALLBLADDER SURGERY     REPEAT CESAREAN SECTION      Family History  Problem Relation Age of Onset   Breast cancer Other    Heart disease Mother    Lupus Maternal Aunt    Stomach cancer Maternal Aunt    Colon cancer Neg Hx    Esophageal cancer Neg Hx    Rectal cancer Neg Hx  Social History   Tobacco Use   Smoking status: Never   Smokeless tobacco: Never  Vaping Use   Vaping Use: Never used  Substance Use Topics   Alcohol use: Never   Drug use: Never    ROS   Objective:   Vitals: Pulse (!) 105   Temp 98.6 F (37 C)   Resp 20   LMP 05/05/2019 (Approximate)   SpO2 98%   Physical Exam Constitutional:      General: She is not in acute distress.    Appearance: Normal appearance. She is well-developed. She is not ill-appearing, toxic-appearing or diaphoretic.  HENT:     Head: Normocephalic and atraumatic.     Nose: Nose normal.     Mouth/Throat:     Mouth: Mucous membranes are moist.  Eyes:     General: No scleral icterus.        Right eye: No discharge.        Left eye: No discharge.     Extraocular Movements: Extraocular movements intact.     Conjunctiva/sclera: Conjunctivae normal.  Cardiovascular:     Rate and Rhythm: Normal rate.  Pulmonary:     Effort: Pulmonary effort is normal.  Abdominal:     General: Bowel sounds are normal. There is no distension.     Palpations: Abdomen is soft. There is no mass.     Tenderness: There is no abdominal tenderness. There is no right CVA tenderness, left CVA tenderness, guarding or rebound.  Musculoskeletal:     Comments: Full range of motion throughout.  Strength 5/5 for lower extremities.  Patient ambulates without any assistance at expected pace.  No ecchymosis, swelling, lacerations or abrasions.  Patient does have paraspinal muscle tenderness along the lumbar region of his back excluding the midline.  Skin:    General: Skin is warm and dry.  Neurological:     General: No focal deficit present.     Mental Status: She is alert and oriented to person, place, and time.  Psychiatric:        Mood and Affect: Mood normal.        Behavior: Behavior normal.        Thought Content: Thought content normal.        Judgment: Judgment normal.    Results for orders placed or performed during the hospital encounter of 07/01/21 (from the past 24 hour(s))  POCT urinalysis dipstick     Status: Abnormal   Collection Time: 07/01/21  6:35 PM  Result Value Ref Range   Color, UA yellow yellow   Clarity, UA clear clear   Glucose, UA negative negative mg/dL   Bilirubin, UA negative negative   Ketones, POC UA negative negative mg/dL   Spec Grav, UA >=1.030 (A) 1.010 - 1.025   Blood, UA negative negative   pH, UA 5.5 5.0 - 8.0   Protein Ur, POC >=300 (A) negative mg/dL   Urobilinogen, UA 0.2 0.2 or 1.0 E.U./dL   Nitrite, UA Negative Negative   Leukocytes, UA Negative Negative    Assessment and Plan :   PDMP not reviewed this encounter.  1. Bacterial vaginosis   2. Yeast  vaginitis   3. Acute bilateral low back pain without sciatica   4. Type 2 diabetes mellitus treated without insulin ([place])     Advised patient stop using dish soap dispenser and in general stop douching.  I will cover for bacterial vaginosis and yeast vaginitis empirically with Flagyl and fluconazole.  Labs pending.  For her back, suspect that she strained it due to the nature of her work, degenerative disc disease and recommended that she start hydrating much better.  We will use naproxen and tizanidine.  Avoid steroid use for now especially in the context of her diabetes.  Counseled patient on potential for adverse effects with medications prescribed/recommended today, ER and return-to-clinic precautions discussed, patient verbalized understanding.    Jaynee Eagles, [place] 07/01/21 1930

## 2021-07-01 NOTE — ED Triage Notes (Signed)
Pt here with 3 day hx of fishy odor in vaginal area with irritation and lower back pain.

## 2021-07-06 LAB — CERVICOVAGINAL ANCILLARY ONLY
Bacterial Vaginitis (gardnerella): POSITIVE — AB
Candida Glabrata: NEGATIVE
Candida Vaginitis: NEGATIVE
Chlamydia: NEGATIVE
Comment: NEGATIVE
Comment: NEGATIVE
Comment: NEGATIVE
Comment: NEGATIVE
Comment: NEGATIVE
Comment: NORMAL
Neisseria Gonorrhea: NEGATIVE
Trichomonas: NEGATIVE

## 2021-07-25 IMAGING — CR DG CHEST 2V
2 series · 2 of 2 positions shown · non-contrast
Comparison: 02/21/2020

CLINICAL DATA: Hypertension

EXAM:
CHEST - 2 VIEW

[w chest pa]
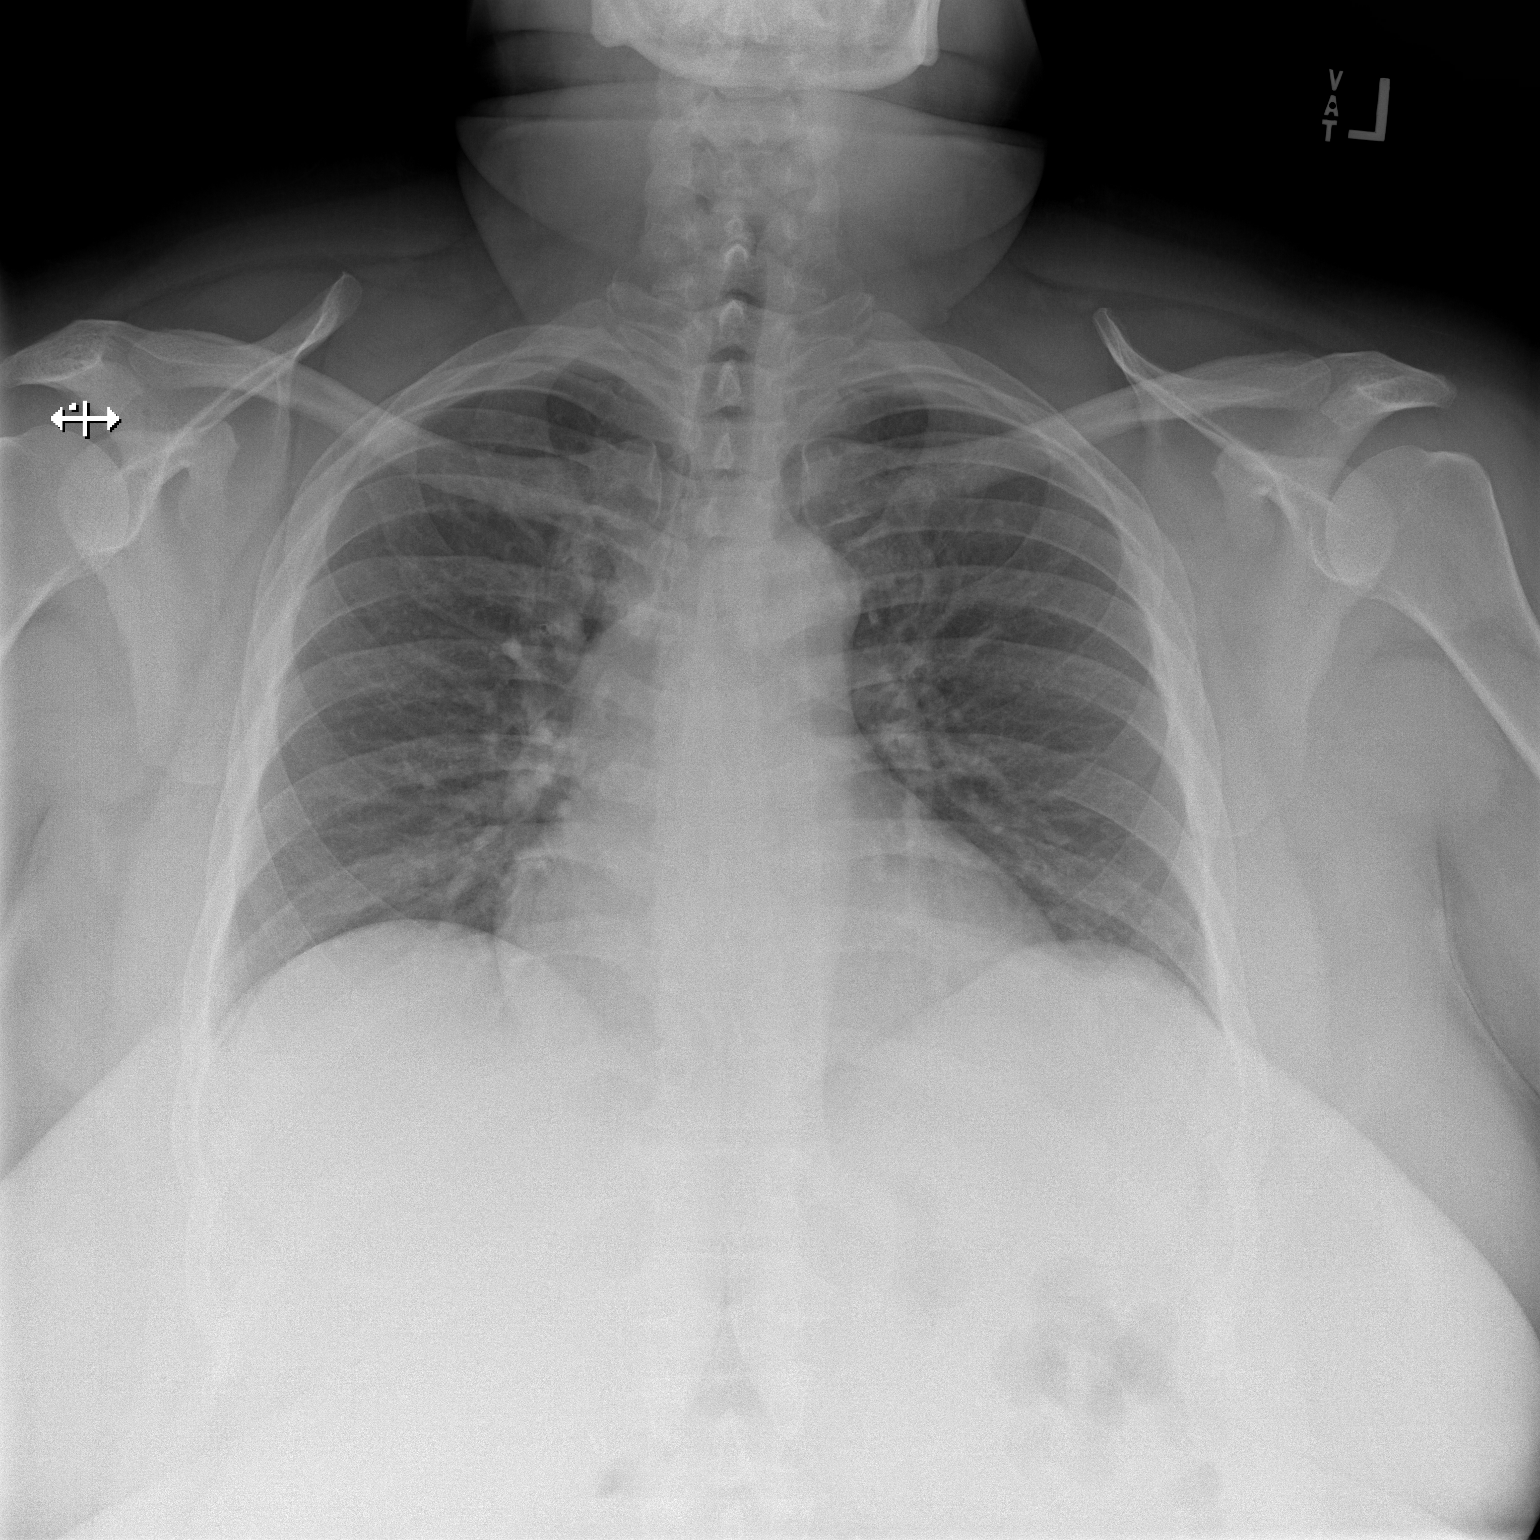

[w chest lat]
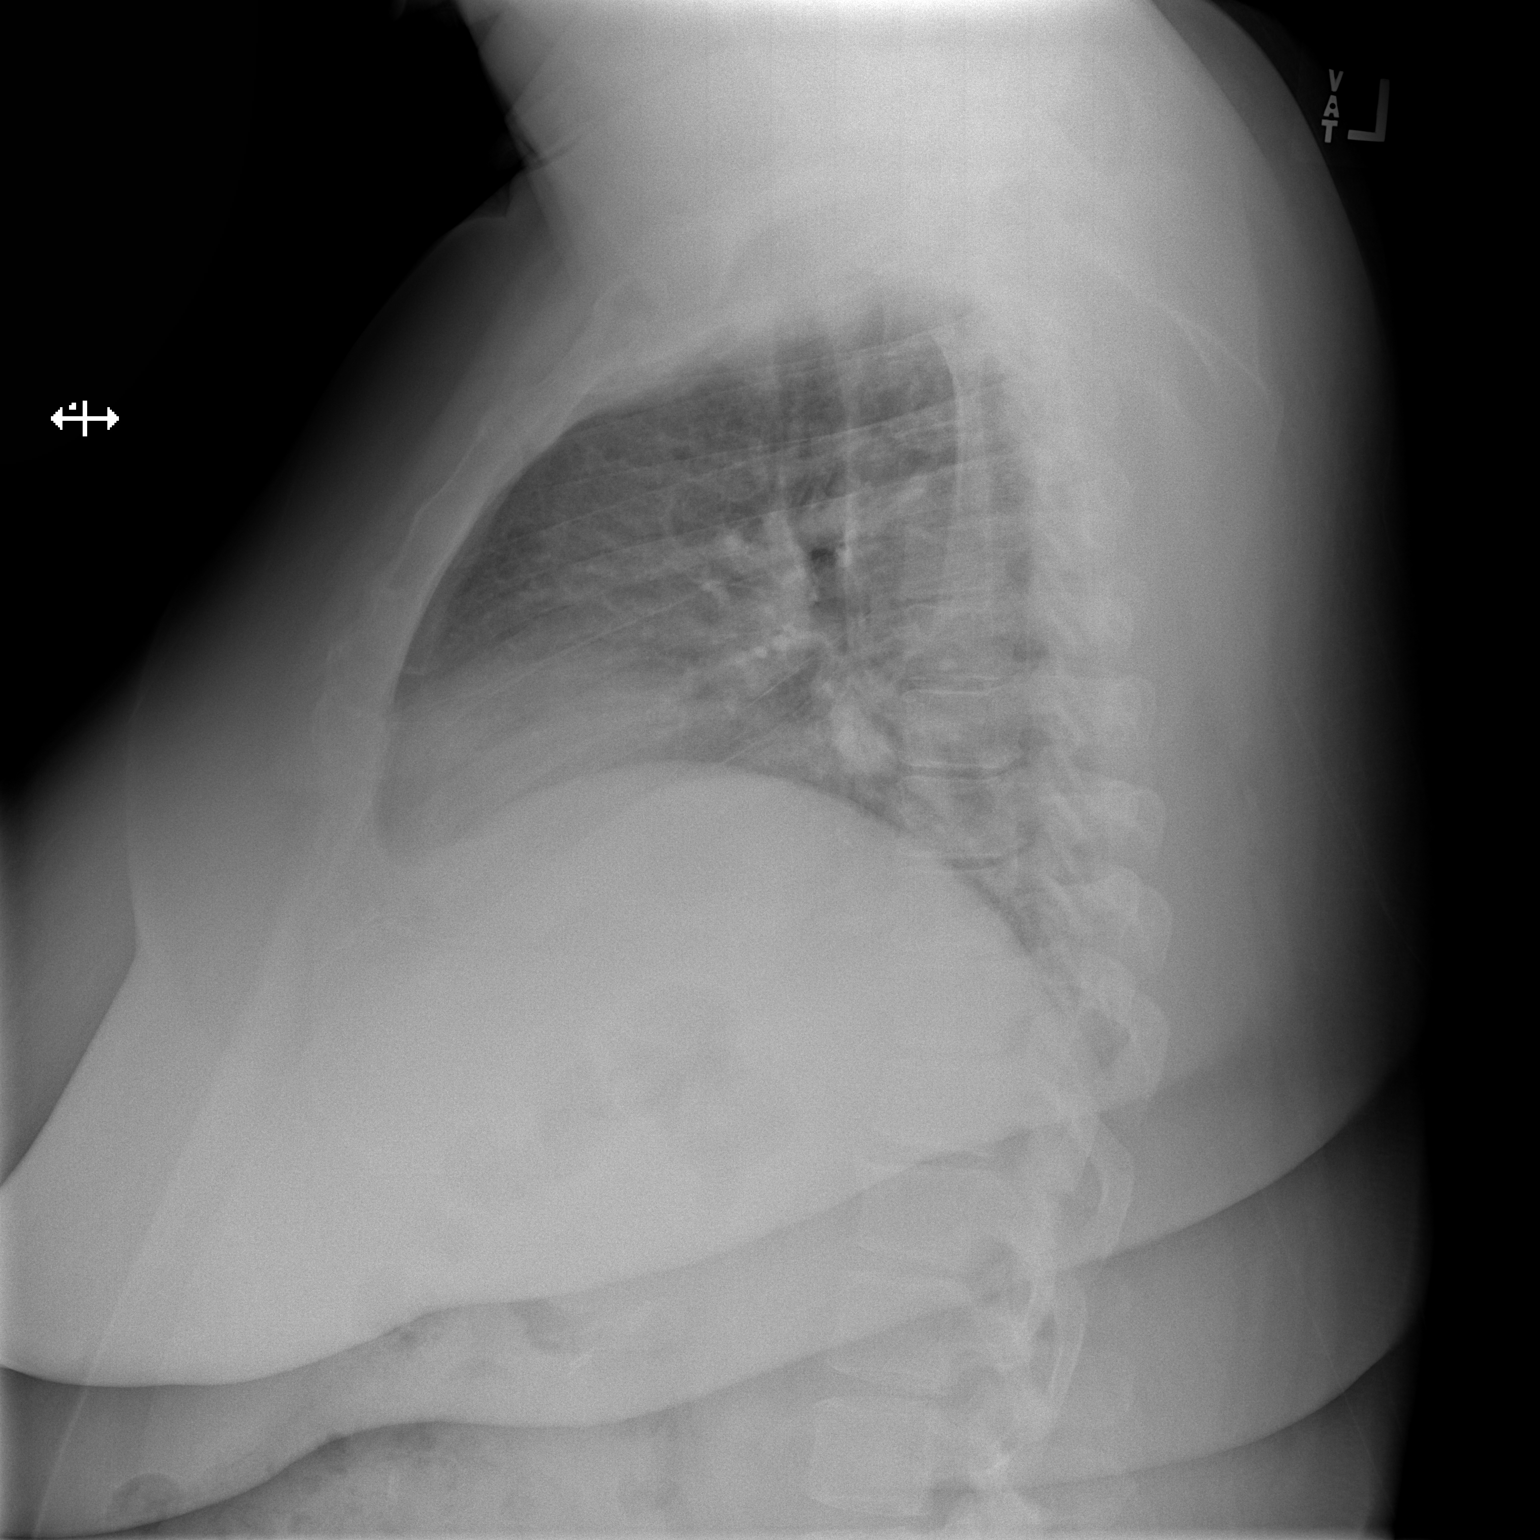

[2 of 2 positions shown; findings below may reference images not displayed]

FINDINGS: Heart and mediastinal contours are within normal limits. No focal
opacities or effusions. No acute bony abnormality. Low lung volumes.
IMPRESSION: No active cardiopulmonary disease.

## 2021-09-09 ENCOUNTER — Ambulatory Visit
Admission: RE | Admit: 2021-09-09 | Discharge: 2021-09-09 | Disposition: A | Discharge status: Home / Self Care | Payer: Medicaid Other | Source: Ambulatory Visit | Attending: Urgent Care | Admitting: Urgent Care

## 2021-09-09 VITALS — BP 152/100 | HR 82 | Temp 98.6°F | Resp 20

## 2021-09-09 DIAGNOSIS — I10 Essential (primary) hypertension: Secondary | ICD-10-CM

## 2021-09-09 DIAGNOSIS — E119 Type 2 diabetes mellitus without complications: Secondary | ICD-10-CM | POA: Diagnosis not present

## 2021-09-09 DIAGNOSIS — G44209 Tension-type headache, unspecified, not intractable: Secondary | ICD-10-CM

## 2021-09-09 DIAGNOSIS — R030 Elevated blood-pressure reading, without diagnosis of hypertension: Secondary | ICD-10-CM

## 2021-09-09 LAB — POCT FASTING CBG KUC MANUAL ENTRY: POCT Glucose (KUC): 170 mg/dL — AB (ref 70–99)

## 2021-09-09 MED ORDER — AMLODIPINE BESYLATE 10 MG PO TABS: 10.0000 mg | ORAL_TABLET | Freq: Every day | ORAL | 0 refills | Status: AC
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
## 2021-09-09 NOTE — Discharge Instructions (Signed)

## 2021-09-09 NOTE — ED Provider Notes (Signed)
[place]   MRN: [id number] DOB: 06-17-1985  Subjective:   Elaine Perez is a 36 y.o. female presenting for check on her blood sugar and blood pressure.  Patient reports that she had a sudden pounding headache yesterday.  It did resolve with over-the-counter medication.  However, she was concerned about her diabetes and blood pressure affecting her.  She would like to have both checked today.  No vision changes, confusion, chest pain, shortness of breath, abdominal pain, hematuria.  She is managed by her PCP and confirms that she is on carvedilol, labetalol, furosemide, hydrochlorothiazide, amlodipine.  She has follow-up with them next week.  She is trying to eat healthier but would like more recommendations.  She is not on any insulin.  No history of heart disease, stroke, MI.  No history of kidney disease.  No current facility-administered medications for this encounter.  Current Outpatient Medications:    amLODipine (NORVASC) 5 MG tablet, Take 5 mg by mouth daily., Disp: , Rfl:    carvedilol (COREG) 6.25 MG tablet, Take 6.25 mg by mouth 2 (two) times daily., Disp: , Rfl:    fluconazole (DIFLUCAN) 150 MG tablet, Take 1 tablet (150 mg total) by mouth once a week., Disp: 2 tablet, Rfl: 0   Fluticasone-Salmeterol (ADVAIR) 250-50 MCG/DOSE AEPB, Inhale 1 puff into the lungs daily. , Disp: , Rfl:    furosemide (LASIX) 20 MG tablet, Take 1 tablet (20 mg total) by mouth daily., Disp: 30 tablet, Rfl: 0   guaiFENesin (MUCINEX) 600 MG 12 hr tablet, Take 1 tablet (600 mg total) by mouth 2 (two) times daily., Disp: 6 tablet, Rfl: 0   hydrochlorothiazide (HYDRODIURIL) 25 MG tablet, Take 1 tablet (25 mg total) by mouth daily., Disp: 30 tablet, Rfl: 1   labetalol (NORMODYNE) 100 MG tablet, Take 100 mg by mouth 2 (two) times daily., Disp: , Rfl:    metFORMIN (GLUCOPHAGE-XR) 500 MG 24 hr tablet, Take 500 mg by mouth daily., Disp: , Rfl:    metroNIDAZOLE (FLAGYL) 500 MG tablet, Take 1  tablet (500 mg total) by mouth 2 (two) times daily with a meal. DO NOT CONSUME ALCOHOL WHILE TAKING THIS MEDICATION., Disp: 14 tablet, Rfl: 0   Multiple Vitamin (MULTIVITAMIN WITH MINERALS) TABS tablet, Take 1 tablet by mouth daily., Disp: , Rfl:    naproxen (NAPROSYN) 375 MG tablet, Take 1 tablet (375 mg total) by mouth 2 (two) times daily with a meal., Disp: 30 tablet, Rfl: 0   naproxen sodium (ALEVE) 220 MG tablet, Take 440 mg by mouth 2 (two) times daily as needed (pain.headache)., Disp: , Rfl:    ondansetron (ZOFRAN ODT) 4 MG disintegrating tablet, Take 1 tablet (4 mg total) by mouth every 8 (eight) hours as needed for nausea or vomiting. (Patient not taking: Reported on 07/21/2020), Disp: 5 tablet, Rfl: 0   ondansetron (ZOFRAN) 4 MG tablet, Take 1 tablet (4 mg total) by mouth every 6 (six) hours., Disp: 12 tablet, Rfl: 0   OZEMPIC, 0.25 OR 0.5 MG/DOSE, 2 MG/1.5ML SOPN, SMARTSIG:0.25 Milligram(s) SUB-Q Once a Week, Disp: , Rfl:    tiZANidine (ZANAFLEX) 4 MG tablet, Take 1 tablet (4 mg total) by mouth at bedtime., Disp: 30 tablet, Rfl: 0   Allergies  Allergen Reactions   Morphine And Related Itching and Rash    swelling swelling swelling swelling     Past Medical History:  Diagnosis Date   Asthma    Blood transfusion without reported diagnosis 2019   Diabetes  mellitus without complication (HCC)    Fibroids    Hypertension    Iron deficiency anemia 04/27/2018   Sleep apnea    Does not use CPAP     Past Surgical History:  Procedure Laterality Date   ABDOMINAL HYSTERECTOMY     GALLBLADDER SURGERY     REPEAT CESAREAN SECTION      Family History  Problem Relation Age of Onset   Breast cancer Other    Heart disease Mother    Lupus Maternal Aunt    Stomach cancer Maternal Aunt    Colon cancer Neg Hx    Esophageal cancer Neg Hx    Rectal cancer Neg Hx     Social History   Tobacco Use   Smoking status: Never   Smokeless tobacco: Never  Vaping Use   Vaping Use: Never  used  Substance Use Topics   Alcohol use: Never   Drug use: Never    ROS   Objective:   Vitals: BP (!) 152/100 Comment: Checked twice  Pulse 82   Temp 98.6 F (37 C)   Resp 20   LMP 05/05/2019 (Approximate)   SpO2 98%   Physical Exam Constitutional:      General: She is not in acute distress.    Appearance: Normal appearance. She is well-developed. She is not ill-appearing, toxic-appearing or diaphoretic.  HENT:     Head: Normocephalic and atraumatic.     Nose: Nose normal.     Mouth/Throat:     Mouth: Mucous membranes are moist.  Eyes:     General: No scleral icterus.       Right eye: No discharge.        Left eye: No discharge.     Extraocular Movements: Extraocular movements intact.  Cardiovascular:     Rate and Rhythm: Normal rate and regular rhythm.     Heart sounds: Normal heart sounds. No murmur heard.    No friction rub. No gallop.  Pulmonary:     Effort: Pulmonary effort is normal. No respiratory distress.     Breath sounds: No stridor. No wheezing, rhonchi or rales.  Chest:     Chest wall: No tenderness.  Skin:    General: Skin is warm and dry.  Neurological:     General: No focal deficit present.     Mental Status: She is alert and oriented to person, place, and time.     Cranial Nerves: No cranial nerve deficit.     Motor: No weakness.     Coordination: Coordination normal.     Gait: Gait normal.     Comments: Negative Romberg and pronator drift.  No facial asymmetry.  Psychiatric:        Mood and Affect: Mood normal.        Behavior: Behavior normal.     Recent Results (from the past 2160 hour(s))  Urinalysis, Routine w reflex microscopic Urine, Clean Catch     Status: Abnormal   Collection Time: 06/21/21  9:52 AM  Result Value Ref Range   Color, Urine YELLOW YELLOW   APPearance CLEAR CLEAR   Specific Gravity, Urine 1.027 1.005 - 1.030   pH 6.0 5.0 - 8.0   Glucose, UA NEGATIVE NEGATIVE mg/dL   Hgb urine dipstick NEGATIVE NEGATIVE    Bilirubin Urine NEGATIVE NEGATIVE   Ketones, ur NEGATIVE NEGATIVE mg/dL   Protein, ur 30 (A) NEGATIVE mg/dL   Nitrite NEGATIVE NEGATIVE   Leukocytes,Ua NEGATIVE NEGATIVE   WBC, UA 0-5 0 -  5 WBC/hpf   Squamous Epithelial / LPF 0-5 0 - 5    Comment: Performed at [facility], [address], [place], [place] [address]  Pregnancy, urine     Status: None   Collection Time: 06/21/21  9:52 AM  Result Value Ref Range   Preg Test, Ur NEGATIVE NEGATIVE    Comment:        THE SENSITIVITY OF THIS METHODOLOGY IS >20 mIU/mL. Performed at [facility], [address], [place], [place] [address]   Comprehensive metabolic panel     Status: Abnormal   Collection Time: 06/21/21 11:01 AM  Result Value Ref Range   Sodium 138 135 - 145 mmol/L   Potassium 3.8 3.5 - 5.1 mmol/L   Chloride 102 98 - 111 mmol/L   CO2 26 22 - 32 mmol/L   Glucose, Bld 143 (H) 70 - 99 mg/dL    Comment: Glucose reference range applies only to samples taken after fasting for at least 8 hours.   BUN 16 6 - 20 mg/dL   Creatinine, Ser 0.66 0.44 - 1.00 mg/dL   Calcium 9.5 8.9 - 10.3 mg/dL   Total Protein 7.3 6.5 - 8.1 g/dL   Albumin 4.0 3.5 - 5.0 g/dL   AST 15 15 - 41 U/L   ALT 28 0 - 44 U/L   Alkaline Phosphatase 65 38 - 126 U/L   Total Bilirubin 0.4 0.3 - 1.2 mg/dL   GFR, Estimated >60 >60 mL/min    Comment: (NOTE) Calculated using the CKD-EPI Creatinine Equation (2021)    Anion gap 10 5 - 15    Comment: Performed at [facility], [place], [place] [address]  CBC with Differential     Status: None   Collection Time: 06/21/21 11:01 AM  Result Value Ref Range   WBC 7.0 4.0 - 10.5 K/uL   RBC 5.07 3.87 - 5.11 MIL/uL   Hemoglobin 14.6 12.0 - 15.0 g/dL   HCT 44.7 36.0 - 46.0 %   MCV 88.2 80.0 - 100.0 fL   MCH 28.8 26.0 - 34.0 pg   MCHC 32.7 30.0 - 36.0 g/dL   RDW 12.4 11.5 - 15.5 %   Platelets 272 150 - 400 K/uL   nRBC 0.0 0.0 - 0.2 %    Neutrophils Relative % 48 %   Neutro Abs 3.4 1.7 - 7.7 K/uL   Lymphocytes Relative 39 %   Lymphs Abs 2.7 0.7 - 4.0 K/uL   Monocytes Relative 9 %   Monocytes Absolute 0.6 0.1 - 1.0 K/uL   Eosinophils Relative 3 %   Eosinophils Absolute 0.2 0.0 - 0.5 K/uL   Basophils Relative 1 %   Basophils Absolute 0.1 0.0 - 0.1 K/uL   Immature Granulocytes 0 %   Abs Immature Granulocytes 0.02 0.00 - 0.07 K/uL    Comment: Performed at [facility], [address], [place], [place] [address]  Cervicovaginal ancillary only     Status: Abnormal   Collection Time: 07/01/21  6:35 PM  Result Value Ref Range   Neisseria Gonorrhea Negative    Chlamydia Negative    Trichomonas Negative    Bacterial Vaginitis (gardnerella) Positive (A)    Candida Vaginitis Negative    Candida Glabrata Negative    Comment Normal Reference Range Candida Species - Negative    Comment Normal Reference Range Candida Galbrata - Negative    Comment Normal Reference Range Trichomonas - Negative    Comment Normal Reference [place]  Chlamydia - Negative    Comment      Normal Reference Range Neisseria Gonorrhea - Negative   Comment      Normal Reference Range Bacterial Vaginosis - Negative  POCT urinalysis dipstick     Status: Abnormal   Collection Time: 07/01/21  6:35 PM  Result Value Ref Range   Color, UA yellow yellow   Clarity, UA clear clear   Glucose, UA negative negative mg/dL   Bilirubin, UA negative negative   Ketones, POC UA negative negative mg/dL   Spec Grav, UA >=1.030 (A) 1.010 - 1.025   Blood, UA negative negative   pH, UA 5.5 5.0 - 8.0   Protein Ur, POC >=300 (A) negative mg/dL   Urobilinogen, UA 0.2 0.2 or 1.0 E.U./dL   Nitrite, UA Negative Negative   Leukocytes, UA Negative Negative  POCT CBG (manual entry)     Status: Abnormal   Collection Time: 09/09/21  8:58 AM  Result Value Ref Range   POCT Glucose ([facility]) 170 (A) 70 - 99 mg/dL     Assessment and Plan :   PDMP not reviewed this  encounter.  1. Essential hypertension   2. Elevated blood pressure reading   3. Type 2 diabetes mellitus treated without insulin ([place])   4. Tension headache    No signs of an acute encephalopathy on exam today.  Recommended thorough review of her blood pressure medications with her PCP at her upcoming visit.  For now I will have her increase her amlodipine to 10 mg.  A new prescription was sent to her pharmacy for this.  Maintain all other medications.  Discussed diabetic friendly and hypertensive friendly diet.  Will defer ER visit for now. Counseled patient on potential for adverse effects with medications prescribed/recommended today, ER and return-to-clinic precautions discussed, patient verbalized understanding.    Jaynee Eagles, [place] 09/09/21 [phone]

## 2021-09-21 ENCOUNTER — Emergency Department (HOSPITAL_BASED_OUTPATIENT_CLINIC_OR_DEPARTMENT_OTHER)
Admission: EM | Admit: 2021-09-21 | Discharge: 2021-09-21 | Disposition: A | Discharge status: Home / Self Care | Payer: Medicaid Other | Attending: Emergency Medicine | Admitting: Emergency Medicine

## 2021-09-21 ENCOUNTER — Encounter (HOSPITAL_BASED_OUTPATIENT_CLINIC_OR_DEPARTMENT_OTHER): Payer: Self-pay

## 2021-09-21 ENCOUNTER — Other Ambulatory Visit: Payer: Self-pay

## 2021-09-21 ENCOUNTER — Emergency Department (HOSPITAL_BASED_OUTPATIENT_CLINIC_OR_DEPARTMENT_OTHER): Payer: Medicaid Other | Admitting: Radiology

## 2021-09-21 DIAGNOSIS — I10 Essential (primary) hypertension: Secondary | ICD-10-CM | POA: Diagnosis not present

## 2021-09-21 DIAGNOSIS — Z7984 Long term (current) use of oral hypoglycemic drugs: Secondary | ICD-10-CM | POA: Diagnosis not present

## 2021-09-21 DIAGNOSIS — M79602 Pain in left arm: Secondary | ICD-10-CM | POA: Insufficient documentation

## 2021-09-21 DIAGNOSIS — M25512 Pain in left shoulder: Secondary | ICD-10-CM | POA: Insufficient documentation

## 2021-09-21 DIAGNOSIS — Z79899 Other long term (current) drug therapy: Secondary | ICD-10-CM | POA: Insufficient documentation

## 2021-09-21 MED ORDER — GABAPENTIN 300 MG PO CAPS: 300.0000 mg | ORAL_CAPSULE | Freq: Three times a day (TID) | ORAL | 0 refills | Status: DC | PRN

## 2021-09-21 MED ORDER — PREDNISONE 10 MG PO TABS: 50.0000 mg | ORAL_TABLET | Freq: Every day | ORAL | 0 refills | Status: AC

## 2021-09-21 MED ORDER — IBUPROFEN 800 MG PO TABS: 800.0000 mg | ORAL_TABLET | Freq: Three times a day (TID) | ORAL | 0 refills | Status: DC | PRN

## 2021-09-21 NOTE — Discharge Instructions (Addendum)
The pain you are experiencing may be related to your left shoulder or also your cervical spine.  Both of these suggest pain that radiates down into your arm.  I recommend you take ibuprofen as prescribed, as well as a short course of steroids as prescribed, you can also try gabapentin which is a nerve pain medicine.  Consider alternating ice and frozen packs and heating packs, whichever is more helpful or feels better, would be more beneficial.  Also schedule follow-up appointment with the sports medicine doctor.  Do not take any additional NSAIDs when you are taking ibuprofen.  These are medications that would include Aleve, Advil, naproxen, and other nonsteroidal anti-inflammatory medicines.  Your blood pressure was also extremely high today.  It is very important you take all of your blood pressure medicines regularly as prescribed.  Please follow-up with your doctors office or whoever manages your blood pressure, continue to monitor your blood pressure daily.

## 2021-09-21 NOTE — ED Provider Notes (Signed)
[facility] EMERGENCY DEPT Provider Note   CSN: [id number] Arrival date & time: 09/21/21  1238     History  Chief Complaint  Patient presents with   Arm Pain    Elaine Perez is a 36 y.o. female presented ED with left arm pain.  She reports this began about 3 days ago.  She says it feels like a recurrence of an old injury she had, she had cervical radiculopathy or shoulder pain about 2 years ago, underwent PT.  The pain got better but does not return.  Radiates from the left side of her neck down through her shoulder into her left hand.  Hurts with any type of movement of the left hand or arm.  She does do physical work.  Per review of external records she also is a history of persistent and highly resistant hypertension.  She is on multiple blood pressure medications.  She did not take her morning medicines this morning because she did not get the chance to do so.  Normally she is compliant with her medicines.  She is aware of her high blood pressure  HPI     Home Medications Prior to Admission medications   Medication Sig Start Date End Date Taking? Authorizing Provider  gabapentin (NEURONTIN) 300 MG capsule Take 1 capsule (300 mg total) by mouth 3 (three) times daily as needed for up to 21 doses. 09/21/21  Yes Emmylou Bieker, Carola Rhine, MD  ibuprofen (ADVIL) 800 MG tablet Take 1 tablet (800 mg total) by mouth every 8 (eight) hours as needed for up to 30 doses. 09/21/21  Yes Alejandra Hunt, Carola Rhine, MD  predniSONE (DELTASONE) 10 MG tablet Take 5 tablets (50 mg total) by mouth daily with breakfast for 5 days. 09/22/21 09/27/21 Yes Tawsha Terrero, Carola Rhine, MD  amLODipine (NORVASC) 10 MG tablet Take 1 tablet (10 mg total) by mouth daily. 09/09/21   Jaynee Eagles, PA-C  carvedilol (COREG) 6.25 MG tablet Take 6.25 mg by mouth 2 (two) times daily. 05/16/20   [provider]  fluconazole (DIFLUCAN) 150 MG tablet Take 1 tablet (150 mg total) by mouth once a week. 07/01/21   Jaynee Eagles, PA-C   Fluticasone-Salmeterol (ADVAIR) 250-50 MCG/DOSE AEPB Inhale 1 puff into the lungs daily.  05/23/18   [provider]  furosemide (LASIX) 20 MG tablet Take 1 tablet (20 mg total) by mouth daily. 05/28/21   Veryl Speak, MD  guaiFENesin (MUCINEX) 600 MG 12 hr tablet Take 1 tablet (600 mg total) by mouth 2 (two) times daily. 01/09/21   Couture, Cortni S, PA-C  hydrochlorothiazide (HYDRODIURIL) 25 MG tablet Take 1 tablet (25 mg total) by mouth daily. 07/21/20   Daleen Bo, MD  labetalol (NORMODYNE) 100 MG tablet Take 100 mg by mouth 2 (two) times daily. 04/25/21   [provider]  metFORMIN (GLUCOPHAGE-XR) 500 MG 24 hr tablet Take 500 mg by mouth daily. 07/13/21   [provider]  metroNIDAZOLE (FLAGYL) 500 MG tablet Take 1 tablet (500 mg total) by mouth 2 (two) times daily with a meal. DO NOT CONSUME ALCOHOL WHILE TAKING THIS MEDICATION. 07/01/21   Jaynee Eagles, PA-C  Multiple Vitamin (MULTIVITAMIN WITH MINERALS) TABS tablet Take 1 tablet by mouth daily.    [provider]  naproxen (NAPROSYN) 375 MG tablet Take 1 tablet (375 mg total) by mouth 2 (two) times daily with a meal. 07/01/21   Jaynee Eagles, PA-C  naproxen sodium (ALEVE) 220 MG tablet Take 440 mg by mouth 2 (two) times daily  as needed (pain.headache).    [provider]  ondansetron (ZOFRAN ODT) 4 MG disintegrating tablet Take 1 tablet (4 mg total) by mouth every 8 (eight) hours as needed for nausea or vomiting. Patient not taking: Reported on 07/21/2020 02/21/20   Delia Heady, PA-C  ondansetron (ZOFRAN) 4 MG tablet Take 1 tablet (4 mg total) by mouth every 6 (six) hours. 01/09/21   Couture, Cortni S, PA-C  OZEMPIC, 0.25 OR 0.5 MG/DOSE, 2 MG/1.5ML SOPN SMARTSIG:0.25 Milligram(s) SUB-Q Once a Week 04/25/21   [provider]  tiZANidine (ZANAFLEX) 4 MG tablet Take 1 tablet (4 mg total) by mouth at bedtime. 07/01/21   Jaynee Eagles, PA-C      Allergies    Morphine and related    Review of Systems    Review of Systems  Physical Exam Updated Vital Signs BP (!) 173/124 (BP Location: Right Arm)   Pulse 98   Temp 98.2 F (36.8 C)   Resp 20   Ht '5\' 1"'$  (1.549 m)   Wt 119.7 kg   LMP 05/05/2019 (Approximate)   SpO2 98%   BMI 49.86 kg/m  Physical Exam Constitutional:      General: She is not in acute distress.    Appearance: She is obese.  HENT:     Head: Normocephalic and atraumatic.  Eyes:     Conjunctiva/sclera: Conjunctivae normal.     Pupils: Pupils are equal, round, and reactive to light.  Cardiovascular:     Rate and Rhythm: Normal rate and regular rhythm.  Pulmonary:     Effort: Pulmonary effort is normal. No respiratory distress.  Abdominal:     General: There is no distension.     Tenderness: There is no abdominal tenderness.  Musculoskeletal:     Comments: Left suprascapular tenderness, pain with active or passive range of motion, limited range of motion of the left upper extremity due to pain  Skin:    General: Skin is warm and dry.  Neurological:     General: No focal deficit present.     Mental Status: She is alert. Mental status is at baseline.  Psychiatric:        Mood and Affect: Mood normal.        Behavior: Behavior normal.     ED Results / Procedures / Treatments   Labs (all labs ordered are listed, but only abnormal results are displayed) Labs Reviewed - No data to display  EKG None  Radiology DG Shoulder Left  Result Date: 09/21/2021 CLINICAL DATA:  Pain x3 days EXAM: LEFT SHOULDER - 2+ VIEW COMPARISON:  03/05/2019 FINDINGS: No fracture or dislocation is seen. No abnormal soft tissue calcifications are seen. No significant interval changes are noted. IMPRESSION: No radiographic abnormality is seen in left shoulder. Electronically Signed   By: Elmer Picker M.D.   On: 09/21/2021 13:53    Procedures Procedures    Medications Ordered in ED Medications - No data to display  ED Course/ Medical Decision Making/ A&P                            Medical Decision Making Amount and/or Complexity of Data Reviewed Radiology: ordered.  Risk Prescription drug management.   Patient is here with suspected radiculopathy versus frozen shoulder syndrome versus peripheral neuropathy versus other.  X-rays ordered and personally viewed interpreted, no focal fracture.  No traumatic mechanism.  We will try anti-inflammatories with NSAIDs, prednisone, can also try gabapentin which  may be helpful for neuropathy.  I will refer her to sports medicine initially, as majority of her pain is seems to be localized to the left shoulder.  However did explain to her the possibility that her pain may also be originating from the cervical spine, given the kyphosis that she clearly demonstrates, if she has persistent symptoms she may eventually need advanced imaging of the cervical spine.  An arm sling was provided for comfort.  We did discuss her very elevated blood pressure.  Per my review of external records it appears to be a persistent issue for her.  I strongly emphasized the need to be compliant with her medications daily, she reports did not take her medicines today.  She is otherwise asymptomatic from a hypertension perspective and does not need further workup in the ER, but will she will need close follow-up outpatient.        Final Clinical Impression(s) / ED Diagnoses Final diagnoses:  Acute pain of left shoulder  Hypertension, unspecified type    Rx / DC Orders ED Discharge Orders          Ordered    predniSONE (DELTASONE) 10 MG tablet  Daily with breakfast        09/21/21 1446    gabapentin (NEURONTIN) 300 MG capsule  3 times daily PRN        09/21/21 1446    ibuprofen (ADVIL) 800 MG tablet  Every 8 hours PRN        09/21/21 1446              Temple Sporer, Carola Rhine, MD 09/21/21 1449

## 2021-09-21 NOTE — ED Triage Notes (Signed)
Patient here POV from Home.  Endorses Injuring her Left Shoulder a few years Prior at her Workplace. Since then the Patient will have Intermittent Pain but over the past 3 Days she has had Severe Pain to her Left Shoulder/Left Upper Back that radiates to Left Hand.  Patient does not Recall Trauma but believes she may have injured it lifting a Patient 3 Days ago.   NAD Noted during Triage. A&Ox4. GCS 15. Ambulatory.

## 2021-09-21 NOTE — ED Notes (Signed)
Discharge paperwork given and verbally understood. 

## 2021-10-15 ENCOUNTER — Encounter: Payer: Self-pay | Admitting: Family Medicine

## 2021-10-15 ENCOUNTER — Ambulatory Visit (INDEPENDENT_AMBULATORY_CARE_PROVIDER_SITE_OTHER): Payer: Medicaid Other | Admitting: Family Medicine

## 2021-10-15 VITALS — BP 156/99 | Ht 61.0 in | Wt 263.0 lb

## 2021-10-15 DIAGNOSIS — M5412 Radiculopathy, cervical region: Secondary | ICD-10-CM

## 2021-10-15 MED ORDER — GABAPENTIN 100 MG PO CAPS: 100.0000 mg | ORAL_CAPSULE | Freq: Three times a day (TID) | ORAL | 1 refills | Status: DC

## 2021-10-15 MED ORDER — HYDROCODONE-ACETAMINOPHEN 5-325 MG PO TABS: 1.0000 | ORAL_TABLET | Freq: Three times a day (TID) | ORAL | 0 refills | Status: DC | PRN

## 2021-10-15 NOTE — Patient Instructions (Signed)
Nice to meet you Please try heat  Please try the pain medicine at night.  You can start with the gabapentin at night. I have decreased the dose. Then you can increase to 2 or 3 times daily as you tolerate.  Please try the exercises   Please send me a message in MyChart with any questions or updates.  Please see me back in 3 weeks.   --Dr. Raeford Razor

## 2021-10-15 NOTE — Assessment & Plan Note (Signed)
Acutely occurred.  Symptoms most consistent with a nerve impingement from the cervical spine.  Having radicular symptoms on the left arm.  -Counseled on home exercise therapy and supportive care. -Counseled on ibuprofen. -Gabapentin. -Norco. -Could set up further imaging or physical therapy.

## 2021-10-15 NOTE — Progress Notes (Signed)
  Elaine Perez - 36 y.o. female MRN [id number]  Date of birth: Jul 01, 1985  SUBJECTIVE:  Including CC & ROS.  No chief complaint on file.   Elaine Perez is a 36 y.o. female that is presenting with acute left-sided neck and shoulder pain.  The pain has been ongoing for a few weeks.  The pain is severe in nature.  She is having pain with any shoulder movement.  No history of surgery.  Review of the emergency department note from 8/8 shows she was provided gabapentin, ibuprofen and prednisone. Independent review of the left shoulder x-ray from 8/8 shows no acute changes.  Review of Systems See HPI   HISTORY: Past Medical, Surgical, Social, and Family History Reviewed & Updated per EMR.   Pertinent Historical Findings include:  Past Medical History:  Diagnosis Date   Asthma    Blood transfusion without reported diagnosis 2019   Diabetes mellitus without complication ([place])    Fibroids    Hypertension    Iron deficiency anemia 04/27/2018   Sleep apnea    Does not use CPAP    Past Surgical History:  Procedure Laterality Date   ABDOMINAL HYSTERECTOMY     GALLBLADDER SURGERY     REPEAT CESAREAN SECTION       PHYSICAL EXAM:  VS: BP (!) 156/99 (BP Location: Right Arm, Patient Position: Sitting)   Ht '5\' 1"'$  (1.549 m)   Wt 263 lb (119.3 kg)   LMP 05/05/2019 (Approximate)   BMI 49.69 kg/m  Physical Exam Gen: NAD, alert, cooperative with exam, well-appearing MSK:  Neurovascularly intact       ASSESSMENT & PLAN:   Cervical radiculopathy Acutely occurred.  Symptoms most consistent with a nerve impingement from the cervical spine.  Having radicular symptoms on the left arm.  -Counseled on home exercise therapy and supportive care. -Counseled on ibuprofen. -Gabapentin. -Norco. -Could set up further imaging or physical therapy.

## 2021-11-05 ENCOUNTER — Ambulatory Visit
Admission: EM | Admit: 2021-11-05 | Discharge: 2021-11-05 | Disposition: A | Discharge status: Home / Self Care | Payer: Medicaid Other | Attending: Emergency Medicine | Admitting: Emergency Medicine

## 2021-11-05 DIAGNOSIS — J989 Respiratory disorder, unspecified: Secondary | ICD-10-CM | POA: Diagnosis present

## 2021-11-05 DIAGNOSIS — J4521 Mild intermittent asthma with (acute) exacerbation: Secondary | ICD-10-CM | POA: Insufficient documentation

## 2021-11-05 DIAGNOSIS — Z20822 Contact with and (suspected) exposure to covid-19: Secondary | ICD-10-CM | POA: Insufficient documentation

## 2021-11-05 LAB — RESP PANEL BY RT-PCR (FLU A&B, COVID) ARPGX2
Influenza A by PCR: NEGATIVE
Influenza B by PCR: NEGATIVE
SARS Coronavirus 2 by RT PCR: NEGATIVE

## 2021-11-05 MED ORDER — GUAIFENESIN 400 MG PO TABS
ORAL_TABLET | ORAL | 0 refills | Status: DC
Start: 2021-11-05 — End: 2023-07-06

## 2021-11-05 MED ORDER — METHYLPREDNISOLONE 8 MG PO TABS
8.0000 mg | ORAL_TABLET | Freq: Every day | ORAL | 0 refills | Status: AC
Start: 2021-11-05 — End: 2021-11-08

## 2021-11-05 MED ORDER — AZITHROMYCIN 250 MG PO TABS: ORAL_TABLET | ORAL | 0 refills | Status: AC

## 2021-11-05 MED ORDER — FLUTICASONE PROPIONATE 50 MCG/ACT NA SUSP: 1.0000 | Freq: Every day | NASAL | 2 refills | Status: DC

## 2021-11-05 MED ORDER — TRIAMCINOLONE ACETONIDE 40 MG/ML IJ SUSP
80.0000 mg | Freq: Once | INTRAMUSCULAR | Status: AC
  Administered 2021-11-05: 80 mg via INTRAMUSCULAR

## 2021-11-05 MED ORDER — ALBUTEROL SULFATE HFA 108 (90 BASE) MCG/ACT IN AERS: 2.0000 | INHALATION_SPRAY | Freq: Four times a day (QID) | RESPIRATORY_TRACT | 0 refills | Status: DC | PRN

## 2021-11-05 MED ORDER — PROMETHAZINE-DM 6.25-15 MG/5ML PO SYRP: 5.0000 mL | ORAL_SOLUTION | Freq: Four times a day (QID) | ORAL | 0 refills | Status: DC | PRN

## 2021-11-05 MED ORDER — ALBUTEROL SULFATE (2.5 MG/3ML) 0.083% IN NEBU
2.5000 mg | INHALATION_SOLUTION | Freq: Once | RESPIRATORY_TRACT | Status: AC
  Administered 2021-11-05: 2.5 mg via RESPIRATORY_TRACT

## 2021-11-05 NOTE — Discharge Instructions (Signed)
Your symptoms and physical exam findings are concerning for a viral respiratory infection.     The result of your viral PCR testing will be posted to your MyChart once it is complete, typically this takes 6 to 12 hours.  If either of your results are positive, you will be contacted by phone with further recommendations.  Please see below for list of medications that I recommend to help alleviate your current symptoms:   Kenalog IM (triamcinolone):  To quickly address your significant respiratory inflammation, you were provided with an injection of Kenalog in the office today.  You should continue to feel the full benefit of the steroid for the next 24-36 hours.    Z-Pak (azithromycin):  take 2 tablets the first day, then take 1 tablet daily every day thereafter until complete.   Medrol Dosepak (methylprednisolone): This is a steroid that will significantly calm your upper and lower airways, please take the daily recommended quantity of tablets daily with your breakfast meal starting tomorrow morning until the prescription is complete.      Flonase (fluticasone): This is a steroid nasal spray that you use once daily, 1 spray in each nare.  This medication does not work well if you decide to use it only used as you feel you need to, it works best used on a daily basis.  After 3 to 5 days of use, you will notice significant reduction of the inflammation and mucus production that is currently being caused by exposure to allergens, whether seasonal or environmental.  The most common side effect of this medication is nosebleeds.  If you experience a nosebleed, please discontinue use for 1 week, then feel free to resume.  I have provided you with a prescription.     ProAir, Ventolin, Proventil (albuterol): This inhaled medication contains a short acting beta agonist bronchodilator.  This medication works on the smooth muscle that opens and constricts of your airways by relaxing the muscle.  The result of  relaxation of the smooth muscle is increased air movement and improved work of breathing.  This is a short acting medication that can be used every 4-6 hours as needed for increased work of breathing, shortness of breath, wheezing and excessive coughing.  I have provided you with a prescription.    Robitussin, Mucinex (guaifenesin): This is an expectorant.  This helps break up chest congestion and loosen up thick nasal drainage making phlegm and drainage more liquid and therefore easier to remove.  I recommend being 400 mg three times daily as needed.      Promethazine DM: Promethazine is both a nasal decongestant and an antinausea medication that makes most patients feel fairly sleepy.  The DM is dextromethorphan, a cough suppressant found in many over-the-counter cough medications.  Please take 5 mL before bedtime to minimize your cough which will help you sleep better.  I have sent a prescription for this medication to your pharmacy.   Please follow-up within the next 5-7 days either with your primary care provider or urgent care if your symptoms do not resolve.  If you do not have a primary care provider, we will assist you in finding one.   Thank you for visiting urgent care today.  We appreciate the opportunity to participate in your care.

## 2021-11-05 NOTE — ED Triage Notes (Signed)
The patient states she was exposed to mold at work, she is now having congestion, SOB and headache that started 3-4 days ago.   Home interventions: tylenol, motrin, aleve

## 2021-11-05 NOTE — ED Provider Notes (Signed)
[facility]    CSN: [id number] Arrival date & time: 11/05/21  1323    HISTORY   Chief Complaint  Patient presents with   Shortness of Breath   Headache   Nasal Congestion   HPI Elaine Perez is a pleasant, 36 y.o. female with a known history of poorly controlled type 2 diabetes and hypertension, mild intermittent asthma not on daily maintenance inhalers who presents to urgent care today. Patient complains of 3 to 4 days of congestion, shortness of breath, cough and headache.  Patient states she works at [employer], states that in the area where she was working they were taking down pictures that had mold on them, patient states she is concerned that is what started this.  Patient denies history of known allergy to mold or history of exposure to mold in the past causing similar symptoms.  Patient denies known sick contacts.  Patient states has been taking Tylenol, Motrin, Aleve and using her albuterol inhaler is much as possible without relief of her symptoms.  Patient denies fever, aches, chills, nausea, vomiting, diarrhea, sore throat.  The history is provided by the patient.   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/2019   Iron deficiency anemia due to chronic blood loss 08/31/2018   Iron deficiency anemia 04/27/2018   Leiomyoma of uterus, unspecified 06/15/2016   Anemia, deficiency 05/13/2015   DDD (degenerative disc disease), lumbar 05/16/2013   BMI 40.0-44.9, adult ([place]) 12/17/2012   OSA on CPAP 12/17/2012   Depression 05/11/2012   Diabetes mellitus type 2 in obese ([place]) 12/23/2011    Hypertension 12/23/2011   Female pelvic pain 12/23/2011   Past Surgical History:  Procedure Laterality Date   ABDOMINAL HYSTERECTOMY     GALLBLADDER SURGERY     REPEAT CESAREAN SECTION     OB History     Gravida  3   Para      Term      Preterm      AB  0   Living  3      SAB  0   IAB  0   Ectopic  0   Multiple  0   Live Births  3          Home Medications    Prior to Admission medications   Medication Sig Start Date End Date Taking? Authorizing Provider  losartan (COZAAR) 100 MG tablet Take 100 mg by mouth daily.   Yes [provider]  amLODipine (NORVASC) 10 MG tablet Take 1 tablet (10 mg total) by mouth daily. 09/09/21   Jaynee Eagles, PA-C  carvedilol (COREG) 6.25 MG tablet Take 6.25 mg by mouth 2 (two) times daily. 05/16/20   [provider]  furosemide (LASIX) 20 MG tablet Take 1 tablet (20 mg total) by mouth daily. 05/28/21   Veryl Speak, MD  gabapentin (NEURONTIN) 100 MG capsule Take 1 capsule (100 mg total) by mouth 3 (three) times daily. 10/15/21   Rosemarie Ax, MD  hydrochlorothiazide (HYDRODIURIL) 25 MG tablet Take 1 tablet (25 mg total) by mouth daily. 07/21/20  Daleen Bo, MD  metFORMIN (GLUCOPHAGE-XR) 500 MG 24 hr tablet Take 500 mg by mouth daily. 07/13/21   [provider]  Multiple Vitamin (MULTIVITAMIN WITH MINERALS) TABS tablet Take 1 tablet by mouth daily.    [provider]  OZEMPIC, 0.25 OR 0.5 MG/DOSE, 2 MG/1.5ML SOPN SMARTSIG:0.25 Milligram(s) SUB-Q Once a Week 04/25/21   [provider]    Family History Family History  Problem Relation Age of Onset   Breast cancer Other    Heart disease Mother    Lupus Maternal Aunt    Stomach cancer Maternal Aunt    Colon cancer Neg Hx    Esophageal cancer Neg Hx    Rectal cancer Neg Hx    Social History Social History   Tobacco Use   Smoking status: Never   Smokeless tobacco: Never  Vaping Use   Vaping Use: Never used  Substance Use  Topics   Alcohol use: Never   Drug use: Never   Allergies   Morphine and related  Review of Systems Review of Systems Pertinent findings revealed after performing a 14 point review of systems has been noted in the history of present illness.  Physical Exam Triage Vital Signs ED Triage Vitals  Enc Vitals Group     BP 12/11/20 0827 (!) 147/82     Pulse Rate 12/11/20 0827 72     Resp 12/11/20 0827 18     Temp 12/11/20 0827 98.3 F (36.8 C)     Temp Source 12/11/20 0827 Oral     SpO2 12/11/20 0827 98 %     Weight --      Height --      Head Circumference --      Peak Flow --      Pain Score 12/11/20 0826 5     Pain Loc --      Pain Edu? --      Excl. in [place]? --   No data found.  Updated Vital Signs BP (!) 146/96 (BP Location: Right Arm)   Pulse 87   Temp 98.5 F (36.9 C) (Oral)   Resp 16   LMP 05/05/2019 (Approximate)   SpO2 97%   Physical Exam Vitals and nursing note reviewed.  Constitutional:      General: She is not in acute distress.    Appearance: Normal appearance. She is not ill-appearing.  HENT:     Head: Normocephalic and atraumatic.     Salivary Glands: Right salivary gland is not diffusely enlarged or tender. Left salivary gland is not diffusely enlarged or tender.     Right Ear: Tympanic membrane, ear canal and external ear normal. No drainage. No middle ear effusion. There is no impacted cerumen. Tympanic membrane is not erythematous or bulging.     Left Ear: Tympanic membrane, ear canal and external ear normal. No drainage.  No middle ear effusion. There is no impacted cerumen. Tympanic membrane is not erythematous or bulging.     Nose: Nose normal. No nasal deformity, septal deviation, mucosal edema, congestion or rhinorrhea.     Right Turbinates: Not enlarged, swollen or pale.     Left Turbinates: Not enlarged, swollen or pale.     Right Sinus: No maxillary sinus tenderness or frontal sinus tenderness.     Left Sinus: No maxillary sinus tenderness or  frontal sinus tenderness.     Mouth/Throat:     Lips: Pink. No lesions.     Mouth: Mucous membranes are moist. No oral lesions.  Pharynx: Oropharynx is clear. Uvula midline. No posterior oropharyngeal erythema or uvula swelling.     Tonsils: No tonsillar exudate. 0 on the right. 0 on the left.  Eyes:     General: Lids are normal.        Right eye: No discharge.        Left eye: No discharge.     Extraocular Movements: Extraocular movements intact.     Conjunctiva/sclera: Conjunctivae normal.     Right eye: Right conjunctiva is not injected.     Left eye: Left conjunctiva is not injected.  Neck:     Trachea: Trachea and phonation normal.  Cardiovascular:     Rate and Rhythm: Normal rate and regular rhythm.     Pulses: Normal pulses.     Heart sounds: Normal heart sounds. No murmur heard.    No friction rub. No gallop.  Pulmonary:     Effort: Pulmonary effort is normal. No tachypnea, bradypnea, accessory muscle usage, prolonged expiration, respiratory distress or retractions.     Breath sounds: No stridor, decreased air movement or transmitted upper airway sounds. Examination of the right-upper field reveals decreased breath sounds. Examination of the left-upper field reveals decreased breath sounds. Examination of the right-middle field reveals decreased breath sounds. Examination of the left-middle field reveals decreased breath sounds. Examination of the right-lower field reveals decreased breath sounds. Examination of the left-lower field reveals decreased breath sounds. Decreased breath sounds present. No wheezing, rhonchi or rales.  Chest:     Chest wall: No tenderness.  Musculoskeletal:        General: Normal range of motion.     Cervical back: Normal range of motion and neck supple. Normal range of motion.  Lymphadenopathy:     Cervical: No cervical adenopathy.  Skin:    General: Skin is warm and dry.     Findings: No erythema or rash.  Neurological:     General: No focal  deficit present.     Mental Status: She is alert and oriented to person, place, and time.  Psychiatric:        Mood and Affect: Mood normal.        Behavior: Behavior normal.     Visual Acuity Right Eye Distance:   Left Eye Distance:   Bilateral Distance:    Right Eye Near:   Left Eye Near:    Bilateral Near:     [facility] Couse / Diagnostics / Procedures:     Radiology No results found.  Procedures Procedures (including critical care time) EKG  Pending results:  Labs Reviewed  RESP PANEL BY RT-PCR (FLU A&B, COVID) [id number]    Medications Ordered in UC: Medications  albuterol (PROVENTIL) (2.5 MG/3ML) 0.083% nebulizer solution 2.5 mg (2.5 mg Nebulization Given 11/05/21 1434)  triamcinolone acetonide (KENALOG-40) injection 80 mg (80 mg Intramuscular Given 11/05/21 1434)    UC Diagnoses / Final Clinical Impressions(s)   I have reviewed the triage vital signs and the nursing notes.  Pertinent labs & imaging results that were available during my care of the patient were reviewed by me and considered in my medical decision making (see chart for details).    Final diagnoses:  Respiratory disorder, unspecified  Mild intermittent asthma with (acute) exacerbation   Patient provided with steroid injection and albuterol treatment during her visit today with meaningful improvement of her symptoms prior to discharge.  Patient provided with 3 more days of methylprednisolone and azithromycin for presumed bacterial infection causing worsening symptoms.  Patient also  advised to use Flonase to prevent postnasal drip.  Promethazine DM provided for nighttime cough and Mucinex encouraged as a daytime expectorant.  Return precautions advised.  ED Prescriptions     Medication Sig Dispense Auth. Provider   azithromycin (ZITHROMAX) 250 MG tablet Take 2 tablets (500 mg total) by mouth daily for 1 day, THEN 1 tablet (250 mg total) daily for 4 days. 6 tablet Lynden Oxford Scales, PA-C    methylPREDNISolone (MEDROL) 8 MG tablet Take 1 tablet (8 mg total) by mouth daily for 3 days. 3 tablet Lynden Oxford Scales, PA-C   albuterol (VENTOLIN HFA) 108 (90 Base) MCG/ACT inhaler Inhale 2 puffs into the lungs every 6 (six) hours as needed for wheezing or shortness of breath (Cough). 18 g Lynden Oxford Scales, PA-C   guaifenesin (HUMIBID E) 400 MG TABS tablet Take 1 tablet 3 times daily as needed for chest congestion and cough 21 tablet Lynden Oxford Scales, PA-C   promethazine-dextromethorphan (PROMETHAZINE-DM) 6.25-15 MG/5ML syrup Take 5 mLs by mouth 4 (four) times daily as needed for cough. 118 mL Lynden Oxford Scales, PA-C   fluticasone (FLONASE) 50 MCG/ACT nasal spray Place 1 spray into both nostrils daily. Begin by using 2 sprays in each nare daily for 3 to 5 days, then decrease to 1 spray in each nare daily. 15.8 mL Lynden Oxford Scales, PA-C      PDMP not reviewed this encounter.  Disposition Upon Discharge:  Condition: stable for discharge home Home: take medications as prescribed; routine discharge instructions as discussed; follow up as advised.  Patient presented with an acute illness with associated systemic symptoms and significant discomfort requiring urgent management. In my opinion, this is a condition that a prudent lay person (someone who possesses an average knowledge of health and medicine) may potentially expect to result in complications if not addressed urgently such as respiratory distress, impairment of bodily function or dysfunction of bodily organs.   Routine symptom specific, illness specific and/or disease specific instructions were discussed with the patient and/or caregiver at length.   As such, the patient has been evaluated and assessed, work-up was performed and treatment was provided in alignment with urgent care protocols and evidence based medicine.  Patient/parent/caregiver has been advised that the patient may require follow up for further  testing and treatment if the symptoms continue in spite of treatment, as clinically indicated and appropriate.  If the patient was tested for COVID-19, Influenza and/or RSV, then the patient/parent/guardian was advised to isolate at home pending the results of his/her diagnostic coronavirus test and potentially longer if they're positive. I have also advised pt that if his/her COVID-19 test returns positive, it's recommended to self-isolate for at least 10 days after symptoms first appeared AND until fever-free for 24 hours without fever reducer AND other symptoms have improved or resolved. Discussed self-isolation recommendations as well as instructions for household member/close contacts as per the [facility] and [place] DHHS, and also gave patient the [place] packet with this information.  Patient/parent/caregiver has been advised to return to the [facility] or PCP in 3-5 days if no better; to PCP or the Emergency Department if new signs and symptoms develop, or if the current signs or symptoms continue to change or worsen for further workup, evaluation and treatment as clinically indicated and appropriate  The patient will follow up with their current PCP if and as advised. If the patient does not currently have a PCP we will assist them in obtaining one.   The patient may need  specialty follow up if the symptoms continue, in spite of conservative treatment and management, for further workup, evaluation, consultation and treatment as clinically indicated and appropriate.  Patient/parent/caregiver verbalized understanding and agreement of plan as discussed.  All questions were addressed during visit.  Please see discharge instructions below for further details of plan.  Discharge Instructions:   Discharge Instructions      Your symptoms and physical exam findings are concerning for a viral respiratory infection.     The result of your viral PCR testing will be posted to your MyChart once it is complete, typically  this takes 6 to 12 hours.  If either of your results are positive, you will be contacted by phone with further recommendations.  Please see below for list of medications that I recommend to help alleviate your current symptoms:   Kenalog IM (triamcinolone):  To quickly address your significant respiratory inflammation, you were provided with an injection of Kenalog in the office today.  You should continue to feel the full benefit of the steroid for the next 24-36 hours.    Z-Pak (azithromycin):  take 2 tablets the first day, then take 1 tablet daily every day thereafter until complete.   Medrol Dosepak (methylprednisolone): This is a steroid that will significantly calm your upper and lower airways, please take the daily recommended quantity of tablets daily with your breakfast meal starting tomorrow morning until the prescription is complete.      Flonase (fluticasone): This is a steroid nasal spray that you use once daily, 1 spray in each nare.  This medication does not work well if you decide to use it only used as you feel you need to, it works best used on a daily basis.  After 3 to 5 days of use, you will notice significant reduction of the inflammation and mucus production that is currently being caused by exposure to allergens, whether seasonal or environmental.  The most common side effect of this medication is nosebleeds.  If you experience a nosebleed, please discontinue use for 1 week, then feel free to resume.  I have provided you with a prescription.     ProAir, Ventolin, Proventil (albuterol): This inhaled medication contains a short acting beta agonist bronchodilator.  This medication works on the smooth muscle that opens and constricts of your airways by relaxing the muscle.  The result of relaxation of the smooth muscle is increased air movement and improved work of breathing.  This is a short acting medication that can be used every 4-6 hours as needed for increased work of breathing,  shortness of breath, wheezing and excessive coughing.  I have provided you with a prescription.    Robitussin, Mucinex (guaifenesin): This is an expectorant.  This helps break up chest congestion and loosen up thick nasal drainage making phlegm and drainage more liquid and therefore easier to remove.  I recommend being 400 mg three times daily as needed.      Promethazine DM: Promethazine is both a nasal decongestant and an antinausea medication that makes most patients feel fairly sleepy.  The DM is dextromethorphan, a cough suppressant found in many over-the-counter cough medications.  Please take 5 mL before bedtime to minimize your cough which will help you sleep better.  I have sent a prescription for this medication to your pharmacy.   Please follow-up within the next 5-7 days either with your primary care provider or urgent care if your symptoms do not resolve.  If you do not have a  primary care provider, we will assist you in finding one.   Thank you for visiting urgent care today.  We appreciate the opportunity to participate in your care.       This office note has been dictated using Museum/gallery curator.  Unfortunately, this method of dictation can sometimes lead to typographical or grammatical errors.  I apologize for your inconvenience in advance if this occurs.  Please do not hesitate to reach out to me if clarification is needed.      Lynden Oxford Scales, [place] 11/05/21 1923

## 2021-11-09 ENCOUNTER — Ambulatory Visit: Payer: Medicaid Other | Admitting: Family Medicine

## 2022-01-04 ENCOUNTER — Ambulatory Visit
Admission: RE | Admit: 2022-01-04 | Discharge: 2022-01-04 | Disposition: A | Discharge status: Home / Self Care | Payer: Medicaid Other | Source: Ambulatory Visit | Attending: Urgent Care | Admitting: Urgent Care

## 2022-01-04 VITALS — BP 139/86 | HR 84 | Temp 98.6°F | Resp 18

## 2022-01-04 DIAGNOSIS — E1165 Type 2 diabetes mellitus with hyperglycemia: Secondary | ICD-10-CM

## 2022-01-04 DIAGNOSIS — R739 Hyperglycemia, unspecified: Secondary | ICD-10-CM

## 2022-01-04 DIAGNOSIS — E119 Type 2 diabetes mellitus without complications: Secondary | ICD-10-CM | POA: Diagnosis not present

## 2022-01-04 DIAGNOSIS — R109 Unspecified abdominal pain: Secondary | ICD-10-CM

## 2022-01-04 DIAGNOSIS — M549 Dorsalgia, unspecified: Secondary | ICD-10-CM | POA: Diagnosis not present

## 2022-01-04 DIAGNOSIS — M5442 Lumbago with sciatica, left side: Secondary | ICD-10-CM | POA: Diagnosis not present

## 2022-01-04 MED ORDER — NAPROXEN 500 MG PO TABS: 500.0000 mg | ORAL_TABLET | Freq: Two times a day (BID) | ORAL | 0 refills | Status: DC

## 2022-01-04 MED ORDER — TIZANIDINE HCL 4 MG PO TABS: 4.0000 mg | ORAL_TABLET | Freq: Every day | ORAL | 0 refills | Status: DC

## 2022-01-04 NOTE — ED Triage Notes (Signed)
Pt c/o left lower back pain that stated while walking 2 days ago-pain worse with movement-denies injury-NAD-steady gait

## 2022-01-04 NOTE — ED Provider Notes (Signed)
[facility]  Note:  This document was prepared using Systems analyst and may include unintentional dictation errors.  MRN: [id number] DOB: Mar 26, 1985  Subjective:   Elaine Perez is a 36 y.o. female presenting for 2-day history of acute onset persistent left-sided low back pain that radiates anteriorly toward the abdomen.  No fever, nausea, vomiting, dysuria, hematuria, radicular symptoms, weakness, numbness or tingling, changes to bowel or urinary habits.  No fall, trauma.  Patient does a lot of standing for work, she also does a lot of lifting.  Tries to practice good ergonomics.  Has a history of degenerative disc disease of the lumbar region in her medical chart but patient is unaware of how that diagnosis came about.  She has poorly controlled diabetes with an A1c greater than 11% and is currently being treated without insulin.  Does not hydrate well with water.  No current facility-administered medications for this encounter.  Current Outpatient Medications:    albuterol (VENTOLIN HFA) 108 (90 Base) MCG/ACT inhaler, Inhale 2 puffs into the lungs every 6 (six) hours as needed for wheezing or shortness of breath (Cough)., Disp: 18 g, Rfl: 0   amLODipine (NORVASC) 10 MG tablet, Take 1 tablet (10 mg total) by mouth daily., Disp: 90 tablet, Rfl: 0   carvedilol (COREG) 6.25 MG tablet, Take 6.25 mg by mouth 2 (two) times daily., Disp: , Rfl:    fluticasone (FLONASE) 50 MCG/ACT nasal spray, Place 1 spray into both nostrils daily. Begin by using 2 sprays in each nare daily for 3 to 5 days, then decrease to 1 spray in each nare daily., Disp: 15.8 mL, Rfl: 2   furosemide (LASIX) 20 MG tablet, Take 1 tablet (20 mg total) by mouth daily., Disp: 30 tablet, Rfl: 0   gabapentin (NEURONTIN) 100 MG capsule, Take 1 capsule (100 mg total) by mouth 3 (three) times daily., Disp: 60 capsule, Rfl: 1   guaifenesin (HUMIBID E) 400 MG TABS tablet, Take 1 tablet 3 times daily  as needed for chest congestion and cough, Disp: 21 tablet, Rfl: 0   hydrochlorothiazide (HYDRODIURIL) 25 MG tablet, Take 1 tablet (25 mg total) by mouth daily., Disp: 30 tablet, Rfl: 1   losartan (COZAAR) 100 MG tablet, Take 100 mg by mouth daily., Disp: , Rfl:    metFORMIN (GLUCOPHAGE-XR) 500 MG 24 hr tablet, Take 500 mg by mouth daily., Disp: , Rfl:    Multiple Vitamin (MULTIVITAMIN WITH MINERALS) TABS tablet, Take 1 tablet by mouth daily., Disp: , Rfl:    OZEMPIC, 0.25 OR 0.5 MG/DOSE, 2 MG/1.5ML SOPN, SMARTSIG:0.25 Milligram(s) SUB-Q Once a Week, Disp: , Rfl:    promethazine-dextromethorphan (PROMETHAZINE-DM) 6.25-15 MG/5ML syrup, Take 5 mLs by mouth 4 (four) times daily as needed for cough., Disp: 118 mL, Rfl: 0   Allergies  Allergen Reactions   Morphine And Related Itching and Rash    swelling swelling swelling swelling     Past Medical History:  Diagnosis Date   Asthma    Blood transfusion without reported diagnosis 2019   Diabetes mellitus without complication (HCC)    Fibroids    Hypertension    Iron deficiency anemia 04/27/2018   Sleep apnea    Does not use CPAP     Past Surgical History:  Procedure Laterality Date   ABDOMINAL HYSTERECTOMY     GALLBLADDER SURGERY     REPEAT CESAREAN SECTION      Family History  Problem Relation Age of Onset   Breast  cancer Other    Heart disease Mother    Lupus Maternal Aunt    Stomach cancer Maternal Aunt    Colon cancer Neg Hx    Esophageal cancer Neg Hx    Rectal cancer Neg Hx     Social History   Tobacco Use   Smoking status: Never   Smokeless tobacco: Never  Vaping Use   Vaping Use: Never used  Substance Use Topics   Alcohol use: Never   Drug use: Never    ROS   Objective:   Vitals: BP 139/86 (BP Location: Left Arm)   Pulse 84   Temp 98.6 F (37 C) (Oral)   Resp 18   LMP 05/05/2019 (Approximate)   SpO2 96%   Physical Exam Constitutional:      General: She is not in acute distress.     Appearance: Normal appearance. She is well-developed. She is not ill-appearing, toxic-appearing or diaphoretic.  HENT:     Head: Normocephalic and atraumatic.     Nose: Nose normal.     Mouth/Throat:     Mouth: Mucous membranes are moist.  Eyes:     General: No scleral icterus.       Right eye: No discharge.        Left eye: No discharge.     Extraocular Movements: Extraocular movements intact.  Cardiovascular:     Rate and Rhythm: Normal rate.  Pulmonary:     Effort: Pulmonary effort is normal.  Musculoskeletal:     Thoracic back: Spasms and tenderness present. No swelling, edema, deformity, signs of trauma, lacerations or bony tenderness. Normal range of motion. No scoliosis.     Lumbar back: Spasms and tenderness present. No swelling, edema, deformity, signs of trauma, lacerations or bony tenderness. Decreased range of motion. Negative right straight leg raise test and negative left straight leg raise test. No scoliosis.       Back:  Skin:    General: Skin is warm and dry.  Neurological:     General: No focal deficit present.     Mental Status: She is alert and oriented to person, place, and time.  Psychiatric:        Mood and Affect: Mood normal.        Behavior: Behavior normal.     Assessment and Plan :   PDMP not reviewed this encounter.  1. Acute left-sided back pain, unspecified back location   2. Left flank pain   3. Type 2 diabetes mellitus treated without insulin ([place])   4. Hyperglycemia     Given lack of urinary symptoms, deferred urinalysis.  Recommended conservative management using naproxen, tizanidine for musculoskeletal back pain.  Deferred imaging given no midline tenderness, fall, trauma.  Advised patient be on the look out for a rash in the event that this turns out to be shingles.  Counseled patient on potential for adverse effects with medications prescribed/recommended today, ER and return-to-clinic precautions discussed, patient verbalized  understanding.    Jaynee Eagles, [place] 01/04/22 [phone]

## 2022-01-08 IMAGING — DX DG KNEE COMPLETE 4+V*R*
1 series · 4 of 4 positions shown · non-contrast
Comparison: None.

CLINICAL DATA: Right knee pain.

EXAM:
RIGHT KNEE - COMPLETE 4+ VIEW

[Series 1: knee · 0.14mm/px · 4 of 4 slices shown]
[im 1/4]
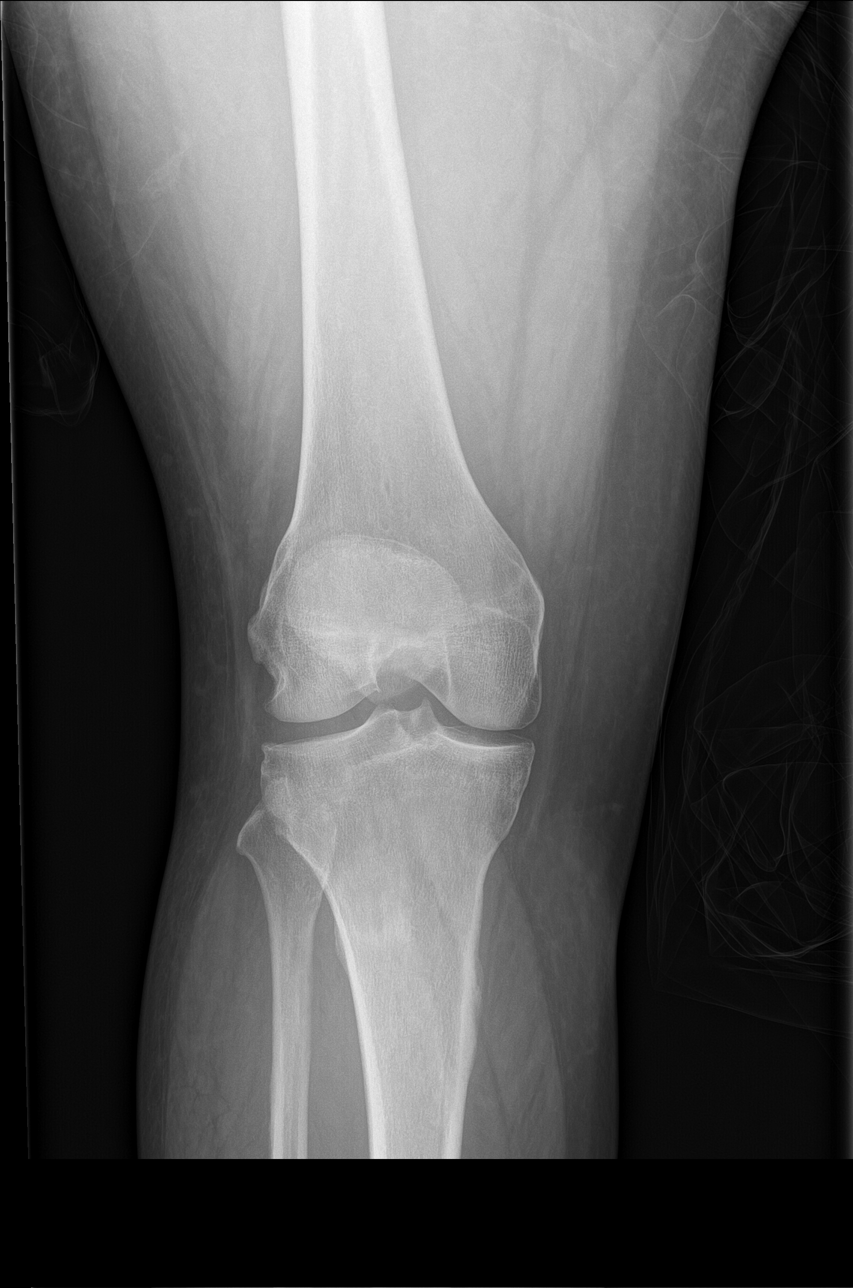
[im 2/4]
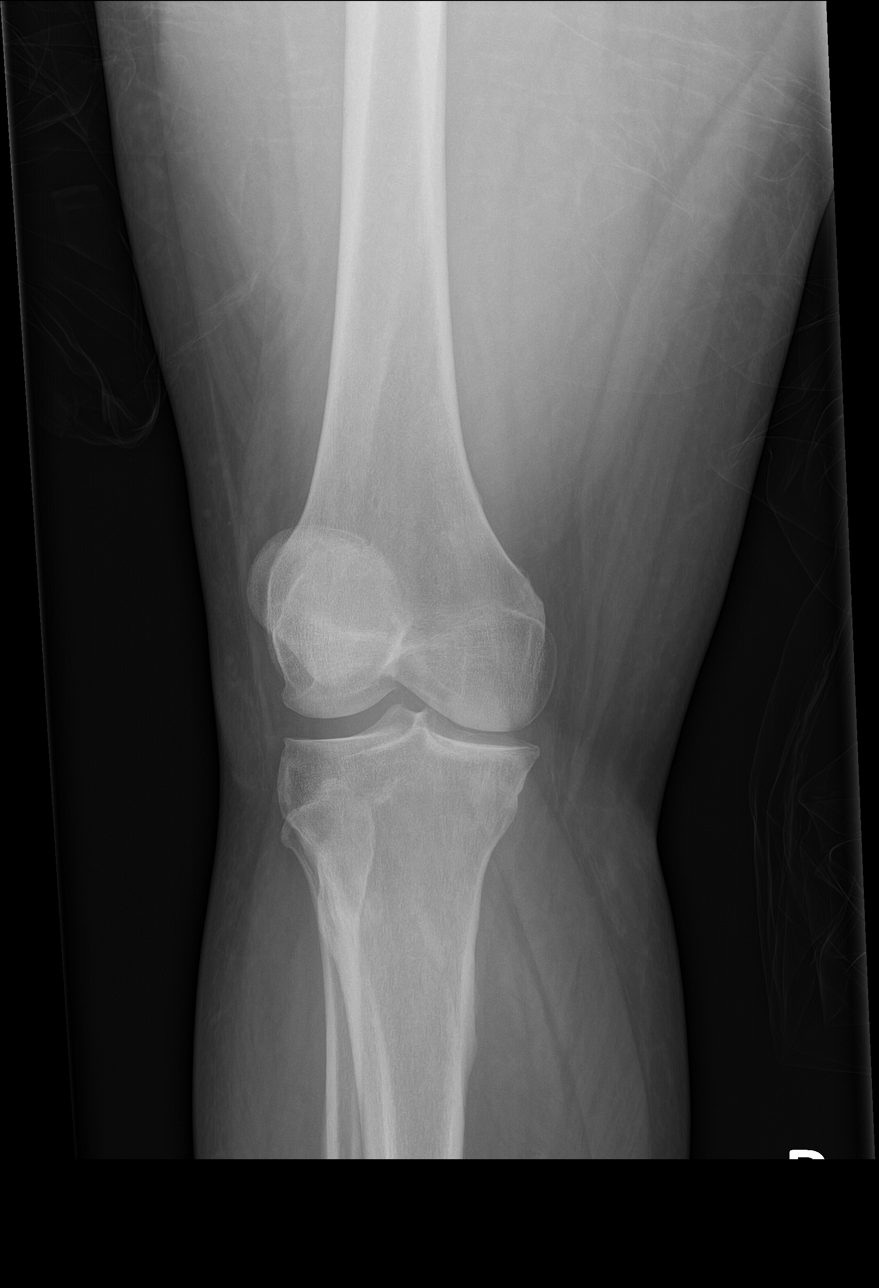
[im 3/4]
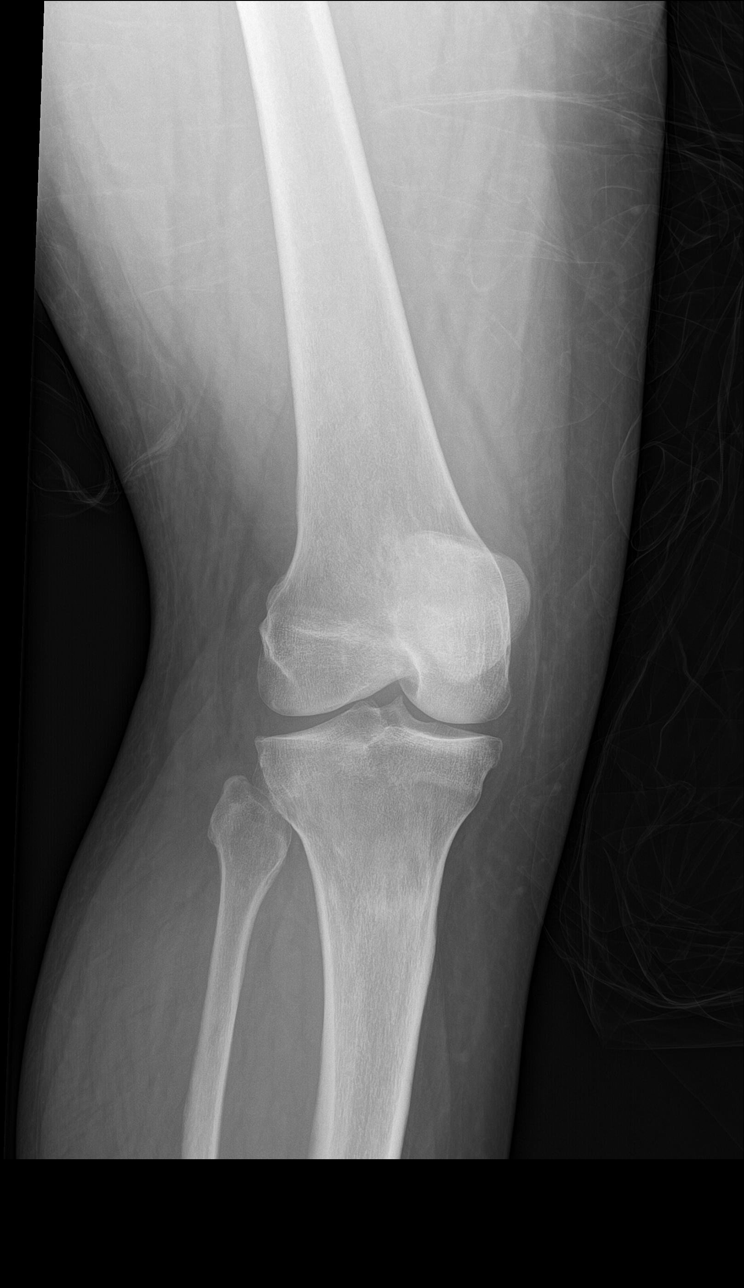
[im 4/4]
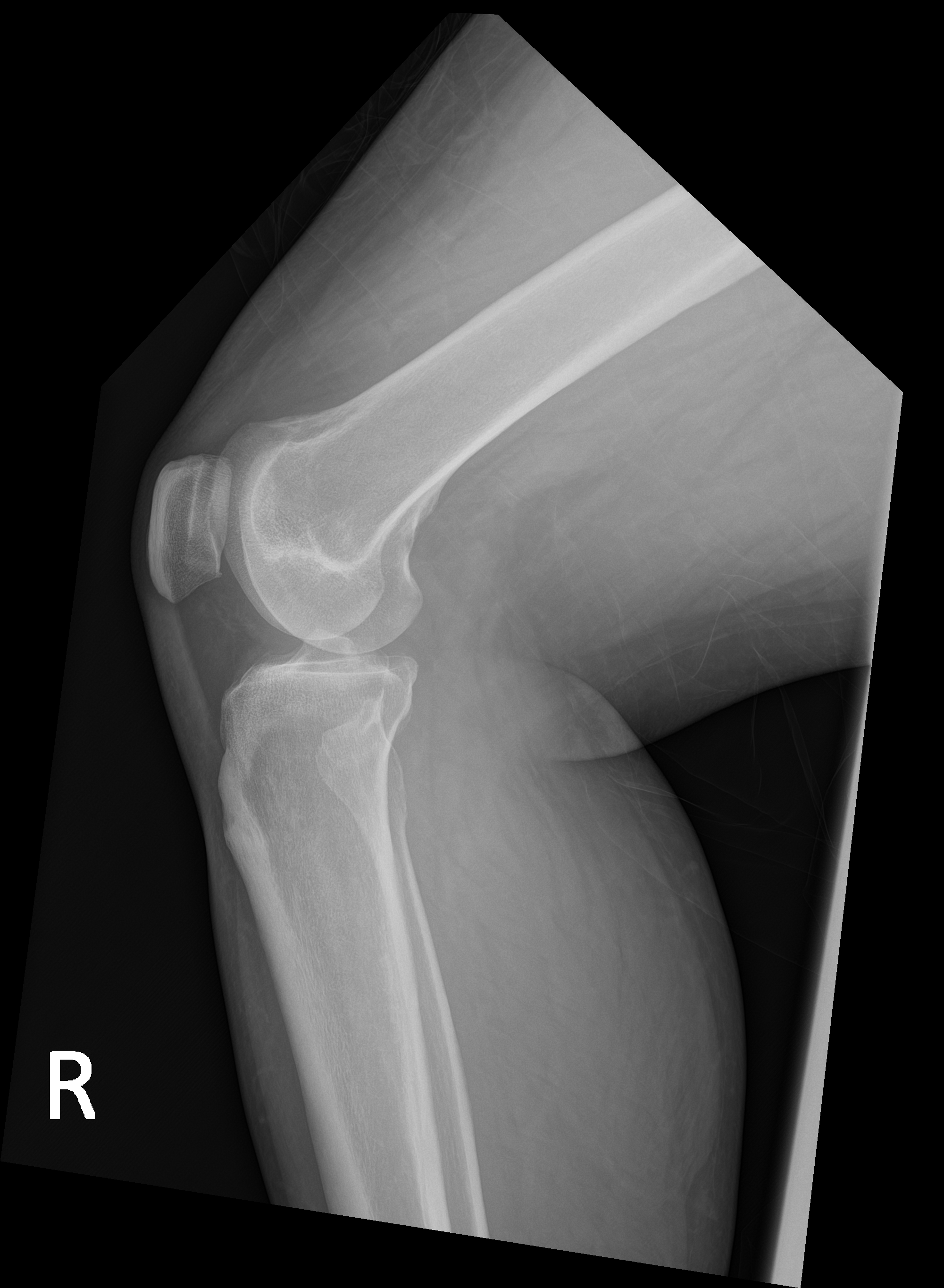

[4 of 4 positions shown; findings below may reference images not displayed]

FINDINGS: There is no acute fracture or dislocation. The bones are well
mineralized. No arthritic changes. No joint effusion. The soft
tissues are unremarkable.
IMPRESSION: Negative.

## 2022-01-08 IMAGING — US US EXTREM LOW VENOUS*R*
1 series · 14 of 24 positions shown · non-contrast
Comparison: None.

CLINICAL DATA: Right lower extremity swelling.  Rule out DVT.

EXAM:
Right LOWER EXTREMITY VENOUS DOPPLER ULTRASOUND
TECHNIQUE: Gray-scale sonography with compression, as well as color and duplex
ultrasound, were performed to evaluate the deep venous system(s)
from the level of the common femoral vein through the popliteal and
proximal calf veins.

[Series 1: us venous img lower uni right (dvt) · portal-venous · 14 of 32 slices shown]
[im 1/32]
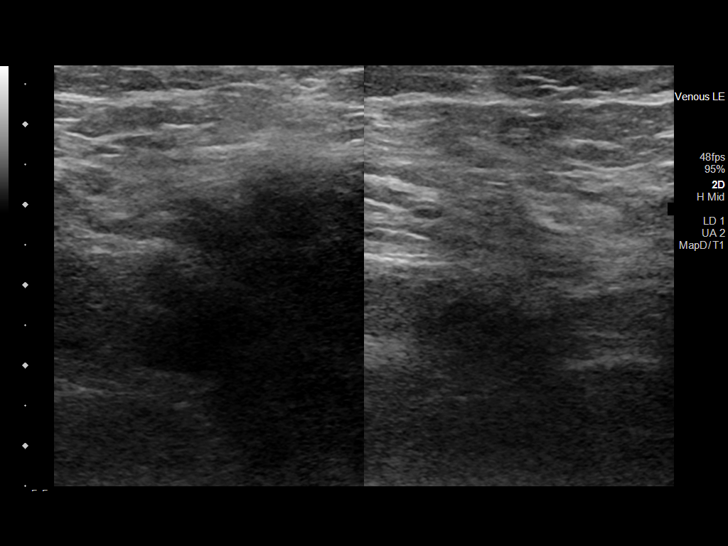
[im 3/32]
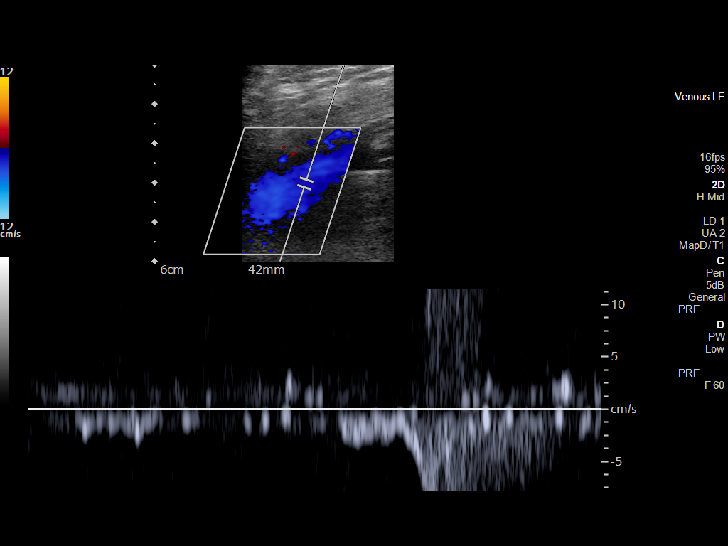
[im 6/32]
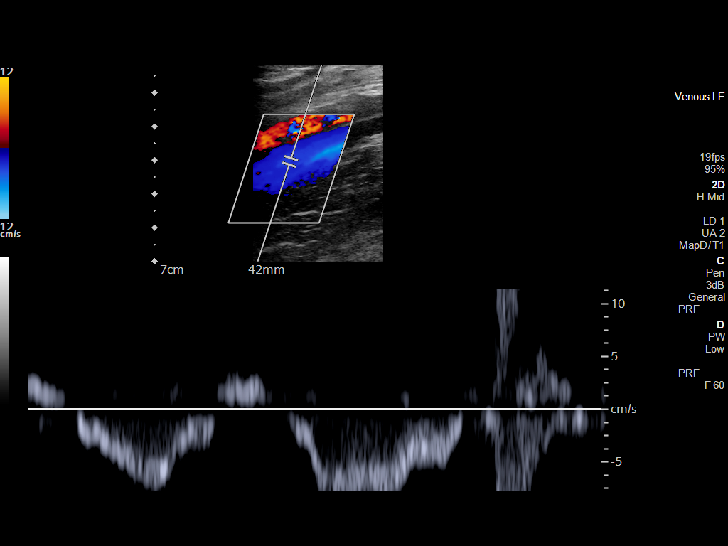
[im 9/32]
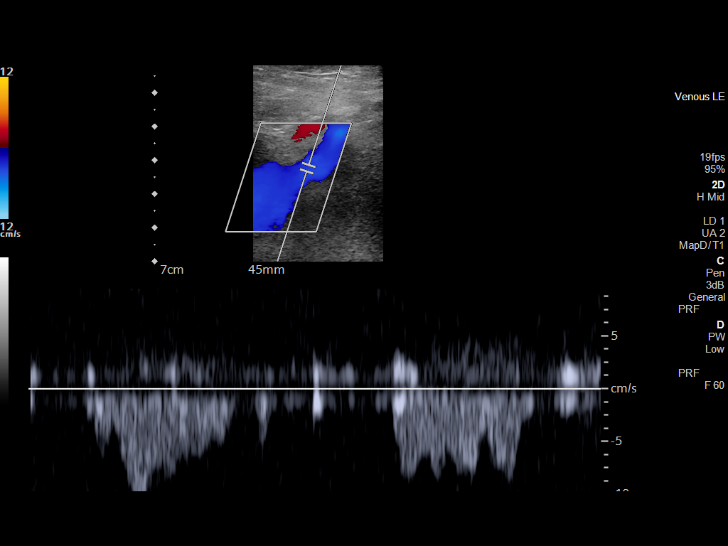
[im 10/32]
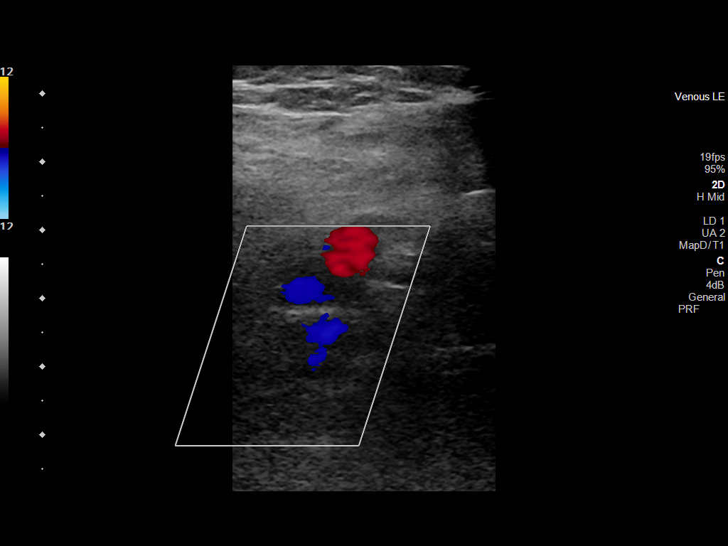
[im 13/32]
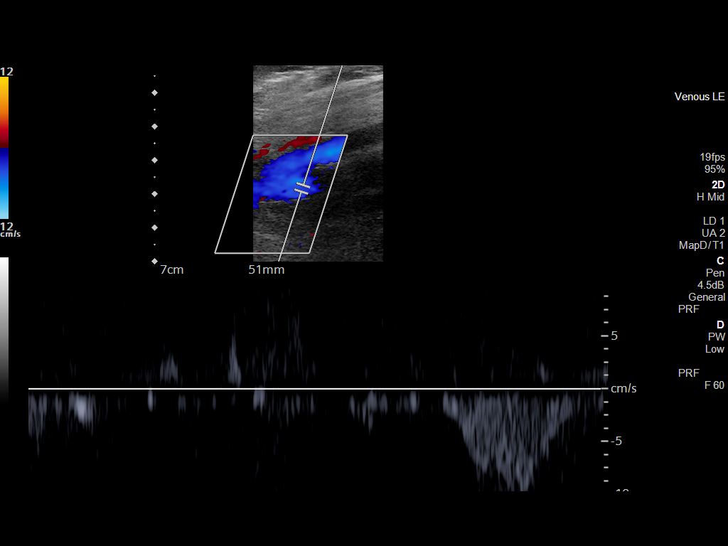
[im 15/32]
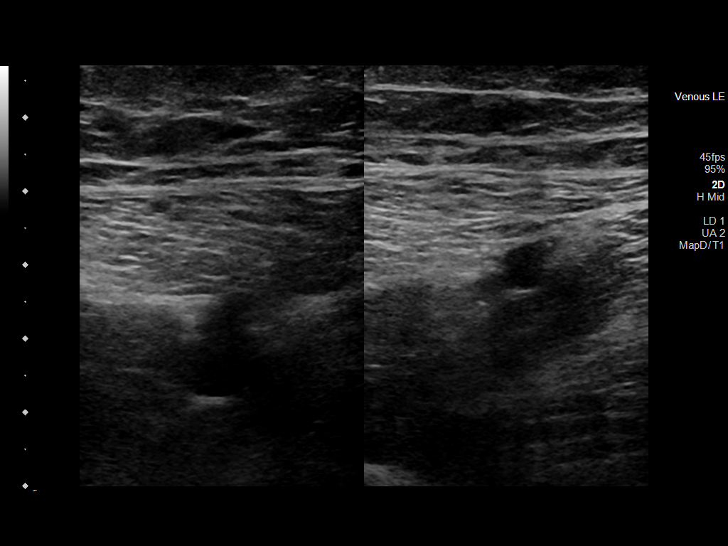
[im 17/32]
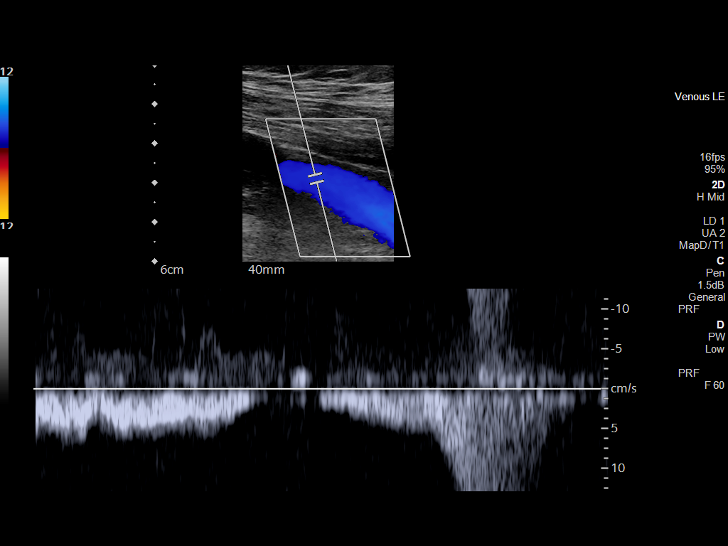
[im 19/32]
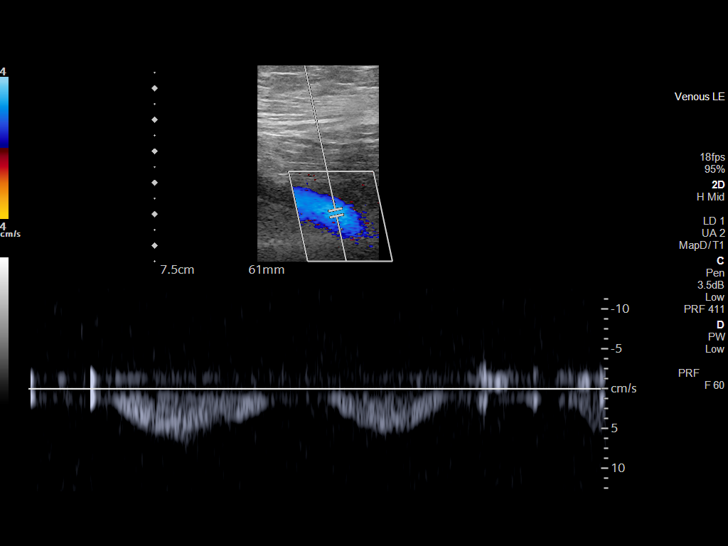
[im 22/32]
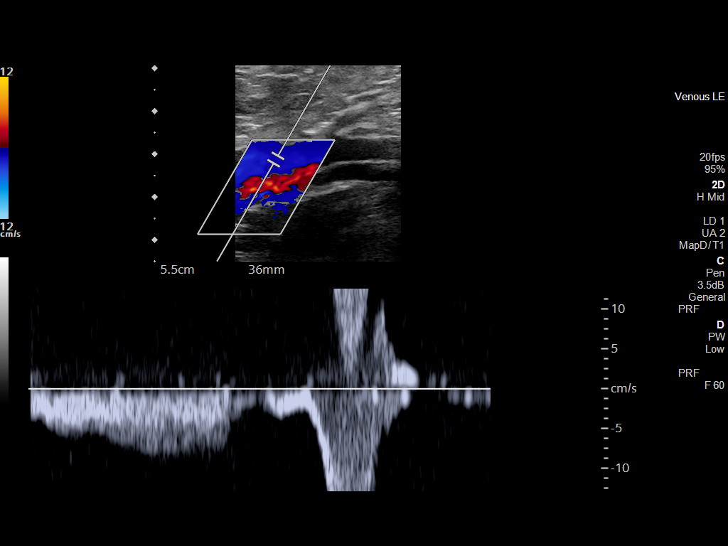
[im 25/32]
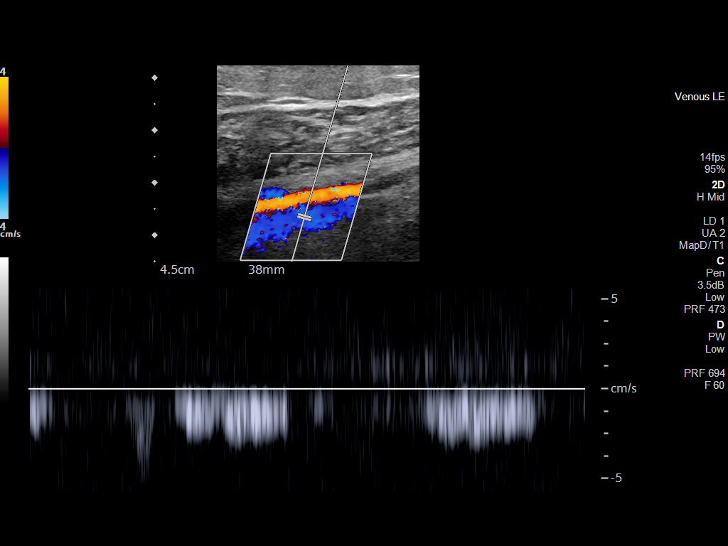
[im 26/32]
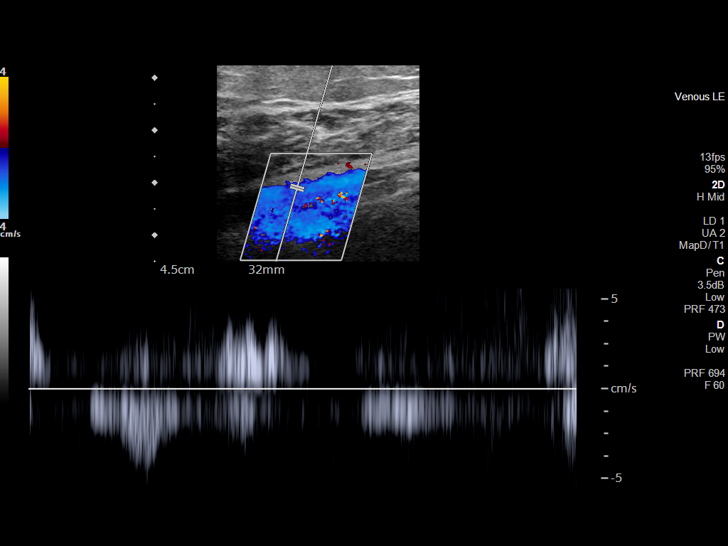
[im 29/32]
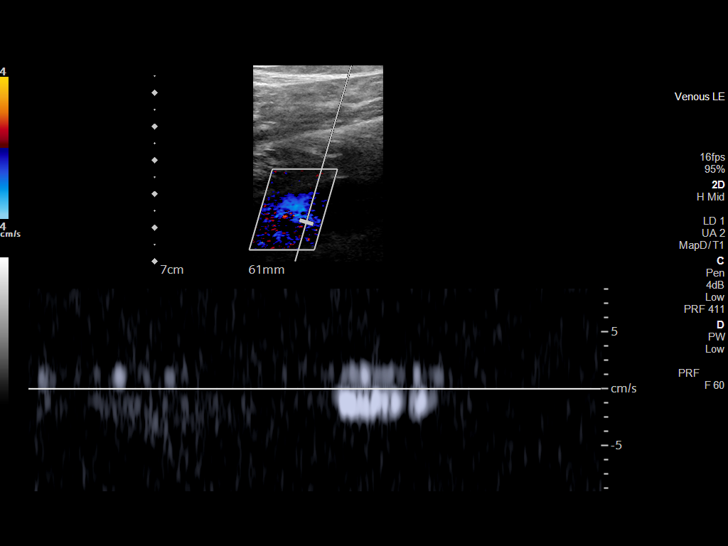
[im 32/32]
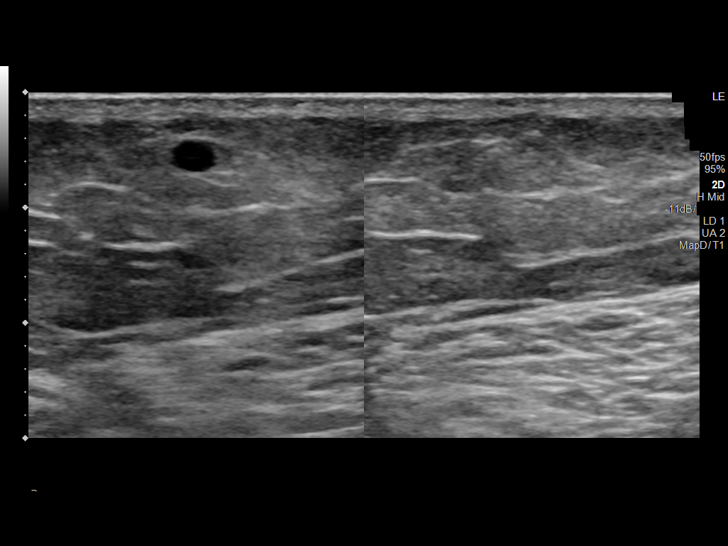

[14 of 24 positions shown; findings below may reference images not displayed]

FINDINGS: VENOUS

Normal compressibility of the common femoral, superficial femoral,
and popliteal veins, as well as the visualized calf veins.
Visualized portions of profunda femoral vein and great saphenous
vein unremarkable. No filling defects to suggest DVT on grayscale or
color Doppler imaging. Doppler waveforms show normal direction of
venous flow, normal respiratory plasticity and response to
augmentation.

Limited views of the contralateral common femoral vein are
unremarkable.

OTHER

None.

Limitations: none
IMPRESSION: Negative.

## 2022-02-13 ENCOUNTER — Other Ambulatory Visit: Payer: Self-pay

## 2022-02-13 ENCOUNTER — Encounter (HOSPITAL_BASED_OUTPATIENT_CLINIC_OR_DEPARTMENT_OTHER): Payer: Self-pay | Admitting: Emergency Medicine

## 2022-02-13 ENCOUNTER — Emergency Department (HOSPITAL_BASED_OUTPATIENT_CLINIC_OR_DEPARTMENT_OTHER)
Admission: EM | Admit: 2022-02-13 | Discharge: 2022-02-13 | Disposition: A | Discharge status: Home / Self Care | Payer: Medicaid Other | Attending: Emergency Medicine | Admitting: Emergency Medicine

## 2022-02-13 DIAGNOSIS — J45909 Unspecified asthma, uncomplicated: Secondary | ICD-10-CM | POA: Insufficient documentation

## 2022-02-13 DIAGNOSIS — Z7984 Long term (current) use of oral hypoglycemic drugs: Secondary | ICD-10-CM | POA: Diagnosis not present

## 2022-02-13 DIAGNOSIS — T7840XA Allergy, unspecified, initial encounter: Secondary | ICD-10-CM | POA: Insufficient documentation

## 2022-02-13 DIAGNOSIS — I10 Essential (primary) hypertension: Secondary | ICD-10-CM | POA: Insufficient documentation

## 2022-02-13 DIAGNOSIS — Z79899 Other long term (current) drug therapy: Secondary | ICD-10-CM | POA: Insufficient documentation

## 2022-02-13 DIAGNOSIS — E1165 Type 2 diabetes mellitus with hyperglycemia: Secondary | ICD-10-CM | POA: Insufficient documentation

## 2022-02-13 LAB — CBC
HCT: 45.8 % (ref 36.0–46.0)
Hemoglobin: 15.1 g/dL — ABNORMAL HIGH (ref 12.0–15.0)
MCH: 29.5 pg (ref 26.0–34.0)
MCHC: 33 g/dL (ref 30.0–36.0)
MCV: 89.6 fL (ref 80.0–100.0)
Platelets: 290 10*3/uL (ref 150–400)
RBC: 5.11 MIL/uL (ref 3.87–5.11)
RDW: 12.5 % (ref 11.5–15.5)
WBC: 9.1 10*3/uL (ref 4.0–10.5)
nRBC: 0 % (ref 0.0–0.2)

## 2022-02-13 LAB — BASIC METABOLIC PANEL
Anion gap: 10 (ref 5–15)
BUN: 14 mg/dL (ref 6–20)
CO2: 25 mmol/L (ref 22–32)
Calcium: 9.6 mg/dL (ref 8.9–10.3)
Chloride: 101 mmol/L (ref 98–111)
Creatinine, Ser: 0.77 mg/dL (ref 0.44–1.00)
GFR, Estimated: >60 mL/min (ref >60)
Glucose, Bld: 239 mg/dL — ABNORMAL HIGH (ref 70–99)
Potassium: 3.6 mmol/L (ref 3.5–5.1)
Sodium: 136 mmol/L (ref 135–145)

## 2022-02-13 LAB — CBG MONITORING, ED: Glucose-Capillary: 233 mg/dL — ABNORMAL HIGH (ref 70–99)

## 2022-02-13 MED ORDER — METHYLPREDNISOLONE SODIUM SUCC 125 MG IJ SOLR
125.0000 mg | Freq: Every day | INTRAMUSCULAR | Status: AC
Start: 2022-02-13 — End: 2022-02-13
  Administered 2022-02-13: 125 mg via INTRAVENOUS
  Filled 2022-02-13: qty 2

## 2022-02-13 MED ORDER — PREDNISONE 10 MG PO TABS
40.0000 mg | ORAL_TABLET | Freq: Every day | ORAL | 0 refills | Status: AC
  Filled 2022-02-13: qty 16, 4d supply, fill #0

## 2022-02-13 MED ORDER — EPINEPHRINE 0.3 MG/0.3ML IJ SOAJ
0.3000 mg | INTRAMUSCULAR | 0 refills | Status: AC | PRN
  Filled 2022-02-13: qty 2, 1d supply, fill #0

## 2022-02-13 MED ORDER — DIPHENHYDRAMINE HCL 50 MG/ML IJ SOLN
50.0000 mg | Freq: Once | INTRAMUSCULAR | Status: AC
Start: 2022-02-13 — End: 2022-02-13
  Administered 2022-02-13: 50 mg via INTRAVENOUS
  Filled 2022-02-13: qty 1

## 2022-02-13 NOTE — ED Triage Notes (Signed)
Pt via pov from home with itching that began this morning around 8AM and got worse in the the last hour or two. She reports that she is itching and feels like it is in her throat as well. She took '25mg'$  benadryl at [facility] or maybe a bit earlier. Pt is scratching all over during triage. Skin is red.  Pt alert & oriented, nad noted.

## 2022-02-13 NOTE — ED Provider Notes (Signed)
[facility] EMERGENCY DEPT Provider Note   CSN: [id number] Arrival date & time: 02/13/22  1555     History  Chief Complaint  Patient presents with   Allergic Reaction    Elaine Perez is a 36 y.o. female. She has history of asthma and diabetes, she has previous allergic reaction to morphine in the past.  She presents today complaining of allergic reaction.  States she woke up this morning with some runny nose and developed itching throughout the day that has gotten worse of her whole body.  She states she did use 1 puff of her inhaler which resolved any shortness of breath.  She denies nausea vomiting diarrhea, no chest pain or shortness of breath, no trouble swallowing or breathing, no swelling of the face lips or tongue she denies any new foods medications lotions detergents or other exposures that could have triggered this reaction  Allergic Reaction Presenting symptoms: rash        Home Medications Prior to Admission medications   Medication Sig Start Date End Date Taking? Authorizing Provider  EPINEPHrine 0.3 mg/0.3 mL IJ SOAJ injection Inject 0.3 mg into the muscle as needed for anaphylaxis. 02/13/22  Yes General Wearing A, PA-C  predniSONE (DELTASONE) 10 MG tablet Take 4 tablets (40 mg total) by mouth daily for 4 days. 02/13/22 02/17/22 Yes Josiyah Tozzi A, PA-C  albuterol (VENTOLIN HFA) 108 (90 Base) MCG/ACT inhaler Inhale 2 puffs into the lungs every 6 (six) hours as needed for wheezing or shortness of breath (Cough). 11/05/21   Lynden Oxford Scales, PA-C  amLODipine (NORVASC) 10 MG tablet Take 1 tablet (10 mg total) by mouth daily. 09/09/21   Jaynee Eagles, PA-C  carvedilol (COREG) 6.25 MG tablet Take 6.25 mg by mouth 2 (two) times daily. 05/16/20   [provider]  fluticasone (FLONASE) 50 MCG/ACT nasal spray Place 1 spray into both nostrils daily. Begin by using 2 sprays in each nare daily for 3 to 5 days, then decrease to 1 spray in each nare daily.  11/05/21   Lynden Oxford Scales, PA-C  furosemide (LASIX) 20 MG tablet Take 1 tablet (20 mg total) by mouth daily. 05/28/21   Veryl Speak, MD  gabapentin (NEURONTIN) 100 MG capsule Take 1 capsule (100 mg total) by mouth 3 (three) times daily. 10/15/21   Rosemarie Ax, MD  guaifenesin (HUMIBID E) 400 MG TABS tablet Take 1 tablet 3 times daily as needed for chest congestion and cough 11/05/21   Lynden Oxford Scales, PA-C  hydrochlorothiazide (HYDRODIURIL) 25 MG tablet Take 1 tablet (25 mg total) by mouth daily. 07/21/20   Daleen Bo, MD  losartan (COZAAR) 100 MG tablet Take 100 mg by mouth daily.    [provider]  metFORMIN (GLUCOPHAGE-XR) 500 MG 24 hr tablet Take 500 mg by mouth daily. 07/13/21   [provider]  Multiple Vitamin (MULTIVITAMIN WITH MINERALS) TABS tablet Take 1 tablet by mouth daily.    [provider]  naproxen (NAPROSYN) 500 MG tablet Take 1 tablet (500 mg total) by mouth 2 (two) times daily with a meal. 01/04/22   Jaynee Eagles, PA-C  OZEMPIC, 0.25 OR 0.5 MG/DOSE, 2 MG/1.5ML SOPN SMARTSIG:0.25 Milligram(s) SUB-Q Once a Week 04/25/21   [provider]  promethazine-dextromethorphan (PROMETHAZINE-DM) 6.25-15 MG/5ML syrup Take 5 mLs by mouth 4 (four) times daily as needed for cough. 11/05/21   Lynden Oxford Scales, PA-C  tiZANidine (ZANAFLEX) 4 MG tablet Take 1 tablet (4 mg total) by mouth at bedtime. 01/04/22  Jaynee Eagles, PA-C      Allergies    Morphine and related    Review of Systems   Review of Systems  Skin:  Positive for rash.    Physical Exam Updated Vital Signs BP 133/76   Pulse 80   Temp 98.5 F (36.9 C) (Oral)   Resp 17   Ht '5\' 1"'$  (1.549 m)   Wt 108.9 kg   LMP 05/05/2019 (Approximate)   SpO2 98%   BMI 45.35 kg/m  Physical Exam Vitals and nursing note reviewed.  Constitutional:      General: She is not in acute distress.    Appearance: She is well-developed. She is obese.     Comments: Uncomfortable   HENT:      Head: Normocephalic and atraumatic.     Nose: Nose normal.     Mouth/Throat:     Mouth: Mucous membranes are moist.     Comments: There is no pharyngeal swelling, no swelling of the tongue.  Patient speaks in full and clear sentences and handles her oral secretions well Eyes:     Conjunctiva/sclera: Conjunctivae normal.  Cardiovascular:     Rate and Rhythm: Normal rate and regular rhythm.     Heart sounds: No murmur heard. Pulmonary:     Effort: Pulmonary effort is normal. No respiratory distress.     Breath sounds: Normal breath sounds.  Abdominal:     Palpations: Abdomen is soft.     Tenderness: There is no abdominal tenderness.  Musculoskeletal:        General: No swelling.     Cervical back: Neck supple.  Skin:    General: Skin is warm and dry.     Capillary Refill: Capillary refill takes less than 2 seconds.  Neurological:     General: No focal deficit present.     Mental Status: She is alert. She is disoriented.  Psychiatric:        Mood and Affect: Mood normal.        Behavior: Behavior normal.     ED Results / Procedures / Treatments   Labs (all labs ordered are listed, but only abnormal results are displayed) Labs Reviewed  CBC - Abnormal; Notable for the following components:      Result Value   Hemoglobin 15.1 (*)    All other components within normal limits  BASIC METABOLIC PANEL - Abnormal; Notable for the following components:   Glucose, Bld 239 (*)    All other components within normal limits  CBG MONITORING, ED - Abnormal; Notable for the following components:   Glucose-Capillary 233 (*)    All other components within normal limits    EKG None  Radiology No results found.  Procedures Procedures    Medications Ordered in ED Medications  diphenhydrAMINE (BENADRYL) injection 50 mg (50 mg Intravenous Given 02/13/22 1815)  methylPREDNISolone sodium succinate (SOLU-MEDROL) 125 mg/2 mL injection 125 mg (125 mg Intravenous Given 02/13/22 1817)     ED Course/ Medical Decision Making/ A&P                           Medical Decision Making This patient presents to the ED for concern of allergic reaction with rash and itching, this involves an extensive number of treatment options, and is a complaint that carries with it a high risk of complications and morbidity.  The differential diagnosis includes anaphylaxis, allergic urticaria, drug hypersensitivity, contact dermatitis, eczema, other   Co morbidities  that complicate the patient evaluation  [place], hypertension   Additional history obtained:  Additional history obtained from EMR External records from outside source obtained and reviewed including prior outpatient visits for hypertension and diabetes management   Lab Tests:  I Ordered, and personally interpreted labs.  The pertinent results include: CBC and CMP, shows blood sugar of 239   Imaging Studies ordered:    Cardiac Monitoring: / EKG:  Continuous pulse ox saturation 99% on room air interpreted as normal by me      Problem List / ED Course / Critical interventions / Medication management  Rash with allergic reaction.  Patient had diffuse erythema to the chest and upper back with no discernible urticaria.  No petechia or papules.  She had not had improvement with 25 mg of Benadryl at home that she had taken.  She is given Solu-Medrol and 50 mg of Benadryl here and had resolution of her rash and itching.  She is spontaneously awake but sleepy from the Benadryl.  She does have a ride home.  She does not know what she is allergic to, the only other time she had this was when she had morphine which she has not had any medications in this class.  We discussed close outpatient follow-up and possible allergy testing, advised to watch her sugars closely with the steroids.  Patient states she has been on steroids before and knows how to adjust her medications as needed based on her blood sugar levels.  She is advised  that she can take Zyrtec over-the-counter which is less sedating than the Benadryl if she would prefer if she has any itching.  She is given strict return precautions.  I also gave her prescription for EpiPen and discussed its use which would be for a more severe or potentially life-threatening reaction including facial swelling trouble breathing or swallowing and discussed its use and that she would need to come to the ER if this occurs and she has to use it. I ordered medication including Solu-Medrol and Benadryl for rash and itching Reevaluation of the patient after these medicines showed that the patient resolved I have reviewed the patients home medicines and have made adjustments as needed      Amount and/or Complexity of Data Reviewed Labs: ordered.  Risk Prescription drug management.           Final Clinical Impression(s) / ED Diagnoses Final diagnoses:  Allergic reaction, initial encounter    Rx / DC Orders ED Discharge Orders          Ordered    predniSONE (DELTASONE) 10 MG tablet  Daily        02/13/22 1953    EPINEPHrine 0.3 mg/0.3 mL IJ SOAJ injection  As needed        02/13/22 1953              Darci Current 02/13/22 Jake Michaelis, MD 02/23/22 1719

## 2022-02-13 NOTE — Discharge Instructions (Addendum)
Seen today for an allergic reaction to an unknown substance.  You were given steroids and Benadryl with resolution of your rash and itching.  We are going to send home with steroids.  As you know these can make your blood sugars go up.  Keep a close eye on your blood sugar and adjust her medications as needed.  Follow-up closely with your primary care doctor.  You can take Zyrtec over-the-counter daily as well and can take it twice daily if needed.  Your blood pressure was initially high in the ER, it is better now.  Make sure your regular doctor rechecks this.  Your blood sugar was also slightly elevated.  You are being given a prescription for an EpiPen that is to be used only for potentially life-threatening allergic reaction that includes swelling of the lips face or tongue or trouble breathing, not for more mild to moderate symptoms.  Use it as directed if needed but if you need to use it come to the emergency department after use

## 2022-02-14 ENCOUNTER — Encounter: Payer: Self-pay | Admitting: Hematology and Oncology

## 2022-02-14 ENCOUNTER — Other Ambulatory Visit: Payer: Self-pay

## 2022-02-14 ENCOUNTER — Other Ambulatory Visit (HOSPITAL_BASED_OUTPATIENT_CLINIC_OR_DEPARTMENT_OTHER): Payer: Self-pay

## 2022-02-15 ENCOUNTER — Other Ambulatory Visit (HOSPITAL_BASED_OUTPATIENT_CLINIC_OR_DEPARTMENT_OTHER): Payer: Self-pay

## 2022-02-17 ENCOUNTER — Other Ambulatory Visit (HOSPITAL_BASED_OUTPATIENT_CLINIC_OR_DEPARTMENT_OTHER): Payer: Self-pay

## 2022-04-17 ENCOUNTER — Ambulatory Visit
Admission: RE | Admit: 2022-04-17 | Discharge: 2022-04-17 | Discharge status: Absent without leave | Payer: Self-pay | Source: Ambulatory Visit

## 2022-04-17 ENCOUNTER — Ambulatory Visit
Admission: EM | Admit: 2022-04-17 | Discharge: 2022-04-17 | Disposition: A | Discharge status: Home / Self Care | Payer: Medicaid Other | Attending: Urgent Care | Admitting: Urgent Care

## 2022-04-17 DIAGNOSIS — E119 Type 2 diabetes mellitus without complications: Secondary | ICD-10-CM | POA: Diagnosis not present

## 2022-04-17 DIAGNOSIS — N760 Acute vaginitis: Secondary | ICD-10-CM | POA: Insufficient documentation

## 2022-04-17 DIAGNOSIS — R102 Pelvic and perineal pain: Secondary | ICD-10-CM | POA: Diagnosis not present

## 2022-04-17 DIAGNOSIS — B9689 Other specified bacterial agents as the cause of diseases classified elsewhere: Secondary | ICD-10-CM | POA: Diagnosis not present

## 2022-04-17 LAB — POCT URINALYSIS DIP (MANUAL ENTRY)
Bilirubin, UA: NEGATIVE
Blood, UA: NEGATIVE
Glucose, UA: 500 mg/dL — AB
Ketones, POC UA: NEGATIVE mg/dL
Leukocytes, UA: NEGATIVE
Nitrite, UA: NEGATIVE
Protein Ur, POC: 100 mg/dL — AB
Spec Grav, UA: >=1.03 — AB (ref 1.010–1.025)
Urobilinogen, UA: 0.2 E.U./dL
pH, UA: 5.5 (ref 5.0–8.0)

## 2022-04-17 MED ORDER — METRONIDAZOLE 500 MG PO TABS: 500.0000 mg | ORAL_TABLET | Freq: Two times a day (BID) | ORAL | 0 refills | Status: DC

## 2022-04-17 MED ORDER — FLUCONAZOLE 150 MG PO TABS: 150.0000 mg | ORAL_TABLET | ORAL | 0 refills | Status: DC

## 2022-04-17 NOTE — ED Provider Notes (Signed)
[facility]  Note:  This document was prepared using Systems analyst and may include unintentional dictation errors.  MRN: [id number] DOB: [birth year]  Subjective:   Elaine Perez is a 37 y.o. female presenting for 3-day history of acute onset recurrent vaginal discharge, irritation and lower abdominal cramping.  Patient thinks it might be related to using a new toilet paper that has lavender scented.  She has had a history of BV and yeast infections.  Her blood sugar is not controlled.  She is not using insulin.  Has urinary frequency but no dysuria, hematuria, urinary urgency.  She is not opposed to STI testing.  No current facility-administered medications for this encounter.  Current Outpatient Medications:    albuterol (VENTOLIN HFA) 108 (90 Base) MCG/ACT inhaler, Inhale 2 puffs into the lungs every 6 (six) hours as needed for wheezing or shortness of breath (Cough)., Disp: 18 g, Rfl: 0   amLODipine (NORVASC) 10 MG tablet, Take 1 tablet (10 mg total) by mouth daily., Disp: 90 tablet, Rfl: 0   carvedilol (COREG) 6.25 MG tablet, Take 6.25 mg by mouth 2 (two) times daily., Disp: , Rfl:    EPINEPHrine 0.3 mg/0.3 mL IJ SOAJ injection, Inject 0.3 mg into the muscle as needed for anaphylaxis., Disp: 2 each, Rfl: 0   fluticasone (FLONASE) 50 MCG/ACT nasal spray, Place 1 spray into both nostrils daily. Begin by using 2 sprays in each nare daily for 3 to 5 days, then decrease to 1 spray in each nare daily., Disp: 15.8 mL, Rfl: 2   furosemide (LASIX) 20 MG tablet, Take 1 tablet (20 mg total) by mouth daily., Disp: 30 tablet, Rfl: 0   gabapentin (NEURONTIN) 100 MG capsule, Take 1 capsule (100 mg total) by mouth 3 (three) times daily., Disp: 60 capsule, Rfl: 1   guaifenesin (HUMIBID E) 400 MG TABS tablet, Take 1 tablet 3 times daily as needed for chest congestion and cough, Disp: 21 tablet, Rfl: 0   hydrochlorothiazide (HYDRODIURIL) 25 MG tablet, Take 1 tablet  (25 mg total) by mouth daily., Disp: 30 tablet, Rfl: 1   losartan (COZAAR) 100 MG tablet, Take 100 mg by mouth daily., Disp: , Rfl:    metFORMIN (GLUCOPHAGE-XR) 500 MG 24 hr tablet, Take 500 mg by mouth daily., Disp: , Rfl:    Multiple Vitamin (MULTIVITAMIN WITH MINERALS) TABS tablet, Take 1 tablet by mouth daily., Disp: , Rfl:    naproxen (NAPROSYN) 500 MG tablet, Take 1 tablet (500 mg total) by mouth 2 (two) times daily with a meal., Disp: 30 tablet, Rfl: 0   OZEMPIC, 0.25 OR 0.5 MG/DOSE, 2 MG/1.5ML SOPN, SMARTSIG:0.25 Milligram(s) SUB-Q Once a Week, Disp: , Rfl:    promethazine-dextromethorphan (PROMETHAZINE-DM) 6.25-15 MG/5ML syrup, Take 5 mLs by mouth 4 (four) times daily as needed for cough., Disp: 118 mL, Rfl: 0   tiZANidine (ZANAFLEX) 4 MG tablet, Take 1 tablet (4 mg total) by mouth at bedtime., Disp: 30 tablet, Rfl: 0   Allergies  Allergen Reactions   Morphine And Related Itching and Rash    swelling swelling swelling swelling     Past Medical History:  Diagnosis Date   Asthma    Blood transfusion without reported diagnosis 2019   Diabetes mellitus without complication (HCC)    Fibroids    Hypertension    Iron deficiency anemia 04/27/2018   Sleep apnea    Does not use CPAP     Past Surgical History:  Procedure Laterality Date  ABDOMINAL HYSTERECTOMY     GALLBLADDER SURGERY     REPEAT CESAREAN SECTION      Family History  Problem Relation Age of Onset   Breast cancer Other    Heart disease Mother    Lupus Maternal Aunt    Stomach cancer Maternal Aunt    Colon cancer Neg Hx    Esophageal cancer Neg Hx    Rectal cancer Neg Hx     Social History   Tobacco Use   Smoking status: Never   Smokeless tobacco: Never  Vaping Use   Vaping Use: Never used  Substance Use Topics   Alcohol use: Never   Drug use: Never    ROS   Objective:   Vitals: BP (!) 162/105 (BP Location: Right Arm)   Pulse 81   Temp 98.1 F (36.7 C) (Oral)   Resp 20   LMP  05/05/2019 (Approximate)   SpO2 96%   Physical Exam Constitutional:      General: She is not in acute distress.    Appearance: Normal appearance. She is well-developed. She is not ill-appearing, toxic-appearing or diaphoretic.  HENT:     Head: Normocephalic and atraumatic.     Nose: Nose normal.     Mouth/Throat:     Mouth: Mucous membranes are moist.  Eyes:     General: No scleral icterus.       Right eye: No discharge.        Left eye: No discharge.     Extraocular Movements: Extraocular movements intact.     Conjunctiva/sclera: Conjunctivae normal.  Cardiovascular:     Rate and Rhythm: Normal rate.  Pulmonary:     Effort: Pulmonary effort is normal.  Abdominal:     General: Bowel sounds are normal. There is no distension.     Palpations: Abdomen is soft. There is no mass.     Tenderness: There is no abdominal tenderness. There is no right CVA tenderness, left CVA tenderness, guarding or rebound.  Skin:    General: Skin is warm and dry.  Neurological:     General: No focal deficit present.     Mental Status: She is alert and oriented to person, place, and time.  Psychiatric:        Mood and Affect: Mood normal.        Behavior: Behavior normal.        Thought Content: Thought content normal.        Judgment: Judgment normal.     Results for orders placed or performed during the hospital encounter of 04/17/22 (from the past 24 hour(s))  POCT urinalysis dipstick     Status: Abnormal   Collection Time: 04/17/22 10:09 AM  Result Value Ref Range   Color, UA yellow yellow   Clarity, UA clear clear   Glucose, UA =500 (A) negative mg/dL   Bilirubin, UA negative negative   Ketones, POC UA negative negative mg/dL   Spec Grav, UA >=1.030 (A) 1.010 - 1.025   Blood, UA negative negative   pH, UA 5.5 5.0 - 8.0   Protein Ur, POC =100 (A) negative mg/dL   Urobilinogen, UA 0.2 0.2 or 1.0 E.U./dL   Nitrite, UA Negative Negative   Leukocytes, UA Negative Negative    Assessment  and Plan :   PDMP not reviewed this encounter.  1. Suprapubic pain   2. Type 2 diabetes mellitus treated without insulin ([place])   3. Acute vaginitis   4. Bacterial vaginosis  We will treat patient empirically for bacterial vaginosis with Flagyl and for yeast vaginitis with fluconazole.  Labs pending. Counseled patient on potential for adverse effects with medications prescribed/recommended today, ER and return-to-clinic precautions discussed, patient verbalized understanding.    Jaynee Eagles, [place] 04/17/22 [id number]

## 2022-04-17 NOTE — ED Triage Notes (Signed)
Pt c/o vaginal irritation, abd cramping x 2-3 days-feels irritation r/t new toilet paper with lavender-NAD-steady gait

## 2022-04-17 NOTE — Discharge Instructions (Signed)
Please start metronidazole for bacterial vaginosis and fluconazole for yeast infection. Make sure you hydrate very well with plain water and a quantity of 80 ounces of water a day.  Please limit drinks that are considered urinary irritants such as soda, sweet tea, coffee, energy drinks, alcohol.  These can worsen your urinary and genital symptoms but also be the source of them.  I will let you know about your urine culture and vaginal swab results through MyChart to see if we need to prescribe or change your antibiotics based off of those results.

## 2022-04-18 LAB — CERVICOVAGINAL ANCILLARY ONLY
Bacterial Vaginitis (gardnerella): POSITIVE — AB
Candida Glabrata: NEGATIVE
Candida Vaginitis: NEGATIVE
Chlamydia: NEGATIVE
Comment: NEGATIVE
Comment: NEGATIVE
Comment: NEGATIVE
Comment: NEGATIVE
Comment: NEGATIVE
Comment: NORMAL
Neisseria Gonorrhea: NEGATIVE
Trichomonas: NEGATIVE

## 2022-04-18 LAB — URINE CULTURE: Culture: NO GROWTH

## 2022-05-03 DIAGNOSIS — J4530 Mild persistent asthma, uncomplicated: Secondary | ICD-10-CM | POA: Insufficient documentation

## 2022-05-03 DIAGNOSIS — E785 Hyperlipidemia, unspecified: Secondary | ICD-10-CM | POA: Insufficient documentation

## 2022-05-21 ENCOUNTER — Ambulatory Visit
Admission: RE | Admit: 2022-05-21 | Discharge: 2022-05-21 | Disposition: A | Discharge status: Home / Self Care | Payer: Medicaid Other | Source: Ambulatory Visit | Attending: Nurse Practitioner | Admitting: Nurse Practitioner

## 2022-05-21 VITALS — BP 115/81 | HR 87 | Temp 98.4°F | Resp 17

## 2022-05-21 DIAGNOSIS — Z79899 Other long term (current) drug therapy: Secondary | ICD-10-CM | POA: Insufficient documentation

## 2022-05-21 DIAGNOSIS — E119 Type 2 diabetes mellitus without complications: Secondary | ICD-10-CM | POA: Diagnosis not present

## 2022-05-21 DIAGNOSIS — Z7984 Long term (current) use of oral hypoglycemic drugs: Secondary | ICD-10-CM | POA: Insufficient documentation

## 2022-05-21 DIAGNOSIS — J029 Acute pharyngitis, unspecified: Secondary | ICD-10-CM | POA: Insufficient documentation

## 2022-05-21 DIAGNOSIS — Z1152 Encounter for screening for COVID-19: Secondary | ICD-10-CM | POA: Insufficient documentation

## 2022-05-21 DIAGNOSIS — R062 Wheezing: Secondary | ICD-10-CM | POA: Diagnosis not present

## 2022-05-21 DIAGNOSIS — J4521 Mild intermittent asthma with (acute) exacerbation: Secondary | ICD-10-CM | POA: Insufficient documentation

## 2022-05-21 DIAGNOSIS — B349 Viral infection, unspecified: Secondary | ICD-10-CM | POA: Diagnosis not present

## 2022-05-21 DIAGNOSIS — Z7952 Long term (current) use of systemic steroids: Secondary | ICD-10-CM | POA: Diagnosis not present

## 2022-05-21 DIAGNOSIS — I10 Essential (primary) hypertension: Secondary | ICD-10-CM | POA: Insufficient documentation

## 2022-05-21 LAB — POCT RAPID STREP A (OFFICE): Rapid Strep A Screen: NEGATIVE

## 2022-05-21 MED ORDER — BENZONATATE 200 MG PO CAPS: 200.0000 mg | ORAL_CAPSULE | Freq: Three times a day (TID) | ORAL | 0 refills | Status: DC | PRN

## 2022-05-21 MED ORDER — PREDNISONE 20 MG PO TABS: 40.0000 mg | ORAL_TABLET | Freq: Every day | ORAL | 0 refills | Status: AC

## 2022-05-21 NOTE — ED Triage Notes (Signed)
Pt presents with c/o cough and reports pain when swallowing X 4 days.

## 2022-05-21 NOTE — ED Provider Notes (Signed)
[facility]    CSN: [id number] Arrival date & time: 05/21/22  1357      History   Chief Complaint Chief Complaint  Patient presents with   Cough    Covid - Entered by patient    HPI Elaine Perez is a 37 y.o. female  presents for evaluation of URI symptoms for 4 days. Patient reports associated symptoms of cough, congestion, sore throat, wheezing. Denies N/V/D, fevers, body aches, ear pain, shortness of breath. Patient does have a hx of asthma.  Has been using her albuterol inhaler as well as her home nebulizer with temporary improvement of symptoms.  No known sick contacts.  Pt has taken cough medicine OTC for symptoms. Pt has no other concerns at this time.    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/2019   Iron deficiency anemia due to chronic blood loss 08/31/2018   Iron deficiency anemia 04/27/2018   Leiomyoma of uterus, unspecified 06/15/2016   Anemia, deficiency 05/13/2015   DDD (degenerative disc disease), lumbar 05/16/2013   BMI 40.0-44.9, adult 12/17/2012   OSA on CPAP 12/17/2012   Depression 05/11/2012   Diabetes mellitus type 2 in obese 12/23/2011   Hypertension 12/23/2011   Female pelvic pain 12/23/2011    Past Surgical History:  Procedure Laterality Date   ABDOMINAL HYSTERECTOMY     GALLBLADDER SURGERY     REPEAT CESAREAN SECTION      OB History     Gravida  3   Para      Term      Preterm      AB  0   Living  3      SAB  0   IAB  0   Ectopic  0   Multiple  0   Live Births  3             Home Medications    Prior to Admission medications   Medication Sig Start Date End Date Taking? Authorizing Provider  benzonatate (TESSALON) 200 MG capsule Take 1 capsule (200 mg total) by mouth 3 (three) times daily as needed for cough. 05/21/22  Yes Radford Pax, NP  predniSONE (DELTASONE) 20 MG tablet Take 2 tablets (40 mg total) by mouth daily with breakfast for 5 days. 05/21/22 05/26/22 Yes Radford Pax, NP  albuterol (VENTOLIN HFA) 108 (90 Base) MCG/ACT inhaler Inhale 2 puffs into the lungs every 6 (six) hours as needed for wheezing or shortness of breath (Cough). 11/05/21   Theadora Rama Scales, PA-C  amLODipine (NORVASC) 10 MG tablet Take 1 tablet (10 mg total) by mouth daily. 09/09/21   Wallis Bamberg, PA-C  carvedilol (COREG) 6.25 MG tablet Take 6.25 mg by mouth 2 (two) times daily. 05/16/20   [provider]  EPINEPHrine 0.3 mg/0.3 mL IJ SOAJ injection Inject 0.3 mg into the muscle as needed for anaphylaxis. 02/13/22   Carmel Sacramento A, PA-C  fluconazole (DIFLUCAN) 150 MG tablet Take 1 tablet (150 mg total) by mouth once a  week. 04/17/22   Wallis Bamberg, PA-C  fluticasone [facility]) 50 MCG/ACT nasal spray Place 1 spray into both nostrils daily. Begin by using 2 sprays in each nare daily for 3 to 5 days, then decrease to 1 spray in each nare daily. 11/05/21   Theadora Rama Scales, PA-C  furosemide (LASIX) 20 MG tablet Take 1 tablet (20 mg total) by mouth daily. 05/28/21   Geoffery Lyons, MD  gabapentin (NEURONTIN) 100 MG capsule Take 1 capsule (100 mg total) by mouth 3 (three) times daily. 10/15/21   Myra Rude, MD  guaifenesin (HUMIBID E) 400 MG TABS tablet Take 1 tablet 3 times daily as needed for chest congestion and cough 11/05/21   Theadora Rama Scales, PA-C  hydrochlorothiazide (HYDRODIURIL) 25 MG tablet Take 1 tablet (25 mg total) by mouth daily. 07/21/20   Mancel Bale, MD  losartan (COZAAR) 100 MG tablet Take 100 mg by mouth daily.    [provider]  metFORMIN  (GLUCOPHAGE-XR) 500 MG 24 hr tablet Take 500 mg by mouth daily. 07/13/21   [provider]  metroNIDAZOLE (FLAGYL) 500 MG tablet Take 1 tablet (500 mg total) by mouth 2 (two) times daily with a meal. DO NOT CONSUME ALCOHOL WHILE TAKING THIS MEDICATION. 04/17/22   Wallis Bamberg, PA-C  Multiple Vitamin (MULTIVITAMIN WITH MINERALS) TABS tablet Take 1 tablet by mouth daily.    [provider]  naproxen (NAPROSYN) 500 MG tablet Take 1 tablet (500 mg total) by mouth 2 (two) times daily with a meal. 01/04/22   Wallis Bamberg, PA-C  OZEMPIC, 0.25 OR 0.5 MG/DOSE, 2 MG/1.5ML SOPN SMARTSIG:0.25 Milligram(s) SUB-Q Once a Week 04/25/21   [provider]  promethazine-dextromethorphan (PROMETHAZINE-DM) 6.25-15 MG/5ML syrup Take 5 mLs by mouth 4 (four) times daily as needed for cough. 11/05/21   Theadora Rama Scales, PA-C  tiZANidine (ZANAFLEX) 4 MG tablet Take 1 tablet (4 mg total) by mouth at bedtime. 01/04/22   Wallis Bamberg, PA-C    Family History Family History  Problem Relation Age of Onset   Breast cancer Other    Heart disease Mother    Lupus Maternal Aunt    Stomach cancer Maternal Aunt    Colon cancer Neg Hx    Esophageal cancer Neg Hx    Rectal cancer Neg Hx     Social History Social History   Tobacco Use   Smoking status: Never   Smokeless tobacco: Never  Vaping Use   Vaping Use: Never used  Substance Use Topics   Alcohol use: Never   Drug use: Never     Allergies   Morphine and related   Review of Systems Review of Systems  HENT:  Positive for congestion and sore throat.   Respiratory:  Positive for cough and wheezing.      Physical Exam Triage Vital Signs ED Triage Vitals [05/21/22 1405]  Enc Vitals Group     BP 115/81     Pulse Rate 87     Resp 17     Temp 98.4 F (36.9 C)     Temp Source Oral     SpO2 96 %     Weight      Height      Head Circumference      Peak Flow      Pain Score 5     Pain Loc      Pain Edu?      Excl. in GC?     No data found.  Updated Vital  Signs BP 115/81   Pulse 87   Temp 98.4 F (36.9 C) (Oral)   Resp 17   LMP 05/05/2019 (Approximate)   SpO2 96%   Visual Acuity Right Eye Distance:   Left Eye Distance:   Bilateral Distance:    Right Eye Near:   Left Eye Near:    Bilateral Near:     Physical Exam Vitals and nursing note reviewed.  Constitutional:      General: She is not in acute distress.    Appearance: She is well-developed. She is not ill-appearing.  HENT:     Head: Normocephalic and atraumatic.     Right Ear: Tympanic membrane and ear canal normal.     Left Ear: Tympanic membrane and ear canal normal.     Nose: Congestion present.     Mouth/Throat:     Mouth: Mucous membranes are moist.     Pharynx: Oropharynx is clear. Uvula midline. Posterior oropharyngeal erythema present.     Tonsils: No tonsillar exudate or tonsillar abscesses.  Eyes:     Conjunctiva/sclera: Conjunctivae normal.     Pupils: Pupils are equal, round, and reactive to light.  Cardiovascular:     Rate and Rhythm: Normal rate and regular rhythm.     Heart sounds: Normal heart sounds.  Pulmonary:     Effort: Pulmonary effort is normal. No respiratory distress.     Breath sounds: Normal breath sounds. No stridor. No wheezing, rhonchi or rales.  Musculoskeletal:     Cervical back: Normal range of motion and neck supple.  Lymphadenopathy:     Cervical: No cervical adenopathy.  Skin:    General: Skin is warm and dry.  Neurological:     General: No focal deficit present.     Mental Status: She is alert and oriented to person, place, and time.  Psychiatric:        Mood and Affect: Mood normal.        Behavior: Behavior normal.      UC Treatments / Results  Labs (all labs ordered are listed, but only abnormal results are displayed) Labs Reviewed  SARS CORONAVIRUS 2 (TAT 6-24 HRS)  POCT RAPID STREP A (OFFICE)    EKG   Radiology No results found.  Procedures Procedures (including  critical care time)  Medications Ordered in UC Medications - No data to display  Initial Impression / Assessment and Plan / UC Course  I have reviewed the triage vital signs and the nursing notes.  Pertinent labs & imaging results that were available during my care of the patient were reviewed by me and considered in my medical decision making (see chart for details).     Negative rapid strep.  Discussed viral illness with asthma exacerbation COVID PCR and will contact if positive Prednisone daily for 5 days.  Patient to continue albuterol for home nebulizer as needed Tessalon as needed for cough Rest and fluids PCP follow-up if symptoms do not improve ER precautions reviewed and patient verbalized understanding Final Clinical Impressions(s) / UC Diagnoses   Final diagnoses:  Sore throat  Mild intermittent asthma with acute exacerbation  Viral illness     Discharge Instructions      Prednisone daily for 5 days Tessalon as needed for cough Continue albuterol inhaler or home nebulizer as needed The clinic will contact you with results of the COVID testing if it is positive Rest and fluids Follow-up with your PCP if your symptoms do not improve Please go to the emergency room  if you have any worsening symptoms      ED Prescriptions     Medication Sig Dispense Auth. Provider   predniSONE (DELTASONE) 20 MG tablet Take 2 tablets (40 mg total) by mouth daily with breakfast for 5 days. 10 tablet Radford PaxMayer, Jodi R, NP   benzonatate (TESSALON) 200 MG capsule Take 1 capsule (200 mg total) by mouth 3 (three) times daily as needed for cough. 20 capsule Radford PaxMayer, Jodi R, NP      PDMP not reviewed this encounter.   Radford PaxMayer, Jodi R, NP 05/21/22 1452

## 2022-05-21 NOTE — Discharge Instructions (Signed)
Prednisone daily for 5 days Tessalon as needed for cough Continue albuterol inhaler or home nebulizer as needed The clinic will contact you with results of the COVID testing if it is positive Rest and fluids Follow-up with your PCP if your symptoms do not improve Please go to the emergency room if you have any worsening symptoms

## 2022-05-22 LAB — SARS CORONAVIRUS 2 (TAT 6-24 HRS): SARS Coronavirus 2: NEGATIVE

## 2022-05-30 ENCOUNTER — Encounter: Payer: Self-pay | Admitting: *Deleted

## 2022-06-14 ENCOUNTER — Ambulatory Visit
Admission: RE | Admit: 2022-06-14 | Discharge: 2022-06-14 | Discharge status: Against Medical Advice | Payer: Medicaid Other | Source: Ambulatory Visit | Attending: Internal Medicine | Admitting: Internal Medicine

## 2022-06-14 NOTE — ED Triage Notes (Signed)
Pounding headache since yesterday. Pt states headache occurs when BP is up. Taking BP meds regularly. States she has slightly blurry vision. Top of right foot x2 weeks. Denies hitting foot or twisting.

## 2022-06-20 ENCOUNTER — Encounter (HOSPITAL_BASED_OUTPATIENT_CLINIC_OR_DEPARTMENT_OTHER): Payer: Self-pay | Admitting: Emergency Medicine

## 2022-06-20 ENCOUNTER — Emergency Department (HOSPITAL_BASED_OUTPATIENT_CLINIC_OR_DEPARTMENT_OTHER)
Admission: EM | Admit: 2022-06-20 | Discharge: 2022-06-20 | Disposition: A | Discharge status: Home / Self Care | Payer: Medicaid Other | Attending: Emergency Medicine | Admitting: Emergency Medicine

## 2022-06-20 ENCOUNTER — Other Ambulatory Visit: Payer: Self-pay

## 2022-06-20 ENCOUNTER — Emergency Department (HOSPITAL_BASED_OUTPATIENT_CLINIC_OR_DEPARTMENT_OTHER): Payer: Medicaid Other

## 2022-06-20 DIAGNOSIS — M25572 Pain in left ankle and joints of left foot: Secondary | ICD-10-CM

## 2022-06-20 DIAGNOSIS — Z79899 Other long term (current) drug therapy: Secondary | ICD-10-CM | POA: Insufficient documentation

## 2022-06-20 DIAGNOSIS — I10 Essential (primary) hypertension: Secondary | ICD-10-CM | POA: Diagnosis not present

## 2022-06-20 MED ORDER — AMLODIPINE BESYLATE 5 MG PO TABS: 5.0000 mg | ORAL_TABLET | Freq: Once | ORAL | Status: DC

## 2022-06-20 MED ORDER — ACETAMINOPHEN 325 MG PO TABS
650.0000 mg | ORAL_TABLET | Freq: Once | ORAL | Status: AC
  Administered 2022-06-20: 650 mg via ORAL
  Filled 2022-06-20: qty 2

## 2022-06-20 MED ORDER — HYDRALAZINE HCL 25 MG PO TABS
25.0000 mg | ORAL_TABLET | Freq: Once | ORAL | Status: AC
  Administered 2022-06-20: 25 mg via ORAL
  Filled 2022-06-20: qty 1

## 2022-06-20 NOTE — ED Provider Notes (Signed)
[place] [facility] DEPARTMENT AT [facility] Provider Note   CSN: [id number] Arrival date & time: 06/20/22  1813     History  Chief Complaint  Patient presents with   Headache    Laurana Battey is a 37 y.o. female.   Headache    Patient presents to the ED for evaluation of ankle pain and a headache.  Patient states her main complaint is sharp pain in the outside part of her ankle.  She has noticed an area that is tender to the touch.  No recent trauma.  She is not having any calf swelling.  Patient also has had a pressure-like headache.  She is not having any fevers or chills.  No numbness or weakness.  Patient does not have history of chronic headaches.  She has noticed that her blood pressure has been elevated recently.  Home Medications Prior to Admission medications   Medication Sig Start Date End Date Taking? Authorizing Provider  albuterol (VENTOLIN HFA) 108 (90 Base) MCG/ACT inhaler Inhale 2 puffs into the lungs every 6 (six) hours as needed for wheezing or shortness of breath (Cough). 11/05/21   Theadora Rama Scales, PA-C  amLODipine (NORVASC) 10 MG tablet Take 1 tablet (10 mg total) by mouth daily. 09/09/21   Wallis Bamberg, PA-C  benzonatate (TESSALON) 200 MG capsule Take 1 capsule (200 mg total) by mouth 3 (three) times daily as needed for cough. 05/21/22   Radford Pax, NP  carvedilol (COREG) 6.25 MG tablet Take 6.25 mg by mouth 2 (two) times daily. 05/16/20   [provider]  EPINEPHrine 0.3 mg/0.3 mL IJ SOAJ injection Inject 0.3 mg into the muscle as needed for anaphylaxis. 02/13/22   Carmel Sacramento A, PA-C  fluconazole (DIFLUCAN) 150 MG tablet Take 1 tablet (150 mg total) by mouth once a week. 04/17/22   Wallis Bamberg, PA-C  fluticasone (FLONASE) 50 MCG/ACT nasal spray Place 1 spray into both nostrils daily. Begin by using 2 sprays in each nare daily for 3 to 5 days, then decrease to 1 spray in each nare daily. 11/05/21   Theadora Rama Scales, PA-C   furosemide (LASIX) 20 MG tablet Take 1 tablet (20 mg total) by mouth daily. 05/28/21   Geoffery Lyons, MD  gabapentin (NEURONTIN) 100 MG capsule Take 1 capsule (100 mg total) by mouth 3 (three) times daily. 10/15/21   Myra Rude, MD  guaifenesin (HUMIBID E) 400 MG TABS tablet Take 1 tablet 3 times daily as needed for chest congestion and cough 11/05/21   Theadora Rama Scales, PA-C  hydrochlorothiazide (HYDRODIURIL) 25 MG tablet Take 1 tablet (25 mg total) by mouth daily. 07/21/20   Mancel Bale, MD  losartan (COZAAR) 100 MG tablet Take 100 mg by mouth daily.    [provider]  metFORMIN (GLUCOPHAGE-XR) 500 MG 24 hr tablet Take 500 mg by mouth daily. 07/13/21   [provider]  metroNIDAZOLE (FLAGYL) 500 MG tablet Take 1 tablet (500 mg total) by mouth 2 (two) times daily with a meal. DO NOT CONSUME ALCOHOL WHILE TAKING THIS MEDICATION. 04/17/22   Wallis Bamberg, PA-C  Multiple Vitamin (MULTIVITAMIN WITH MINERALS) TABS tablet Take 1 tablet by mouth daily.    [provider]  naproxen (NAPROSYN) 500 MG tablet Take 1 tablet (500 mg total) by mouth 2 (two) times daily with a meal. 01/04/22   Wallis Bamberg, PA-C  OZEMPIC, 0.25 OR 0.5 MG/DOSE, 2 MG/1.5ML SOPN SMARTSIG:0.25 Milligram(s) SUB-Q Once a Week 04/25/21   [provider]  promethazine-dextromethorphan (PROMETHAZINE-DM) 6.25-15 MG/5ML syrup Take 5 mLs by mouth 4 (four) times daily as needed for cough. 11/05/21   Theadora Rama Scales, PA-C  tiZANidine (ZANAFLEX) 4 MG tablet Take 1 tablet (4 mg total) by mouth at bedtime. 01/04/22   Wallis Bamberg, PA-C      Allergies    Morphine and related    Review of Systems   Review of Systems  Neurological:  Positive for headaches.    Physical Exam Updated Vital Signs BP (!) 161/100 (BP Location: Left Arm)   Pulse 100   Temp 98 F (36.7 C)   Resp 18   Ht 1.549 m (5\' 1" )   Wt 109.3 kg   LMP 05/05/2019 (Approximate)   SpO2 98%   BMI 45.54 kg/m  Physical  Exam Vitals and nursing note reviewed.  Constitutional:      General: She is not in acute distress.    Appearance: She is well-developed.  HENT:     Head: Normocephalic and atraumatic.     Right Ear: External ear normal.     Left Ear: External ear normal.  Eyes:     General: No scleral icterus.       Right eye: No discharge.        Left eye: No discharge.     Conjunctiva/sclera: Conjunctivae normal.  Neck:     Trachea: No tracheal deviation.  Cardiovascular:     Rate and Rhythm: Normal rate and regular rhythm.  Pulmonary:     Effort: Pulmonary effort is normal. No respiratory distress.     Breath sounds: Normal breath sounds. No stridor. No wheezing or rales.  Abdominal:     General: Bowel sounds are normal. There is no distension.     Palpations: Abdomen is soft.     Tenderness: There is no abdominal tenderness. There is no guarding or rebound.  Musculoskeletal:        General: Tenderness present. No deformity.     Cervical back: Neck supple.     Comments: Tenderness palpation lateral malleolus, soft mobile area of tenderness possible ganglion cyst  Skin:    General: Skin is warm and dry.     Findings: No rash.  Neurological:     General: No focal deficit present.     Mental Status: She is alert.     Cranial Nerves: No cranial nerve deficit, dysarthria or facial asymmetry.     Sensory: No sensory deficit.     Motor: No abnormal muscle tone or seizure activity.     Coordination: Coordination normal.  Psychiatric:        Mood and Affect: Mood normal.     ED Results / Procedures / Treatments   Labs (all labs ordered are listed, but only abnormal results are displayed) Labs Reviewed - No data to display  EKG None  Radiology DG Ankle Complete Right  Result Date: 06/20/2022 CLINICAL DATA:  Burning, pain and swelling EXAM: RIGHT ANKLE - COMPLETE 3 VIEW COMPARISON:  None Available. FINDINGS: Well corticated plantar and Achilles calcaneal spurs. No fracture or  dislocation. Slight degenerative changes of the dorsal aspect of the midfoot. IMPRESSION: Soft tissue swelling.  Calcaneal spurs. Electronically Signed   By: Karen Kays M.D.   On: 06/20/2022 19:21    Procedures Procedures    Medications Ordered in ED Medications  acetaminophen (TYLENOL) tablet 650 mg (650 mg Oral Given 06/20/22 1852)  hydrALAZINE (APRESOLINE) tablet 25 mg (25 mg Oral Given 06/20/22 1852)    ED Course/  Medical Decision Making/ A&P                             Medical Decision Making Problems Addressed: Acute left ankle pain: acute illness or injury Hypertension, unspecified type: acute illness or injury that poses a threat to life or bodily functions  Amount and/or Complexity of Data Reviewed Radiology: ordered and independent interpretation performed.  Risk OTC drugs. Prescription drug management.   Patient presents ED with complaints of ankle pain.  Patient does have evidence of some soft tissue swelling.  Possible she may have a ganglion cyst in that area.  No evidence of fracture.  Findings are not suggestive of infection.  She was also complained about headache.  Patient does not have any neurologic deficits.  No thunderclap onset.  Doubt hemorrhage or infection.  She has no neurodeficits.  Is possible her blood pressure was contributing to her mild headache.  Patient was given Tylenol her blood pressure improved in the ED with an addition of an oral blood pressure medication.  Will have her continue to monitor blood pressure closely and follow-up with her PCP.  Will have her follow-up with an orthopedic doctor regarding her ankle pain       Final Clinical Impression(s) / ED Diagnoses Final diagnoses:  Hypertension, unspecified type  Acute left ankle pain    Rx / DC Orders ED Discharge Orders     None         Linwood Dibbles, MD 06/20/22 1948

## 2022-06-20 NOTE — ED Triage Notes (Signed)
Pt arrives to ED with c/o headache x4-5 days.

## 2022-06-20 NOTE — ED Notes (Signed)
Pt verbalized understanding of d/c instructions, meds, and followup care. Ankle brace applied. Denies questions. VSS, no distress noted. Steady gait to exit with all belongings.

## 2022-06-20 NOTE — Discharge Instructions (Signed)
.    Follow-up with your primary care doctor to have your blood pressure rechecked.  Follow-up with your orthopedic doctor for further evaluation of your ankle pain

## 2022-07-30 ENCOUNTER — Ambulatory Visit: Payer: Self-pay

## 2022-08-03 ENCOUNTER — Other Ambulatory Visit: Payer: Self-pay

## 2022-08-03 ENCOUNTER — Emergency Department (HOSPITAL_BASED_OUTPATIENT_CLINIC_OR_DEPARTMENT_OTHER)
Admission: EM | Admit: 2022-08-03 | Discharge: 2022-08-03 | Disposition: A | Discharge status: Home / Self Care | Payer: Medicaid Other | Attending: Emergency Medicine | Admitting: Emergency Medicine

## 2022-08-03 ENCOUNTER — Ambulatory Visit
Admission: RE | Admit: 2022-08-03 | Discharge: 2022-08-03 | Disposition: A | Discharge status: Home / Self Care | Payer: Medicaid Other | Source: Ambulatory Visit | Attending: Internal Medicine | Admitting: Internal Medicine

## 2022-08-03 ENCOUNTER — Emergency Department (HOSPITAL_BASED_OUTPATIENT_CLINIC_OR_DEPARTMENT_OTHER): Payer: Medicaid Other

## 2022-08-03 VITALS — BP 150/93 | HR 73 | Resp 16

## 2022-08-03 DIAGNOSIS — E119 Type 2 diabetes mellitus without complications: Secondary | ICD-10-CM | POA: Insufficient documentation

## 2022-08-03 DIAGNOSIS — R200 Anesthesia of skin: Secondary | ICD-10-CM | POA: Diagnosis not present

## 2022-08-03 DIAGNOSIS — J45909 Unspecified asthma, uncomplicated: Secondary | ICD-10-CM | POA: Insufficient documentation

## 2022-08-03 DIAGNOSIS — Z7951 Long term (current) use of inhaled steroids: Secondary | ICD-10-CM | POA: Insufficient documentation

## 2022-08-03 DIAGNOSIS — M79662 Pain in left lower leg: Secondary | ICD-10-CM

## 2022-08-03 DIAGNOSIS — M79672 Pain in left foot: Secondary | ICD-10-CM | POA: Diagnosis not present

## 2022-08-03 DIAGNOSIS — Z7984 Long term (current) use of oral hypoglycemic drugs: Secondary | ICD-10-CM | POA: Diagnosis not present

## 2022-08-03 DIAGNOSIS — Z79899 Other long term (current) drug therapy: Secondary | ICD-10-CM | POA: Diagnosis not present

## 2022-08-03 MED ORDER — ALBUTEROL SULFATE (2.5 MG/3ML) 0.083% IN NEBU
2.5000 mg | INHALATION_SOLUTION | Freq: Once | RESPIRATORY_TRACT | Status: AC
  Administered 2022-08-03: 2.5 mg via RESPIRATORY_TRACT
  Filled 2022-08-03: qty 3

## 2022-08-03 NOTE — ED Notes (Signed)
[place] called to notify staff the patient complained of SOB and Chest pain during the procedure. The pt did not notify anyone of her symptoms.

## 2022-08-03 NOTE — ED Notes (Signed)
DP Butler notifed of CP.

## 2022-08-03 NOTE — ED Provider Notes (Signed)
[facility]    CSN: [id number] Arrival date & time: 08/03/22  1559      History   Chief Complaint Chief Complaint  Patient presents with   Foot Pain    Entered by patient   Leg Pain    HPI Elaine Perez is a 37 y.o. female presents for evaluation of calf pain.  Patient reports 3 days of a persistent left calf pain that is worse with walking or standing.  Denies any known injury or inciting event.  Denies any recent long distance travel.  No history of DVT or PE.  No family history of DVTs.  Denies any history of clotting disorder.  Does endorse some warmth and swelling to her calf.  She is taken her hydrocodone which has not improved her symptoms.  She does states she had 1 episode of some chest tightness this morning.  She does have a history of asthma but she states the symptoms were not similar to her previous asthma symptoms.  This resolved and has not returned.  No current chest pain or shortness of breath.  No other concerns at this time.   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/2019   Iron deficiency anemia due to chronic blood loss 08/31/2018   Iron deficiency anemia 04/27/2018   Leiomyoma of uterus, unspecified 06/15/2016   Anemia, deficiency 05/13/2015   DDD (degenerative disc disease), lumbar 05/16/2013   BMI 40.0-44.9, adult (HCC) 12/17/2012   OSA on CPAP 12/17/2012   Depression 05/11/2012   Diabetes mellitus type 2 in obese 12/23/2011   Hypertension 12/23/2011   Female pelvic pain 12/23/2011    Past Surgical History:  Procedure Laterality Date    ABDOMINAL HYSTERECTOMY     GALLBLADDER SURGERY     REPEAT CESAREAN SECTION      OB History     Gravida  3   Para      Term      Preterm      AB  0   Living  3      SAB  0   IAB  0   Ectopic  0   Multiple  0   Live Births  3            Home Medications    Prior to Admission medications   Medication Sig Start Date End Date Taking? Authorizing Provider  albuterol (VENTOLIN HFA) 108 (90 Base) MCG/ACT inhaler Inhale 2 puffs into the lungs every 6 (six) hours as needed for wheezing or shortness of breath (Cough). 11/05/21   Theadora Rama Scales, PA-C  amLODipine (NORVASC) 10 MG tablet Take 1 tablet (10 mg total) by mouth daily. 09/09/21   Wallis Bamberg, PA-C  benzonatate (TESSALON) 200 MG capsule Take 1 capsule (200 mg total) by mouth 3 (three) times daily as needed for cough. 05/21/22   Radford Pax, NP  carvedilol (COREG) 6.25 MG tablet Take 6.25 mg by mouth 2 (two) times daily. 05/16/20   [provider]  EPINEPHrine 0.3 mg/0.3 mL IJ  SOAJ injection Inject 0.3 mg into the muscle as needed for anaphylaxis. 02/13/22   Carmel Sacramento A, PA-C  fluconazole (DIFLUCAN) 150 MG tablet Take 1 tablet (150 mg total) by mouth once a week. 04/17/22   Wallis Bamberg, PA-C  fluticasone (FLONASE) 50 MCG/ACT nasal spray Place 1 spray into both nostrils daily. Begin by using 2 sprays in each nare daily for 3 to 5 days, then decrease to 1 spray in each nare daily. 11/05/21   Theadora Rama Scales, PA-C  furosemide (LASIX) 20 MG tablet Take 1 tablet (20 mg total) by mouth daily. 05/28/21   Geoffery Lyons, MD  gabapentin (NEURONTIN) 100 MG capsule Take 1 capsule (100 mg total) by mouth 3 (three) times daily. 10/15/21   Myra Rude, MD  guaifenesin (HUMIBID E) 400 MG TABS tablet Take 1 tablet 3 times daily as needed for chest congestion and cough 11/05/21   Theadora Rama Scales, PA-C  hydrochlorothiazide (HYDRODIURIL) 25 MG tablet Take 1 tablet (25 mg total) by mouth daily. 07/21/20   Mancel Bale, MD  losartan (COZAAR) 100 MG tablet Take 100 mg by mouth daily.    [provider]  metFORMIN (GLUCOPHAGE-XR) 500 MG 24 hr tablet Take 500 mg by mouth daily. 07/13/21   [provider]  metroNIDAZOLE (FLAGYL) 500 MG tablet Take 1 tablet (500 mg total) by mouth 2 (two) times daily with a meal. DO NOT CONSUME ALCOHOL WHILE TAKING THIS MEDICATION. 04/17/22   Wallis Bamberg, PA-C  Multiple Vitamin (MULTIVITAMIN WITH MINERALS) TABS tablet Take 1 tablet by mouth daily.    [provider]  naproxen (NAPROSYN) 500 MG tablet Take 1 tablet (500 mg total) by mouth 2 (two) times daily with a meal. 01/04/22   Wallis Bamberg, PA-C  OZEMPIC, 0.25 OR 0.5 MG/DOSE, 2 MG/1.5ML SOPN SMARTSIG:0.25 Milligram(s) SUB-Q Once a Week 04/25/21   [provider]  promethazine-dextromethorphan (PROMETHAZINE-DM) 6.25-15 MG/5ML syrup Take 5 mLs by mouth 4 (four) times daily as needed for cough. 11/05/21   Theadora Rama Scales, PA-C  tiZANidine (ZANAFLEX) 4 MG tablet Take 1 tablet (4 mg total) by mouth at bedtime. 01/04/22   Wallis Bamberg, PA-C    Family History Family History  Problem Relation Age of Onset   Breast cancer Other    Heart disease Mother    Lupus Maternal Aunt    Stomach cancer Maternal Aunt    Colon cancer Neg Hx    Esophageal cancer Neg Hx    Rectal cancer Neg Hx     Social History Social History   Tobacco Use   Smoking status: Never   Smokeless tobacco: Never  Vaping Use   Vaping Use: Never used  Substance Use Topics   Alcohol use: Never   Drug use: Never     Allergies   Morphine and codeine   Review of Systems Review of Systems  Skin:        Left calf pain     Physical Exam Triage Vital Signs ED Triage Vitals  Enc Vitals Group     BP 08/03/22 1610 (!) 150/93     Pulse Rate 08/03/22 1609 73     Resp 08/03/22 1609 16     Temp --      Temp src --      SpO2 08/03/22 1609 97 %     Weight --      Height --      Head Circumference --      Peak  Flow --  Pain Score 08/03/22 1608 7     Pain Loc --      Pain Edu? --      Excl. in GC? --    No data found.  Updated Vital Signs BP (!) 150/93 (BP Location: Left Arm)   Pulse 73   Resp 16   LMP 05/05/2019 (Approximate)   SpO2 97%   Visual Acuity Right Eye Distance:   Left Eye Distance:   Bilateral Distance:    Right Eye Near:   Left Eye Near:    Bilateral Near:     Physical Exam Vitals and nursing note reviewed.  Constitutional:      General: She is not in acute distress.    Appearance: Normal appearance. She is obese. She is not ill-appearing.  HENT:     Head: Normocephalic and atraumatic.  Eyes:     Pupils: Pupils are equal, round, and reactive to light.  Cardiovascular:     Rate and Rhythm: Normal rate.  Pulmonary:     Effort: Pulmonary effort is normal.  Skin:    General: Skin is warm and dry.     Comments: Sniffing and tenderness along the posterior left calf with mild warmth, no erythema.  Positive Homans' sign.  Left calf measures 42 cm right measures 41 cm  Neurological:     General: No focal deficit present.     Mental Status: She is alert and oriented to person, place, and time.  Psychiatric:        Mood and Affect: Mood normal.        Behavior: Behavior normal.    Clinical feature Score     Active cancer (treatment ongoing or within the previous six months or palliative) 0  Paralysis, paresis, or recent plaster immobilization of the lower extremities 0  Recently bedridden for more than three days or major surgery, within four weeks                                                    0  Localized tenderness along the distribution of the deep venous system 1  Entire leg swollen 0  Calf swelling by more than 3 cm when compared to the asymptomatic leg (measured below tibial tuberosity) 0  Pitting edema (greater in the symptomatic leg) 0  Collateral superficial veins (nonvaricose) 0  Alternative diagnosis as likely or more likely than that of deep  venous thrombosis -2                                                                                                                                                             Total Score  1     Interpretation    High probability  3 or greater  Moderate probability 1 or 2  Low probability 0 or less  Modification   This clinical model has been modified to take one other clinical feature into account: a previously documented deep vein thrombosis (DVT) is given the score of 1. Using this modified scoring system, DVT is either likely or unlikely, as follows:  DVT likely 2 or greater   DVT unlikely 1 or less     UC Treatments / Results  Labs (all labs ordered are listed, but only abnormal results are displayed) Labs Reviewed - No data to display  EKG   Radiology No results found.  Procedures Procedures (including critical care time)  Medications Ordered in UC Medications - No data to display  Initial Impression / Assessment and Plan / UC Course  I have reviewed the triage vital signs and the nursing notes.  Pertinent labs & imaging results that were available during my care of the patient were reviewed by me and considered in my medical decision making (see chart for details).     Reviewed exam and symptoms with patient.  Given acute symptoms and exam concern for possible DVT.  Advise she go to the emergency room to rule this out.  She is in agreement with plan will go POV to the emergency room.  She was instructed to pull over and call 911 for any worsening symptoms that occur in transit and she verbalized understanding Final Clinical Impressions(s) / UC Diagnoses   Final diagnoses:  Pain of left calf     Discharge Instructions      Please go to the emergency room for further evaluation of your symptoms    ED Prescriptions   None    PDMP not reviewed this encounter.   Radford Pax, NP 08/03/22 1700

## 2022-08-03 NOTE — Discharge Instructions (Signed)
Please go to the emergency room for further evaluation of your symptoms 

## 2022-08-03 NOTE — ED Provider Notes (Signed)
[place] [facility] Provider Note   CSN: [id number] Arrival date & time: 08/03/22  1709     History {Add pertinent medical, surgical, social history, OB history to HPI:1} Chief Complaint  Patient presents with   Foot Pain    Elaine Perez is a 37 y.o. female.  She has a history of diabetes.  She is complaining of pain that started in her left foot couple of days ago.  It is now extending up into her calf.  Worse with ambulation or weightbearing.  No known trauma.  Does have a little bit of numbness on the left foot.  No chest pain shortness of breath, no prior history of DVT.  She went to urgent care today and they recommended she come here for further evaluation.  The history is provided by the patient.  Foot Pain This is a new problem. The current episode started 2 days ago. The problem occurs constantly. The problem has not changed since onset.Pertinent negatives include no chest pain, no abdominal pain, no headaches and no shortness of breath. The symptoms are aggravated by walking. Nothing relieves the symptoms. She has tried rest for the symptoms. The treatment provided no relief.       Home Medications Prior to Admission medications   Medication Sig Start Date End Date Taking? Authorizing Provider  albuterol (VENTOLIN HFA) 108 (90 Base) MCG/ACT inhaler Inhale 2 puffs into the lungs every 6 (six) hours as needed for wheezing or shortness of breath (Cough). 11/05/21   Theadora Rama Scales, PA-C  amLODipine (NORVASC) 10 MG tablet Take 1 tablet (10 mg total) by mouth daily. 09/09/21   Wallis Bamberg, PA-C  benzonatate (TESSALON) 200 MG capsule Take 1 capsule (200 mg total) by mouth 3 (three) times daily as needed for cough. 05/21/22   Radford Pax, NP  carvedilol (COREG) 6.25 MG tablet Take 6.25 mg by mouth 2 (two) times daily. 05/16/20   [provider]  EPINEPHrine 0.3 mg/0.3 mL IJ SOAJ injection Inject 0.3 mg into the muscle as needed for  anaphylaxis. 02/13/22   Carmel Sacramento A, PA-C  fluconazole (DIFLUCAN) 150 MG tablet Take 1 tablet (150 mg total) by mouth once a week. 04/17/22   Wallis Bamberg, PA-C  fluticasone (FLONASE) 50 MCG/ACT nasal spray Place 1 spray into both nostrils daily. Begin by using 2 sprays in each nare daily for 3 to 5 days, then decrease to 1 spray in each nare daily. 11/05/21   Theadora Rama Scales, PA-C  furosemide (LASIX) 20 MG tablet Take 1 tablet (20 mg total) by mouth daily. 05/28/21   Geoffery Lyons, MD  gabapentin (NEURONTIN) 100 MG capsule Take 1 capsule (100 mg total) by mouth 3 (three) times daily. 10/15/21   Myra Rude, MD  guaifenesin (HUMIBID E) 400 MG TABS tablet Take 1 tablet 3 times daily as needed for chest congestion and cough 11/05/21   Theadora Rama Scales, PA-C  hydrochlorothiazide (HYDRODIURIL) 25 MG tablet Take 1 tablet (25 mg total) by mouth daily. 07/21/20   Mancel Bale, MD  losartan (COZAAR) 100 MG tablet Take 100 mg by mouth daily.    [provider]  metFORMIN (GLUCOPHAGE-XR) 500 MG 24 hr tablet Take 500 mg by mouth daily. 07/13/21   [provider]  metroNIDAZOLE (FLAGYL) 500 MG tablet Take 1 tablet (500 mg total) by mouth 2 (two) times daily with a meal. DO NOT CONSUME ALCOHOL WHILE TAKING THIS MEDICATION. 04/17/22   Wallis Bamberg, PA-C  Multiple Vitamin (MULTIVITAMIN WITH MINERALS) TABS tablet Take 1 tablet by mouth daily.    [provider]  naproxen (NAPROSYN) 500 MG tablet Take 1 tablet (500 mg total) by mouth 2 (two) times daily with a meal. 01/04/22   Wallis Bamberg, PA-C  OZEMPIC, 0.25 OR 0.5 MG/DOSE, 2 MG/1.5ML SOPN SMARTSIG:0.25 Milligram(s) SUB-Q Once a Week 04/25/21   [provider]  promethazine-dextromethorphan (PROMETHAZINE-DM) 6.25-15 MG/5ML syrup Take 5 mLs by mouth 4 (four) times daily as needed for cough. 11/05/21   Theadora Rama Scales, PA-C  tiZANidine (ZANAFLEX) 4 MG tablet Take 1 tablet (4 mg total) by mouth at bedtime. 01/04/22    Wallis Bamberg, PA-C      Allergies    Morphine and codeine    Review of Systems   Review of Systems  Constitutional:  Negative for fever.  Respiratory:  Negative for shortness of breath.   Cardiovascular:  Negative for chest pain.  Gastrointestinal:  Negative for abdominal pain.  Skin:  Negative for wound.  Neurological:  Positive for numbness. Negative for weakness and headaches.    Physical Exam Updated Vital Signs BP (!) 157/108   Pulse 73   Temp 98.8 F (37.1 C) (Oral)   Wt 105.2 kg   LMP 05/05/2019 (Approximate)   SpO2 99%   BMI 43.84 kg/m  Physical Exam Constitutional:      Appearance: Normal appearance. She is well-developed.  HENT:     Head: Normocephalic and atraumatic.  Eyes:     Conjunctiva/sclera: Conjunctivae normal.  Cardiovascular:     Rate and Rhythm: Normal rate and regular rhythm.     Pulses: Normal pulses.  Pulmonary:     Effort: Pulmonary effort is normal. No respiratory distress.  Musculoskeletal:        General: Tenderness present.     Cervical back: Neck supple.     Comments: Patient is diffusely tender through her left calf.  No cords appreciated.  Achilles is intact.  Distal pulses motor and sensation intact.  No open wounds no erythema.  Skin:    General: Skin is warm and dry.  Neurological:     General: No focal deficit present.     Mental Status: She is alert.     GCS: GCS eye subscore is 4. GCS verbal subscore is 5. GCS motor subscore is 6.     Sensory: No sensory deficit.     Motor: No weakness.     ED Results / Procedures / Treatments   Labs (all labs ordered are listed, but only abnormal results are displayed) Labs Reviewed - No data to display  EKG None  Radiology No results found.  Procedures Procedures  {Document cardiac monitor, telemetry assessment procedure when appropriate:1}  Medications Ordered in ED Medications - No data to display  ED Course/ Medical Decision Making/ A&P   {   Click here for ABCD2,  HEART and other calculatorsREFRESH Note before signing :1}                          Medical Decision Making  ***  {Document critical care time when appropriate:1} {Document review of labs and clinical decision tools ie heart score, Chads2Vasc2 etc:1}  {Document your independent review of radiology images, and any outside records:1} {Document your discussion with family members, caretakers, and with consultants:1} {Document social determinants of health affecting pt's care:1} {Document your decision making why or why not admission, treatments were needed:1} Final Clinical Impression(s) / ED  Diagnoses Final diagnoses:  None    Rx / DC Orders ED Discharge Orders     None

## 2022-08-03 NOTE — Discharge Instructions (Signed)
You were seen in the emergency department for pain in your left leg with some numbness.  You had an ultrasound that showed no evidence of a blood clot.  This is likely muscular and you can try ice or heat to the area, gentle stretching.  Please schedule a follow-up appointment with your primary care doctor.  Return to the emergency department if any worsening or concerning symptoms.

## 2022-08-03 NOTE — ED Triage Notes (Addendum)
Pt presents with c/o lt foot and leg pain X 3 days. Pt states it has been hard to walk. Denies injuries. States she has taken hydrocodone which has not given relief.

## 2022-08-03 NOTE — ED Notes (Signed)
Verbal consent for treatment due to signature pad not working.

## 2022-08-03 NOTE — ED Notes (Signed)
Discharge paperwork reviewed entirely with patient, including follow up care. Pain was under control. No prescriptions were called in, but all questions were addressed.  Pt verbalized understanding as well as all parties involved. No questions or concerns voiced at the time of discharge. No acute distress noted.   Pt ambulated out to PVA without incident or assistance.  

## 2022-08-03 NOTE — ED Triage Notes (Signed)
Pt c/o left foot pain for the past 3 days. Pt states she went to [facility] and they were concerned about a clot and sent her here for evaluation. Pt denies any injury to the foot.

## 2022-08-05 ENCOUNTER — Encounter: Payer: Self-pay | Admitting: Hematology and Oncology

## 2022-08-13 ENCOUNTER — Ambulatory Visit
Admission: RE | Admit: 2022-08-13 | Discharge: 2022-08-13 | Disposition: A | Discharge status: Home / Self Care | Payer: Medicaid Other | Source: Ambulatory Visit | Attending: Nurse Practitioner | Admitting: Nurse Practitioner

## 2022-08-13 VITALS — BP 163/98 | HR 74 | Temp 98.7°F | Resp 18

## 2022-08-13 DIAGNOSIS — M5412 Radiculopathy, cervical region: Secondary | ICD-10-CM

## 2022-08-13 DIAGNOSIS — J4521 Mild intermittent asthma with (acute) exacerbation: Secondary | ICD-10-CM

## 2022-08-13 DIAGNOSIS — J208 Acute bronchitis due to other specified organisms: Secondary | ICD-10-CM

## 2022-08-13 DIAGNOSIS — B9689 Other specified bacterial agents as the cause of diseases classified elsewhere: Secondary | ICD-10-CM

## 2022-08-13 MED ORDER — ALBUTEROL SULFATE HFA 108 (90 BASE) MCG/ACT IN AERS: 1.0000 | INHALATION_SPRAY | Freq: Four times a day (QID) | RESPIRATORY_TRACT | 0 refills | Status: AC | PRN

## 2022-08-13 MED ORDER — PREDNISONE 20 MG PO TABS: 40.0000 mg | ORAL_TABLET | Freq: Every day | ORAL | 0 refills | Status: AC

## 2022-08-13 MED ORDER — BENZONATATE 200 MG PO CAPS: 200.0000 mg | ORAL_CAPSULE | Freq: Three times a day (TID) | ORAL | 0 refills | Status: DC | PRN

## 2022-08-13 MED ORDER — AZITHROMYCIN 250 MG PO TABS: 250.0000 mg | ORAL_TABLET | Freq: Every day | ORAL | 0 refills | Status: DC

## 2022-08-13 NOTE — Discharge Instructions (Signed)
Start Zithromax as prescribed.  Start prednisone daily for 5 days to help with your asthma symptoms.  I have also refilled your albuterol inhaler.  Take Tessalon as needed for cough.  Lots of rest and fluids.  Please follow-up with your PCP if your symptoms do not improve in you should also contact them regarding your referral for your cervical radiculopathy that is causing your hand numbness.  Please go to the emergency room if you develop any worsening symptoms.  I hope you feel better soon!

## 2022-08-13 NOTE — ED Triage Notes (Signed)
Cough x4 weeks, fatigue, pain with coughing that started this week, pt states she has been using her inhaler more often. Pt also states she is having numbness in her hands that started a month ago.

## 2022-08-13 NOTE — ED Provider Notes (Signed)
[facility]    CSN: [id number] Arrival date & time: 08/13/22  0851      History   Chief Complaint Chief Complaint  Patient presents with   Cough    Pain - Entered by patient    HPI [name] is a 37 y.o. female  presents for evaluation of URI symptoms for 1 month. Patient reports associated symptoms of dry cough with wheezing/shortness of breath/chest tightness. Denies N/V/D, fevers, sore throat, ear pain, sinus pressure/pain, congestion, body aches. Patient does have a hx of asthma.  Has an albuterol inhaler and home nebulizer which she has been using more often with some temporary improvement in symptoms.  No known sick contacts.  She did do a telehealth visit on 6/14 for same symptoms.  Given her reported symptoms advise she go to the ER or to urgent care for further evaluation.  Patient did not do this at that time.  In addition patient does have a history of cervical radiculopathy and reports she has been having increased numbness that is intermittent in both hands.  States she was given a referral by her PCP but nobody ever called her and she did not follow-up on this.  No new injuries.  Pt has taken NyQuil OTC for symptoms. Pt has no other concerns at this time.    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/2019   Iron deficiency anemia due to chronic blood loss 08/31/2018   Iron deficiency anemia 04/27/2018   Leiomyoma of uterus, unspecified 06/15/2016   Anemia, deficiency 05/13/2015   DDD (degenerative disc disease), lumbar 05/16/2013   BMI 40.0-44.9, adult (HCC) 12/17/2012    OSA on CPAP 12/17/2012   Depression 05/11/2012   Diabetes mellitus type 2 in obese 12/23/2011   Hypertension 12/23/2011   Female pelvic pain 12/23/2011    Past Surgical History:  Procedure Laterality Date   ABDOMINAL HYSTERECTOMY     GALLBLADDER SURGERY     REPEAT CESAREAN SECTION      OB History     Gravida  3   Para      Term      Preterm      AB  0   Living  3      SAB  0   IAB  0   Ectopic  0   Multiple  0   Live Births  3            Home Medications    Prior to Admission medications   Medication Sig Start Date End Date Taking? Authorizing Provider  albuterol (VENTOLIN HFA) 108 (90 Base) MCG/ACT inhaler Inhale 1-2 puffs into the lungs every 6 (six) hours as needed for wheezing or shortness of breath. 08/13/22  Yes Radford Pax, NP  amLODipine (NORVASC) 10 MG tablet Take 1 tablet (10 mg total) by mouth daily. 09/09/21  Yes Wallis Bamberg, PA-C  azithromycin (ZITHROMAX) 250 MG tablet Take 1 tablet (250 mg total) by mouth daily. Take first 2 tablets together, then 1 every day until finished. 08/13/22  Yes  Radford Pax, NP  benzonatate (TESSALON) 200 MG capsule Take 1 capsule (200 mg total) by mouth 3 (three) times daily as needed for cough. 08/13/22  Yes Radford Pax, NP  carvedilol (COREG) 6.25 MG tablet Take 6.25 mg by mouth 2 (two) times daily. 05/16/20  Yes [provider]  fluticasone (FLONASE) 50 MCG/ACT nasal spray Place 1 spray into both nostrils daily. Begin by using 2 sprays in each nare daily for 3 to 5 days, then decrease to 1 spray in each nare daily. 11/05/21  Yes Theadora Rama Scales, PA-C  furosemide (LASIX) 20 MG tablet Take 1 tablet (20 mg total) by mouth daily. 05/28/21  Yes Delo, Riley Lam, MD  gabapentin (NEURONTIN) 100 MG capsule Take 1 capsule (100 mg total) by mouth 3 (three) times daily. 10/15/21  Yes Myra Rude, MD  hydrochlorothiazide (HYDRODIURIL) 25 MG tablet Take 1 tablet (25 mg total) by mouth daily. 07/21/20  Yes Mancel Bale, MD  losartan (COZAAR) 100 MG tablet Take 100 mg by mouth daily.   Yes [provider]  metFORMIN (GLUCOPHAGE-XR) 500 MG 24 hr tablet Take 500 mg by mouth daily. 07/13/21  Yes [provider]  Multiple Vitamin (MULTIVITAMIN WITH MINERALS) TABS tablet Take 1 tablet by mouth daily.   Yes [provider]  OZEMPIC, 0.25 OR 0.5 MG/DOSE, 2 MG/1.5ML SOPN SMARTSIG:0.25 Milligram(s) SUB-Q Once a Week 04/25/21  Yes [provider]  predniSONE (DELTASONE) 20 MG tablet Take 2 tablets (40 mg total) by mouth daily with breakfast for 5 days. 08/13/22 08/18/22 Yes Radford Pax, NP  EPINEPHrine 0.3 mg/0.3 mL IJ SOAJ injection Inject 0.3 mg into the muscle as needed for anaphylaxis. 02/13/22   Carmel Sacramento A, PA-C  fluconazole (DIFLUCAN) 150 MG tablet Take 1 tablet (150 mg total) by mouth once a week. 04/17/22   Wallis Bamberg, PA-C  guaifenesin (HUMIBID E) 400 MG TABS tablet Take 1 tablet 3 times daily as needed for chest congestion and cough 11/05/21   Theadora Rama Scales, PA-C  metroNIDAZOLE (FLAGYL) 500 MG tablet Take 1 tablet (500 mg total) by mouth 2 (two) times daily with a meal. DO NOT CONSUME ALCOHOL WHILE TAKING THIS MEDICATION. 04/17/22   Wallis Bamberg, PA-C  naproxen (NAPROSYN) 500 MG tablet Take 1 tablet (500 mg total) by mouth 2 (two) times daily with a meal. 01/04/22   Wallis Bamberg, PA-C  promethazine-dextromethorphan (PROMETHAZINE-DM) 6.25-15 MG/5ML syrup Take 5 mLs by mouth 4 (four) times daily as needed for cough. 11/05/21   Theadora Rama Scales, PA-C  tiZANidine (ZANAFLEX) 4 MG tablet Take 1 tablet (4 mg total) by mouth at bedtime. 01/04/22   Wallis Bamberg, PA-C    Family History Family History  Problem Relation Age of Onset   Breast cancer Other    Heart disease Mother    Lupus Maternal Aunt    Stomach cancer Maternal Aunt    Colon cancer Neg Hx    Esophageal cancer Neg Hx    Rectal cancer Neg Hx     Social History Social History   Tobacco Use    Smoking status: Never   Smokeless tobacco: Never  Vaping Use   Vaping Use: Never used  Substance Use Topics   Alcohol use: Never   Drug use: Never     Allergies   Morphine and codeine   Review of Systems Review of Systems  Respiratory:  Positive for cough.   Neurological:  Positive for numbness.     Physical Exam Triage  Vital Signs ED Triage Vitals [08/13/22 0902]  Enc Vitals Group     BP (!) 163/98     Pulse Rate 74     Resp 18     Temp 98.7 F (37.1 C)     Temp Source Oral     SpO2 96 %     Weight      Height      Head Circumference      Peak Flow      Pain Score 0     Pain Loc      Pain Edu?      Excl. in GC?    No data found.  Updated Vital Signs BP (!) 163/98 (BP Location: Right Arm)   Pulse 74   Temp 98.7 F (37.1 C) (Oral)   Resp 18   LMP 05/05/2019 (Approximate)   SpO2 96%   Visual Acuity Right Eye Distance:   Left Eye Distance:   Bilateral Distance:    Right Eye Near:   Left Eye Near:    Bilateral Near:     Physical Exam Vitals and nursing note reviewed.  Constitutional:      General: She is not in acute distress.    Appearance: Normal appearance. She is well-developed. She is not ill-appearing.  HENT:     Head: Normocephalic and atraumatic.     Right Ear: Tympanic membrane and ear canal normal.     Left Ear: Tympanic membrane and ear canal normal.     Nose: Rhinorrhea present. No congestion.     Mouth/Throat:     Mouth: Mucous membranes are moist.     Pharynx: Oropharynx is clear. Uvula midline. No posterior oropharyngeal erythema.     Tonsils: No tonsillar exudate or tonsillar abscesses.  Eyes:     Conjunctiva/sclera: Conjunctivae normal.     Pupils: Pupils are equal, round, and reactive to light.  Neck:     Comments: Grip strength bilateral upper extremities 5 out of 5 Cardiovascular:     Rate and Rhythm: Normal rate and regular rhythm.     Heart sounds: Normal heart sounds.  Pulmonary:     Effort: Pulmonary effort is  normal.     Breath sounds: Normal breath sounds. No wheezing.  Musculoskeletal:     Cervical back: Normal range of motion and neck supple. No rigidity or torticollis. No pain with movement, spinous process tenderness or muscular tenderness.  Lymphadenopathy:     Cervical: No cervical adenopathy.  Skin:    General: Skin is warm and dry.  Neurological:     General: No focal deficit present.     Mental Status: She is alert and oriented to person, place, and time.  Psychiatric:        Mood and Affect: Mood normal.        Behavior: Behavior normal.      UC Treatments / Results  Labs (all labs ordered are listed, but only abnormal results are displayed) Labs Reviewed - No data to display  EKG   Radiology No results found.  Procedures Procedures (including critical care time)  Medications Ordered in UC Medications - No data to display  Initial Impression / Assessment and Plan / UC Course  I have reviewed the triage vital signs and the nursing notes.  Pertinent labs & imaging results that were available during my care of the patient were reviewed by me and considered in my medical decision making (see chart for details).     Reviewed exam and  symptoms with patient.  No red flags.  Given length of symptoms for cough start Zithromax.  Prednisone daily for asthma flare.  Refilled her albuterol inhaler as requested.  Tessalon as needed for cough.  Discussed her chronic radiculopathy and advised her to contact her PCP to follow-up on her referral.  She should also follow-up with her PCP if her respiratory symptoms do not improve with treatment.  Strict ER precautions reviewed and patient verbalized understanding Final Clinical Impressions(s) / UC Diagnoses   Final diagnoses:  Acute bacterial bronchitis  Mild intermittent asthma with acute exacerbation  Cervical radiculopathy     Discharge Instructions      Start Zithromax as prescribed.  Start prednisone daily for 5 days to  help with your asthma symptoms.  I have also refilled your albuterol inhaler.  Take Tessalon as needed for cough.  Lots of rest and fluids.  Please follow-up with your PCP if your symptoms do not improve in you should also contact them regarding your referral for your cervical radiculopathy that is causing your hand numbness.  Please go to the emergency room if you develop any worsening symptoms.  I hope you feel better soon!    ED Prescriptions     Medication Sig Dispense Auth. Provider   azithromycin (ZITHROMAX) 250 MG tablet Take 1 tablet (250 mg total) by mouth daily. Take first 2 tablets together, then 1 every day until finished. 6 tablet Radford Pax, NP   predniSONE (DELTASONE) 20 MG tablet Take 2 tablets (40 mg total) by mouth daily with breakfast for 5 days. 10 tablet Radford Pax, NP   albuterol (VENTOLIN HFA) 108 (90 Base) MCG/ACT inhaler Inhale 1-2 puffs into the lungs every 6 (six) hours as needed for wheezing or shortness of breath. 1 each Radford Pax, NP   benzonatate (TESSALON) 200 MG capsule Take 1 capsule (200 mg total) by mouth 3 (three) times daily as needed for cough. 20 capsule Radford Pax, NP      PDMP not reviewed this encounter.   Radford Pax, NP 08/13/22 [phone]

## 2022-09-04 ENCOUNTER — Ambulatory Visit: Payer: Self-pay

## 2022-10-07 ENCOUNTER — Ambulatory Visit: Payer: Self-pay

## 2022-10-08 ENCOUNTER — Ambulatory Visit
Admission: RE | Admit: 2022-10-08 | Discharge: 2022-10-08 | Disposition: A | Discharge status: Home / Self Care | Payer: Medicaid Other | Source: Ambulatory Visit | Attending: Internal Medicine | Admitting: Internal Medicine

## 2022-10-08 VITALS — BP 161/99 | HR 71 | Temp 99.0°F | Resp 18 | Ht 61.0 in | Wt 226.0 lb

## 2022-10-08 DIAGNOSIS — K5900 Constipation, unspecified: Secondary | ICD-10-CM | POA: Diagnosis present

## 2022-10-08 DIAGNOSIS — N898 Other specified noninflammatory disorders of vagina: Secondary | ICD-10-CM | POA: Diagnosis present

## 2022-10-08 DIAGNOSIS — Z113 Encounter for screening for infections with a predominantly sexual mode of transmission: Secondary | ICD-10-CM | POA: Diagnosis present

## 2022-10-08 LAB — POCT URINALYSIS DIP (MANUAL ENTRY)
Bilirubin, UA: NEGATIVE
Blood, UA: NEGATIVE
Glucose, UA: NEGATIVE mg/dL
Ketones, POC UA: NEGATIVE mg/dL
Leukocytes, UA: NEGATIVE
Nitrite, UA: NEGATIVE
Protein Ur, POC: 100 mg/dL — AB
Spec Grav, UA: 1.025 (ref 1.010–1.025)
Urobilinogen, UA: 0.2 E.U./dL
pH, UA: 6.5 (ref 5.0–8.0)

## 2022-10-08 MED ORDER — FLUCONAZOLE 150 MG PO TABS
150.0000 mg | ORAL_TABLET | Freq: Every day | ORAL | 0 refills | Status: DC
Start: 2022-10-08 — End: 2023-07-06

## 2022-10-08 NOTE — Discharge Instructions (Addendum)
The clinic will contact you with results of the STD testing done today if positive.  Start Diflucan to treat potential yeast infection.  Your constipation is likely due to your Ozempic usage.  Even though you have already stopped this it may take some time for your body to readjust.  You can use MiraLAX daily as well as increasing fluid and fiber intake.  Please follow-up with your PCP if your symptoms do not improve.  Please go to the emergency room for any worsening symptoms.  I hope you feel better soon!

## 2022-10-08 NOTE — ED Provider Notes (Signed)
[facility]    CSN: [id number] Arrival date & time: 10/08/22  1050      History   Chief Complaint No chief complaint on file.   HPI Elaine Perez is a 37 y.o. female presents for evaluation of vaginal discharge and constipation.  Patient reports 4 days of a mildly pruritic vaginal discharge.  She denies any dysuria, fevers, nausea/vomiting, flank pain.  No STD exposure but she would like screening.  She has been using boric acid OTC.  Does have a history of BV and yeast infections in the past.  In addition she states she was on Ozempic and stopped it last week due to side effects.  She is constipation while on it and since she has stopped it.  Reports last bowel movement was several days ago.  Does endorse passing gas.  She took MiraLAX on two occasions as well as an enema once without improvement.  No nausea or vomiting.  No abdominal pain.  Patient has had a hysterectomy.  She taking fluids normally.  No other concerns at this time.  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 07/04/2019   Iron deficiency anemia due to chronic blood loss 08/31/2018   Iron deficiency anemia 04/27/2018   Leiomyoma of uterus, unspecified 06/15/2016   Anemia, deficiency 05/13/2015   DDD (degenerative disc disease), lumbar 05/16/2013   BMI 40.0-44.9, adult (HCC) 12/17/2012   OSA on CPAP 12/17/2012   Depression 05/11/2012   Diabetes mellitus type 2 in obese 12/23/2011   Hypertension 12/23/2011   Female pelvic pain 12/23/2011    Past Surgical History:  Procedure Laterality Date   ABDOMINAL HYSTERECTOMY      GALLBLADDER SURGERY     REPEAT CESAREAN SECTION      OB History     Gravida  3   Para      Term      Preterm      AB  0   Living  3      SAB  0   IAB  0   Ectopic  0   Multiple  0   Live Births  3            Home Medications    Prior to Admission medications   Medication Sig Start Date End Date Taking? Authorizing Provider  fluconazole (DIFLUCAN) 150 MG tablet Take 1 tablet (150 mg total) by mouth daily. 10/08/22  Yes Radford Pax, NP  albuterol (VENTOLIN HFA) 108 (90 Base) MCG/ACT inhaler Inhale 1-2 puffs into the lungs every 6 (six) hours as needed for wheezing or shortness of breath. 08/13/22   Radford Pax, NP  amLODipine (NORVASC) 10 MG tablet Take 1 tablet (10 mg total) by mouth daily. 09/09/21   Wallis Bamberg, PA-C  azithromycin (ZITHROMAX) 250 MG tablet Take 1 tablet (250 mg total) by mouth daily. Take first 2 tablets together, then 1 every day until finished. 08/13/22   Radford Pax, NP  benzonatate (TESSALON) 200 MG capsule Take 1 capsule (200 mg total) by mouth 3 (three) times daily  as needed for cough. 08/13/22   Radford Pax, NP  carvedilol (COREG) 6.25 MG tablet Take 6.25 mg by mouth 2 (two) times daily. 05/16/20   [provider]  EPINEPHrine 0.3 mg/0.3 mL IJ SOAJ injection Inject 0.3 mg into the muscle as needed for anaphylaxis. 02/13/22   Carmel Sacramento A, PA-C  fluticasone (FLONASE) 50 MCG/ACT nasal spray Place 1 spray into both nostrils daily. Begin by using 2 sprays in each nare daily for 3 to 5 days, then decrease to 1 spray in each nare daily. 11/05/21   Theadora Rama Scales, PA-C  furosemide (LASIX) 20 MG tablet Take 1 tablet (20 mg total) by mouth daily. 05/28/21   Geoffery Lyons, MD  gabapentin (NEURONTIN) 100 MG capsule Take 1 capsule (100 mg total) by mouth 3 (three) times daily. 10/15/21   Myra Rude, MD  guaifenesin (HUMIBID E) 400 MG TABS tablet Take 1 tablet 3 times daily as needed for chest congestion and cough 11/05/21    Theadora Rama Scales, PA-C  hydrochlorothiazide (HYDRODIURIL) 25 MG tablet Take 1 tablet (25 mg total) by mouth daily. 07/21/20   Mancel Bale, MD  losartan (COZAAR) 100 MG tablet Take 100 mg by mouth daily.    [provider]  metFORMIN (GLUCOPHAGE-XR) 500 MG 24 hr tablet Take 500 mg by mouth daily. 07/13/21   [provider]  metroNIDAZOLE (FLAGYL) 500 MG tablet Take 1 tablet (500 mg total) by mouth 2 (two) times daily with a meal. DO NOT CONSUME ALCOHOL WHILE TAKING THIS MEDICATION. 04/17/22   Wallis Bamberg, PA-C  Multiple Vitamin (MULTIVITAMIN WITH MINERALS) TABS tablet Take 1 tablet by mouth daily.    [provider]  naproxen (NAPROSYN) 500 MG tablet Take 1 tablet (500 mg total) by mouth 2 (two) times daily with a meal. 01/04/22   Wallis Bamberg, PA-C  OZEMPIC, 0.25 OR 0.5 MG/DOSE, 2 MG/1.5ML SOPN SMARTSIG:0.25 Milligram(s) SUB-Q Once a Week 04/25/21   [provider]  promethazine-dextromethorphan (PROMETHAZINE-DM) 6.25-15 MG/5ML syrup Take 5 mLs by mouth 4 (four) times daily as needed for cough. 11/05/21   Theadora Rama Scales, PA-C  tiZANidine (ZANAFLEX) 4 MG tablet Take 1 tablet (4 mg total) by mouth at bedtime. 01/04/22   Wallis Bamberg, PA-C    Family History Family History  Problem Relation Age of Onset   Breast cancer Other    Heart disease Mother    Lupus Maternal Aunt    Stomach cancer Maternal Aunt    Colon cancer Neg Hx    Esophageal cancer Neg Hx    Rectal cancer Neg Hx     Social History Social History   Tobacco Use   Smoking status: Never   Smokeless tobacco: Never  Vaping Use   Vaping status: Never Used  Substance Use Topics   Alcohol use: Never   Drug use: Never     Allergies   Morphine and codeine   Review of Systems Review of Systems  Gastrointestinal:  Positive for constipation.  Genitourinary:  Positive for vaginal discharge.     Physical Exam Triage Vital Signs ED Triage Vitals  Encounter Vitals Group     BP  10/08/22 1101 (!) 161/99     Systolic BP Percentile --      Diastolic BP Percentile --      Pulse Rate 10/08/22 1101 71     Resp 10/08/22 1101 18     Temp 10/08/22 1059 99 F (37.2 C)     Temp Source 10/08/22 1059  Oral     SpO2 10/08/22 1101 97 %     Weight 10/08/22 1103 226 lb (102.5 kg)     Height 10/08/22 1103 5\' 1"  (1.549 m)     Head Circumference --      Peak Flow --      Pain Score 10/08/22 1102 8     Pain Loc --      Pain Education --      Exclude from Growth Chart --    No data found.  Updated Vital Signs BP (!) 161/99 (BP Location: Left Arm)   Pulse 71   Temp 99 F (37.2 C) (Oral)   Resp 18   Ht 5\' 1"  (1.549 m)   Wt 226 lb (102.5 kg)   LMP 05/05/2019 (Approximate)   SpO2 97%   BMI 42.70 kg/m   Visual Acuity Right Eye Distance:   Left Eye Distance:   Bilateral Distance:    Right Eye Near:   Left Eye Near:    Bilateral Near:     Physical Exam Vitals and nursing note reviewed.  Constitutional:      General: She is not in acute distress.    Appearance: Normal appearance. She is not ill-appearing.  HENT:     Head: Normocephalic and atraumatic.  Eyes:     Pupils: Pupils are equal, round, and reactive to light.  Cardiovascular:     Rate and Rhythm: Normal rate.  Pulmonary:     Effort: Pulmonary effort is normal.  Abdominal:     General: Bowel sounds are normal. There is no distension.     Palpations: Abdomen is soft.     Tenderness: There is no abdominal tenderness. There is no right CVA tenderness or left CVA tenderness.  Skin:    General: Skin is warm and dry.  Neurological:     General: No focal deficit present.     Mental Status: She is alert and oriented to person, place, and time.  Psychiatric:        Mood and Affect: Mood normal.        Behavior: Behavior normal.      UC Treatments / Results  Labs (all labs ordered are listed, but only abnormal results are displayed) Labs Reviewed  POCT URINALYSIS DIP (MANUAL ENTRY) - Abnormal;  Notable for the following components:      Result Value   Clarity, UA cloudy (*)    Protein Ur, POC =100 (*)    All other components within normal limits  RPR  HIV ANTIBODY (ROUTINE TESTING W REFLEX)  CERVICOVAGINAL ANCILLARY ONLY    EKG   Radiology No results found.  Procedures Procedures (including critical care time)  Medications Ordered in UC Medications - No data to display  Initial Impression / Assessment and Plan / UC Course  I have reviewed the triage vital signs and the nursing notes.  Pertinent labs & imaging results that were available during my care of the patient were reviewed by me and considered in my medical decision making (see chart for details).     Reviewed exam and sx with patient.  UA negative for UTI.  Vaginal swab/STD testing is ordered and will contact for any positive results.  Will start Diflucan for vaginal discharge.  Reviewed constipation with patient.  I advise she take otc MiraLAX daily as well as increase her fluid intake and fiber intake.  Discussed as the Ozempic effects wear off her bowel movements should return to normal.  Advised PCP follow-up  as symptoms do not improve.  Strict ER precautions reviewed and patient verbalized understanding. Final Clinical Impressions(s) / UC Diagnoses   Final diagnoses:  Vaginal discharge  Screening examination for STD (sexually transmitted disease)  Constipation, unspecified constipation type     Discharge Instructions      The clinic will contact you with results of the STD testing done today if positive.  Start Diflucan to treat potential yeast infection.  Your constipation is likely due to your Ozempic usage.  Even though you have already stopped this it may take some time for your body to readjust.  You can use MiraLAX daily as well as increasing fluid and fiber intake.  Please follow-up with your PCP if your symptoms do not improve.  Please go to the emergency room for any worsening symptoms.  I  hope you feel better soon!    ED Prescriptions     Medication Sig Dispense Auth. Provider   fluconazole (DIFLUCAN) 150 MG tablet Take 1 tablet (150 mg total) by mouth daily. 1 tablet Radford Pax, NP      PDMP not reviewed this encounter.   Radford Pax, NP 10/08/22 1134

## 2022-10-08 NOTE — ED Triage Notes (Addendum)
Patient presents with lower back pain, discharge and constipation x day 4.  Patient requesting cytology swab.

## 2022-10-09 LAB — HIV ANTIBODY (ROUTINE TESTING W REFLEX): HIV Screen 4th Generation wRfx: NONREACTIVE

## 2022-10-09 LAB — RPR: RPR Ser Ql: NONREACTIVE

## 2022-10-10 LAB — CERVICOVAGINAL ANCILLARY ONLY
Bacterial Vaginitis (gardnerella): NEGATIVE
Candida Glabrata: NEGATIVE
Candida Vaginitis: NEGATIVE
Chlamydia: NEGATIVE
Comment: NEGATIVE
Comment: NEGATIVE
Comment: NEGATIVE
Comment: NEGATIVE
Comment: NEGATIVE
Comment: NORMAL
Neisseria Gonorrhea: NEGATIVE
Trichomonas: POSITIVE — AB

## 2022-10-11 ENCOUNTER — Telehealth (HOSPITAL_COMMUNITY): Payer: Self-pay | Admitting: Emergency Medicine

## 2022-10-11 MED ORDER — METRONIDAZOLE 500 MG PO TABS: 500.0000 mg | ORAL_TABLET | Freq: Two times a day (BID) | ORAL | 0 refills | Status: DC

## 2022-10-11 NOTE — Telephone Encounter (Signed)
Per protocol, patient will need treatment with Metronidazole Attempted to reach patient x 1, LVM Prescription sent to pharmacy on file

## 2022-10-13 ENCOUNTER — Telehealth: Payer: Self-pay

## 2022-10-13 MED ORDER — METRONIDAZOLE 500 MG PO TABS: 500.0000 mg | ORAL_TABLET | Freq: Two times a day (BID) | ORAL | 0 refills | Status: DC

## 2022-10-13 NOTE — Telephone Encounter (Signed)
Pt called and stated pharmacy did not receive script. Rx sent

## 2022-11-04 ENCOUNTER — Ambulatory Visit: Payer: Medicaid Other | Admitting: Podiatry

## 2022-11-04 ENCOUNTER — Encounter: Payer: Self-pay | Admitting: Podiatry

## 2022-11-04 DIAGNOSIS — Q666 Other congenital valgus deformities of feet: Secondary | ICD-10-CM

## 2022-11-04 NOTE — Progress Notes (Signed)
Subjective:  Patient ID: Elaine Perez, female    DOB: Nov 29, 1985,  MRN: [id number]  Chief Complaint  Patient presents with   Foot Pain    Bilateral foot pain  Right foot is worse     37 y.o. female presents with the above complaint.  Patient presents with bilateral flatfoot deformity.  Patient states painful to touch is progressive gotten worse worse with ambulation worse with pressure hurts in the arches and the bilateral plantar foot.  She does not wear any orthotics.  Would like to get some.  She has not seen anyone as prior to seeing me for this.  She denies any other acute complaints she is a diabetic   Review of Systems: Negative except as noted in the HPI. Denies N/V/F/Ch.  Past Medical History:  Diagnosis Date   Asthma    Blood transfusion without reported diagnosis 2019   Diabetes mellitus without complication (HCC)    Fibroids    Hypertension    Iron deficiency anemia 04/27/2018   Sleep apnea    Does not use CPAP    Current Outpatient Medications:    albuterol (VENTOLIN HFA) 108 (90 Base) MCG/ACT inhaler, Inhale 1-2 puffs into the lungs every 6 (six) hours as needed for wheezing or shortness of breath., Disp: 1 each, Rfl: 0   amLODipine (NORVASC) 10 MG tablet, Take 1 tablet (10 mg total) by mouth daily., Disp: 90 tablet, Rfl: 0   azithromycin (ZITHROMAX) 250 MG tablet, Take 1 tablet (250 mg total) by mouth daily. Take first 2 tablets together, then 1 every day until finished., Disp: 6 tablet, Rfl: 0   benzonatate (TESSALON) 200 MG capsule, Take 1 capsule (200 mg total) by mouth 3 (three) times daily as needed for cough., Disp: 20 capsule, Rfl: 0   carvedilol (COREG) 6.25 MG tablet, Take 6.25 mg by mouth 2 (two) times daily., Disp: , Rfl:    EPINEPHrine 0.3 mg/0.3 mL IJ SOAJ injection, Inject 0.3 mg into the muscle as needed for anaphylaxis., Disp: 2 each, Rfl: 0   fluconazole (DIFLUCAN) 150 MG tablet, Take 1 tablet (150 mg total) by mouth daily., Disp: 1 tablet, Rfl: 0    fluticasone (FLONASE) 50 MCG/ACT nasal spray, Place 1 spray into both nostrils daily. Begin by using 2 sprays in each nare daily for 3 to 5 days, then decrease to 1 spray in each nare daily., Disp: 15.8 mL, Rfl: 2   furosemide (LASIX) 20 MG tablet, Take 1 tablet (20 mg total) by mouth daily., Disp: 30 tablet, Rfl: 0   gabapentin (NEURONTIN) 100 MG capsule, Take 1 capsule (100 mg total) by mouth 3 (three) times daily., Disp: 60 capsule, Rfl: 1   guaifenesin (HUMIBID E) 400 MG TABS tablet, Take 1 tablet 3 times daily as needed for chest congestion and cough, Disp: 21 tablet, Rfl: 0   hydrochlorothiazide (HYDRODIURIL) 25 MG tablet, Take 1 tablet (25 mg total) by mouth daily., Disp: 30 tablet, Rfl: 1   losartan (COZAAR) 100 MG tablet, Take 100 mg by mouth daily., Disp: , Rfl:    metFORMIN (GLUCOPHAGE-XR) 500 MG 24 hr tablet, Take 500 mg by mouth daily., Disp: , Rfl:    metroNIDAZOLE (FLAGYL) 500 MG tablet, Take 1 tablet (500 mg total) by mouth 2 (two) times daily., Disp: 14 tablet, Rfl: 0   Multiple Vitamin (MULTIVITAMIN WITH MINERALS) TABS tablet, Take 1 tablet by mouth daily., Disp: , Rfl:    naproxen (NAPROSYN) 500 MG tablet, Take 1 tablet (500 mg total) by  mouth 2 (two) times daily with a meal., Disp: 30 tablet, Rfl: 0   OZEMPIC, 0.25 OR 0.5 MG/DOSE, 2 MG/1.5ML SOPN, SMARTSIG:0.25 Milligram(s) SUB-Q Once a Week, Disp: , Rfl:    promethazine-dextromethorphan (PROMETHAZINE-DM) 6.25-15 MG/5ML syrup, Take 5 mLs by mouth 4 (four) times daily as needed for cough., Disp: 118 mL, Rfl: 0   tiZANidine (ZANAFLEX) 4 MG tablet, Take 1 tablet (4 mg total) by mouth at bedtime., Disp: 30 tablet, Rfl: 0  Social History   Tobacco Use  Smoking Status Never  Smokeless Tobacco Never    Allergies  Allergen Reactions   Morphine And Codeine Itching and Rash    swelling swelling swelling swelling    Objective:  There were no vitals filed for this visit. There is no height or weight on file to calculate  BMI. Constitutional Well developed. Well nourished.  Vascular Dorsalis pedis pulses palpable bilaterally. Posterior tibial pulses palpable bilaterally. Capillary refill normal to all digits.  No cyanosis or clubbing noted. Pedal hair growth normal.  Neurologic Normal speech. Oriented to person, place, and time. Epicritic sensation to light touch grossly present bilaterally.  Dermatologic Nails well groomed and normal in appearance. No open wounds. No skin lesions.  Orthopedic: Gait examination shows bilateral pes planovalgus deformity with calcaneal valgus to many toe signs unable to recreate the arch with dorsiflexion of the hallux unable to perform single and double heel raise.   Radiographs: None Assessment:   1. Pes planovalgus    Plan:  Patient was evaluated and treated and all questions answered.  Pes planovalgus -I explained to patient the etiology of pes planovalgus and relationship with Planter fasciitis and various treatment options were discussed.  Given patient foot structure in the setting of Planter fasciitis I believe patient will benefit from custom-made orthotics to help control the hindfoot motion support the arch of the foot and take the stress away from plantar fascial.  Patient agrees with the plan like to proceed with orthotics -Patient was casted for orthotics  -  No follow-ups on file.

## 2022-11-10 ENCOUNTER — Ambulatory Visit (HOSPITAL_COMMUNITY): Payer: Self-pay

## 2022-11-11 ENCOUNTER — Other Ambulatory Visit: Payer: Self-pay

## 2022-11-11 ENCOUNTER — Ambulatory Visit: Payer: Medicaid Other

## 2022-11-11 ENCOUNTER — Ambulatory Visit
Admission: RE | Admit: 2022-11-11 | Discharge: 2022-11-11 | Disposition: A | Discharge status: Home / Self Care | Payer: Medicaid Other | Source: Ambulatory Visit | Attending: Family Medicine | Admitting: Family Medicine

## 2022-11-11 VITALS — BP 171/96 | HR 79 | Temp 98.3°F | Resp 16

## 2022-11-11 DIAGNOSIS — R109 Unspecified abdominal pain: Secondary | ICD-10-CM | POA: Diagnosis not present

## 2022-11-11 DIAGNOSIS — R1084 Generalized abdominal pain: Secondary | ICD-10-CM | POA: Diagnosis present

## 2022-11-11 DIAGNOSIS — Z113 Encounter for screening for infections with a predominantly sexual mode of transmission: Secondary | ICD-10-CM | POA: Diagnosis not present

## 2022-11-11 LAB — POCT URINALYSIS DIP (MANUAL ENTRY)
Bilirubin, UA: NEGATIVE
Blood, UA: NEGATIVE
Glucose, UA: NEGATIVE mg/dL
Leukocytes, UA: NEGATIVE
Nitrite, UA: NEGATIVE
Protein Ur, POC: 100 mg/dL — AB
Spec Grav, UA: >=1.03 — AB (ref 1.010–1.025)
Urobilinogen, UA: 0.2 U/dL
pH, UA: 5.5 (ref 5.0–8.0)

## 2022-11-11 MED ORDER — AMOXICILLIN-POT CLAVULANATE 875-125 MG PO TABS: ORAL_TABLET | ORAL | 0 refills | Status: DC

## 2022-11-11 NOTE — ED Triage Notes (Addendum)
Low abd cramping, feeling hot x 3-4 days. Wants to be checked for uti and stds

## 2022-11-11 NOTE — ED Provider Notes (Signed)
[facility]    CSN: [id number] Arrival date & time: 11/11/22  1705      History   Chief Complaint Chief Complaint  Patient presents with   Abdominal Pain    UTI - Entered by patient    HPI Elaine Perez is a 37 y.o. female.    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 07/04/2019   Iron deficiency anemia due to chronic blood loss 08/31/2018   Iron deficiency anemia 04/27/2018   Leiomyoma of uterus, unspecified 06/15/2016   Anemia, deficiency 05/13/2015   DDD (degenerative disc disease), lumbar 05/16/2013   BMI 40.0-44.9, adult (HCC) 12/17/2012   OSA on CPAP 12/17/2012   Depression 05/11/2012   Diabetes mellitus type 2 in obese 12/23/2011   Hypertension 12/23/2011   Female pelvic pain 12/23/2011    Past Surgical History:  Procedure Laterality Date   ABDOMINAL HYSTERECTOMY     GALLBLADDER SURGERY     REPEAT CESAREAN SECTION      OB History     Gravida  3   Para      Term      Preterm      AB  0   Living  3      SAB  0   IAB  0   Ectopic  0   Multiple  0   Live Births  3            Home Medications    Prior to Admission medications   Medication Sig Start Date End Date Taking? Authorizing Provider  amoxicillin-clavulanate (AUGMENTIN) 875-125 MG tablet Take one tab PO Q8hr. 11/11/22  Yes Shanta Hartner, Tera Mater, MD  albuterol (VENTOLIN HFA) 108 (90 Base) MCG/ACT inhaler Inhale 1-2 puffs into the lungs every 6 (six) hours as needed for wheezing or shortness of breath. 08/13/22   Radford Pax, NP  amLODipine (NORVASC) 10 MG tablet Take 1 tablet (10  mg total) by mouth daily. 09/09/21   Wallis Bamberg, PA-C  benzonatate (TESSALON) 200 MG capsule Take 1 capsule (200 mg total) by mouth 3 (three) times daily as needed for cough. 08/13/22   Radford Pax, NP  carvedilol (COREG) 6.25 MG tablet Take 6.25 mg by mouth 2 (two) times daily. 05/16/20   [provider]  EPINEPHrine 0.3 mg/0.3 mL IJ SOAJ injection Inject 0.3 mg into the muscle as needed for anaphylaxis. 02/13/22   Carmel Sacramento A, PA-C  fluconazole (DIFLUCAN) 150 MG tablet Take 1 tablet (150 mg total) by mouth daily. 10/08/22   Radford Pax, NP  fluticasone (FLONASE) 50 MCG/ACT nasal spray Place 1 spray into both nostrils daily. Begin by using 2 sprays in each nare daily for 3 to 5 days, then decrease to 1 spray in each nare daily. 11/05/21   Theadora Rama Scales, PA-C  furosemide (LASIX) 20 MG tablet Take 1 tablet (20 mg total) by mouth daily. 05/28/21   Geoffery Lyons, MD  gabapentin (NEURONTIN) 100 MG capsule Take 1 capsule (100 mg total) by mouth 3 (three) times daily. 10/15/21  Myra Rude, MD  guaifenesin (HUMIBID E) 400 MG TABS tablet Take 1 tablet 3 times daily as needed for chest congestion and cough 11/05/21   Theadora Rama Scales, PA-C  hydrochlorothiazide (HYDRODIURIL) 25 MG tablet Take 1 tablet (25 mg total) by mouth daily. 07/21/20   Mancel Bale, MD  losartan (COZAAR) 100 MG tablet Take 100 mg by mouth daily.    [provider]  metFORMIN (GLUCOPHAGE-XR) 500 MG 24 hr tablet Take 500 mg by mouth daily. 07/13/21   [provider]  Multiple Vitamin (MULTIVITAMIN WITH MINERALS) TABS tablet Take 1 tablet by mouth daily.    [provider]  naproxen (NAPROSYN) 500 MG tablet Take 1 tablet (500 mg total) by mouth 2 (two) times daily with a meal. 01/04/22   Wallis Bamberg, PA-C  OZEMPIC, 0.25 OR 0.5 MG/DOSE, 2 MG/1.5ML SOPN SMARTSIG:0.25 Milligram(s) SUB-Q Once a Week 04/25/21   [provider]  promethazine-dextromethorphan (PROMETHAZINE-DM)  6.25-15 MG/5ML syrup Take 5 mLs by mouth 4 (four) times daily as needed for cough. 11/05/21   Theadora Rama Scales, PA-C  tiZANidine (ZANAFLEX) 4 MG tablet Take 1 tablet (4 mg total) by mouth at bedtime. 01/04/22   Wallis Bamberg, PA-C    Family History Family History  Problem Relation Age of Onset   Breast cancer Other    Heart disease Mother    Lupus Maternal Aunt    Stomach cancer Maternal Aunt    Colon cancer Neg Hx    Esophageal cancer Neg Hx    Rectal cancer Neg Hx     Social History Social History   Tobacco Use   Smoking status: Never   Smokeless tobacco: Never  Vaping Use   Vaping status: Never Used  Substance Use Topics   Alcohol use: Never   Drug use: Never     Allergies   Morphine and codeine   Review of Systems Review of Systems  Gastrointestinal:  Positive for abdominal pain.     Physical Exam Triage Vital Signs ED Triage Vitals  Encounter Vitals Group     BP 11/11/22 1744 (!) 171/96     Systolic BP Percentile --      Diastolic BP Percentile --      Pulse Rate 11/11/22 1744 79     Resp 11/11/22 1744 16     Temp 11/11/22 1744 98.3 F (36.8 C)     Temp Source 11/11/22 1744 Oral     SpO2 11/11/22 1744 99 %     Weight --      Height --      Head Circumference --      Peak Flow --      Pain Score 11/11/22 1748 8     Pain Loc --      Pain Education --      Exclude from Growth Chart --    No data found.  Updated Vital Signs BP (!) 171/96   Pulse 79   Temp 98.3 F (36.8 C) (Oral)   Resp 16   LMP 05/05/2019 (Approximate)   SpO2 99%   Visual Acuity Right Eye Distance:   Left Eye Distance:   Bilateral Distance:    Right Eye Near:   Left Eye Near:    Bilateral Near:     Physical Exam   UC Treatments / Results  Labs (all labs ordered are listed, but only abnormal results are displayed) Labs Reviewed  POCT URINALYSIS DIP (MANUAL ENTRY) - Abnormal; Notable for the following components:  Result Value   Ketones, POC UA trace (5)  (*)    Spec Grav, UA >=1.030 (*)    Protein Ur, POC =100 (*)    All other components within normal limits  CBC WITH DIFFERENTIAL/PLATELET  CERVICOVAGINAL ANCILLARY ONLY    EKG   Radiology DG Abd 2 Views  Result Date: 11/11/2022 CLINICAL DATA:  Abdominal pain. EXAM: ABDOMEN - 2 VIEW COMPARISON:  None Available. FINDINGS: Upright film shows no evidence for intraperitoneal free air. There is no evidence for gaseous bowel dilation to suggest obstruction. Surgical clips in the right upper quadrant suggest prior cholecystectomy. No unexpected abdominopelvic calcification. Visualized bony anatomy unremarkable. IMPRESSION: Nonobstructive bowel gas pattern. Electronically Signed   By: Kennith Center M.D.   On: 11/11/2022 19:28    Procedures Procedures (including critical care time)  Medications Ordered in UC Medications - No data to display  Initial Impression / Assessment and Plan / UC Course  I have reviewed the triage vital signs and the nursing notes.  Pertinent labs & imaging results that were available during my care of the patient were reviewed by me and considered in my medical decision making (see chart for details).    Suspect diverticulitis. Begin Augmentin for one week. Followup with Family Doctor in one week.  Final Clinical Impressions(s) / UC Diagnoses   Final diagnoses:  Generalized abdominal pain     Discharge Instructions      Begin clear liquids for about 24 hours, then may begin a BRAT diet (Bananas, Rice, Applesauce, Toast) for about 24 hours. Then gradually advance to a regular diet as tolerated.  Continue Miralax 1 capful mixed in about 8 ounces of fluid once daily.  If symptoms become significantly worse during the night or over the weekend, proceed to the local emergency room.      ED Prescriptions     Medication Sig Dispense Auth. Provider   amoxicillin-clavulanate (AUGMENTIN) 875-125 MG tablet Take one tab PO Q8hr. 21 tablet Lattie Haw, MD       PDMP not reviewed this encounter.

## 2022-11-11 NOTE — Discharge Instructions (Signed)
Begin clear liquids for about 24 hours, then may begin a [employer] (Bananas, Rice, Applesauce, Toast) for about 24 hours. Then gradually advance to a regular diet as tolerated.  Continue Miralax 1 capful mixed in about 8 ounces of fluid once daily.  If symptoms become significantly worse during the night or over the weekend, proceed to the local emergency room.

## 2022-11-14 NOTE — Progress Notes (Signed)
Orthotic order placed patient to be fit when in  [employer], CFo, CFm

## 2022-11-15 ENCOUNTER — Telehealth: Payer: Self-pay

## 2022-11-15 LAB — CBC WITH DIFFERENTIAL/PLATELET
Basophils Absolute: 0.1 10*3/uL (ref 0.0–0.2)
Basos: 1 %
EOS (ABSOLUTE): 0.2 10*3/uL (ref 0.0–0.4)
Eos: 2 %
Hematocrit: 44.3 % (ref 34.0–46.6)
Hemoglobin: 14.7 g/dL (ref 11.1–15.9)
Immature Grans (Abs): 0 10*3/uL (ref 0.0–0.1)
Immature Granulocytes: 0 %
Lymphocytes Absolute: 3.8 10*3/uL — ABNORMAL HIGH (ref 0.7–3.1)
Lymphs: 48 %
MCH: 30.5 pg (ref 26.6–33.0)
MCHC: 33.2 g/dL (ref 31.5–35.7)
MCV: 92 fL (ref 79–97)
Monocytes Absolute: 0.7 10*3/uL (ref 0.1–0.9)
Monocytes: 9 %
Neutrophils Absolute: 3.1 10*3/uL (ref 1.4–7.0)
Neutrophils: 40 %
Platelets: 315 10*3/uL (ref 150–450)
RBC: 4.82 x10E6/uL (ref 3.77–5.28)
RDW: 13.6 % (ref 11.7–15.4)
WBC: 7.8 10*3/uL (ref 3.4–10.8)

## 2022-11-15 LAB — SPECIMEN STATUS REPORT

## 2022-11-15 NOTE — Telephone Encounter (Signed)
Returned msg from Personnel officer. No answer and mailbox Is full.

## 2022-11-16 LAB — CERVICOVAGINAL ANCILLARY ONLY
Comment: NEGATIVE
Comment: NEGATIVE
Comment: NEGATIVE
Comment: NEGATIVE
Comment: NEGATIVE
Comment: NORMAL

## 2022-12-04 ENCOUNTER — Encounter (HOSPITAL_BASED_OUTPATIENT_CLINIC_OR_DEPARTMENT_OTHER): Payer: Self-pay | Admitting: Emergency Medicine

## 2022-12-04 ENCOUNTER — Emergency Department (HOSPITAL_BASED_OUTPATIENT_CLINIC_OR_DEPARTMENT_OTHER)
Admission: EM | Admit: 2022-12-04 | Discharge: 2022-12-04 | Disposition: A | Discharge status: Home / Self Care | Payer: Medicaid Other | Attending: Emergency Medicine | Admitting: Emergency Medicine

## 2022-12-04 ENCOUNTER — Other Ambulatory Visit: Payer: Self-pay

## 2022-12-04 DIAGNOSIS — J45909 Unspecified asthma, uncomplicated: Secondary | ICD-10-CM | POA: Insufficient documentation

## 2022-12-04 DIAGNOSIS — Z79899 Other long term (current) drug therapy: Secondary | ICD-10-CM | POA: Diagnosis not present

## 2022-12-04 DIAGNOSIS — R531 Weakness: Secondary | ICD-10-CM | POA: Insufficient documentation

## 2022-12-04 DIAGNOSIS — Z7984 Long term (current) use of oral hypoglycemic drugs: Secondary | ICD-10-CM | POA: Diagnosis not present

## 2022-12-04 DIAGNOSIS — E1165 Type 2 diabetes mellitus with hyperglycemia: Secondary | ICD-10-CM | POA: Insufficient documentation

## 2022-12-04 DIAGNOSIS — I10 Essential (primary) hypertension: Secondary | ICD-10-CM | POA: Diagnosis not present

## 2022-12-04 DIAGNOSIS — R29898 Other symptoms and signs involving the musculoskeletal system: Secondary | ICD-10-CM

## 2022-12-04 DIAGNOSIS — R200 Anesthesia of skin: Secondary | ICD-10-CM | POA: Insufficient documentation

## 2022-12-04 LAB — CBC WITH DIFFERENTIAL/PLATELET
Abs Immature Granulocytes: 0.02 10*3/uL (ref 0.00–0.07)
Basophils Absolute: 0.1 10*3/uL (ref 0.0–0.1)
Basophils Relative: 1 %
Eosinophils Absolute: 0.2 10*3/uL (ref 0.0–0.5)
Eosinophils Relative: 2 %
HCT: 46 % (ref 36.0–46.0)
Hemoglobin: 14.9 g/dL (ref 12.0–15.0)
Immature Granulocytes: 0 %
Lymphocytes Relative: 40 %
Lymphs Abs: 2.9 10*3/uL (ref 0.7–4.0)
MCH: 29.2 pg (ref 26.0–34.0)
MCHC: 32.4 g/dL (ref 30.0–36.0)
MCV: 90.2 fL (ref 80.0–100.0)
Monocytes Absolute: 0.5 10*3/uL (ref 0.1–1.0)
Monocytes Relative: 7 %
Neutro Abs: 3.7 10*3/uL (ref 1.7–7.7)
Neutrophils Relative %: 50 %
Platelets: 298 10*3/uL (ref 150–400)
RBC: 5.1 MIL/uL (ref 3.87–5.11)
RDW: 12.9 % (ref 11.5–15.5)
WBC: 7.3 10*3/uL (ref 4.0–10.5)
nRBC: 0 % (ref 0.0–0.2)

## 2022-12-04 LAB — COMPREHENSIVE METABOLIC PANEL
ALT: 24 U/L (ref 0–44)
AST: 14 U/L — ABNORMAL LOW (ref 15–41)
Albumin: 4.2 g/dL (ref 3.5–5.0)
Alkaline Phosphatase: 68 U/L (ref 38–126)
Anion gap: 6 (ref 5–15)
BUN: 12 mg/dL (ref 6–20)
CO2: 27 mmol/L (ref 22–32)
Calcium: 10.1 mg/dL (ref 8.9–10.3)
Chloride: 104 mmol/L (ref 98–111)
Creatinine, Ser: 0.7 mg/dL (ref 0.44–1.00)
GFR, Estimated: >60 mL/min (ref >60)
Glucose, Bld: 151 mg/dL — ABNORMAL HIGH (ref 70–99)
Potassium: 3.7 mmol/L (ref 3.5–5.1)
Sodium: 137 mmol/L (ref 135–145)
Total Bilirubin: 0.3 mg/dL (ref 0.3–1.2)
Total Protein: 7.2 g/dL (ref 6.5–8.1)

## 2022-12-04 NOTE — ED Provider Notes (Signed)
[place] EMERGENCY DEPARTMENT AT [facility] Provider Note   CSN: [id number] Arrival date & time: 12/04/22  1222     History  Chief Complaint  Patient presents with   Hand Pain    Elaine Perez is a 37 y.o. female with history of diabetes, asthma, hypertension, presents with concern for bilateral hand numbness ongoing for the past week and a half.  Denies any inciting injuries.  She reports the numbness is primarily in the fingertips of all digits bilaterally except for the pinky fingers.  She also reports more difficulty with tasks such as opening bottle caps and feels more weak in her hands.   She works at a [employer] and does a lot of repetitive motions with her wrist and lifting objects.    Hand Pain       Home Medications Prior to Admission medications   Medication Sig Start Date End Date Taking? Authorizing Provider  albuterol (VENTOLIN HFA) 108 (90 Base) MCG/ACT inhaler Inhale 1-2 puffs into the lungs every 6 (six) hours as needed for wheezing or shortness of breath. 08/13/22   Radford Pax, NP  amLODipine (NORVASC) 10 MG tablet Take 1 tablet (10 mg total) by mouth daily. 09/09/21   Wallis Bamberg, PA-C  amoxicillin-clavulanate (AUGMENTIN) [phone] MG tablet Take one tab PO Q8hr. 11/11/22   Lattie Haw, MD  benzonatate (TESSALON) 200 MG capsule Take 1 capsule (200 mg total) by mouth 3 (three) times daily as needed for cough. 08/13/22   Radford Pax, NP  carvedilol (COREG) 6.25 MG tablet Take 6.25 mg by mouth 2 (two) times daily. 05/16/20   [provider]  EPINEPHrine 0.3 mg/0.3 mL IJ SOAJ injection Inject 0.3 mg into the muscle as needed for anaphylaxis. 02/13/22   Carmel Sacramento A, PA-C  fluconazole (DIFLUCAN) 150 MG tablet Take 1 tablet (150 mg total) by mouth daily. 10/08/22   Radford Pax, NP  fluticasone (FLONASE) 50 MCG/ACT nasal spray Place 1 spray into both nostrils daily. Begin by using 2 sprays in each nare daily for 3 to 5 days, then  decrease to 1 spray in each nare daily. 11/05/21   Theadora Rama Scales, PA-C  furosemide (LASIX) 20 MG tablet Take 1 tablet (20 mg total) by mouth daily. 05/28/21   Geoffery Lyons, MD  gabapentin (NEURONTIN) 100 MG capsule Take 1 capsule (100 mg total) by mouth 3 (three) times daily. 10/15/21   Myra Rude, MD  guaifenesin (HUMIBID E) 400 MG TABS tablet Take 1 tablet 3 times daily as needed for chest congestion and cough 11/05/21   Theadora Rama Scales, PA-C  hydrochlorothiazide (HYDRODIURIL) 25 MG tablet Take 1 tablet (25 mg total) by mouth daily. 07/21/20   Mancel Bale, MD  losartan (COZAAR) 100 MG tablet Take 100 mg by mouth daily.    [provider]  metFORMIN (GLUCOPHAGE-XR) 500 MG 24 hr tablet Take 500 mg by mouth daily. 07/13/21   [provider]  Multiple Vitamin (MULTIVITAMIN WITH MINERALS) TABS tablet Take 1 tablet by mouth daily.    [provider]  naproxen (NAPROSYN) 500 MG tablet Take 1 tablet (500 mg total) by mouth 2 (two) times daily with a meal. 01/04/22   Wallis Bamberg, PA-C  OZEMPIC, 0.25 OR 0.5 MG/DOSE, 2 MG/1.5ML SOPN SMARTSIG:0.25 Milligram(s) SUB-Q Once a Week 04/25/21   [provider]  promethazine-dextromethorphan (PROMETHAZINE-DM) 6.25-15 MG/5ML syrup Take 5 mLs by mouth 4 (four) times daily as needed for cough. 11/05/21   Lequita Halt,  Lindsay Scales, PA-C  tiZANidine (ZANAFLEX) 4 MG tablet Take 1 tablet (4 mg total) by mouth at bedtime. 01/04/22   Wallis Bamberg, PA-C      Allergies    Morphine and codeine    Review of Systems   Review of Systems  Neurological:  Positive for weakness and numbness.    Physical Exam Updated Vital Signs BP (!) 169/85   Pulse 77   Temp 98.5 F (36.9 C) (Oral)   Resp 18   Ht 5\' 1"  (1.549 m)   Wt 99.8 kg   LMP 05/05/2019 (Approximate)   SpO2 99%   BMI 41.57 kg/m  Physical Exam Vitals and nursing note reviewed.  Constitutional:      General: She is not in acute distress.    Appearance: She is  well-developed.  HENT:     Head: Normocephalic and atraumatic.  Eyes:     Conjunctiva/sclera: Conjunctivae normal.  Cardiovascular:     Rate and Rhythm: Normal rate and regular rhythm.     Heart sounds: No murmur heard. Pulmonary:     Effort: Pulmonary effort is normal. No respiratory distress.     Breath sounds: Normal breath sounds.  Abdominal:     Palpations: Abdomen is soft.     Tenderness: There is no abdominal tenderness.  Musculoskeletal:        General: No swelling.     Cervical back: Neck supple.     Comments: No obvious deformity, erythema, edema of the upper extremities bilaterally  Skin:    General: Skin is warm and dry.     Capillary Refill: Capillary refill takes less than 2 seconds.  Neurological:     General: No focal deficit present.     Mental Status: She is alert.     Comments: Patient reports numbness to the palmar aspect of the right and left 1st through 4th digits, no numbness to the dorsal aspect of the hands bilaterally.  Patient with weakness with bilateral resisted finger abduction, weakness with bilateral resisted thumb and pointer finger opposition, left greater than right.  5/5 strength with resisted wrist flexion and extension.   Positive Tinel's sign  5/5 strength in the bilateral upper extremities with resisted elbow flexion and extension  Psychiatric:        Mood and Affect: Mood normal.     ED Results / Procedures / Treatments   Labs (all labs ordered are listed, but only abnormal results are displayed) Labs Reviewed  COMPREHENSIVE METABOLIC PANEL - Abnormal; Notable for the following components:      Result Value   Glucose, Bld 151 (*)    AST 14 (*)    All other components within normal limits  CBC WITH DIFFERENTIAL/PLATELET    EKG None  Radiology No results found.  Procedures Procedures    Medications Ordered in ED Medications - No data to display  ED Course/ Medical Decision Making/ A&P                                  Medical Decision Making Amount and/or Complexity of Data Reviewed Labs: ordered. Radiology: ordered.   37 y.o. female with pertinent past medical history of diabetes, hypertension presents to the ED for concern of bilateral hand numbness and weakness for the past week and a half  Differential diagnosis includes but is not limited to cervical radiculopathy, MS, diabetic neuropathy, carpal tunnel syndrome, Guillain-Barr, ACS  ED Course:  Patient overall well-appearing in no acute distress.  She has normal vital signs except for slightly elevated blood pressure 169/85.  She does have numbness in the bilateral hands and radial nerve distribution.  Weakness of the bilateral hands with resisted finger abduction, resisted thumb and pointer finger opposition.  5/5 strength with resisted wrist flexion and extension.  Weakness in the median and ulnar nerve distributions does not fit with the picture of carpal tunnel syndrome.  Given it is also bilateral, more concern for a cervical cause.  I reach out to the providers at [facility] and Dr. Lockie Mola with accepting patient.  Will plan on MRI cervical spine for further evaluation. No concern for ACS at this time, this has been ongoing for 1.5 weeks and no chest pain or shortness of breath. No concern for stroke, patient with no focal neuro deficits. Patient stable to go POV. She was instructed to go directly to [facility] ER for her MRI.   Impression: Bilateral hand numbness in radian nerve distribution Bilateral hand weakness with median and ulnar nerve testing  Disposition:  Patient transferred to [facility] [place]  for cervical MRI. Discusses with accepting provider Dr. Lockie Mola Return precautions given.  Lab Tests: I Ordered, and personally interpreted labs.  The pertinent results include:   CBC without leukocytosis CMP without any electrolyte abnormalities, glucose of 151           Final Clinical Impression(s) / ED Diagnoses Final  diagnoses:  Bilateral hand numbness  Weakness of both hands    Rx / DC Orders ED Discharge Orders     None         Arabella Merles, PA-C 12/04/22 1550    Alvira Monday, MD 12/05/22 2244

## 2022-12-04 NOTE — Discharge Instructions (Addendum)
Go directly to [facility] emergency room.  I have already placed the order for your MRI.

## 2022-12-04 NOTE — ED Notes (Signed)
Pt eloped, PA notified and aware.

## 2022-12-04 NOTE — ED Triage Notes (Signed)
Pt caox4, ambulatory, NAD c/o pain and numbness in hands bilateral for approx, 1.5 weeks. Pt reports this has happened multiple times in the past but has not been evaluated for it. Pain is worse in the mornings. Pt denies any recent trauma or injury.

## 2022-12-04 NOTE — ED Notes (Signed)
The CNA was walking by pt bed and pt asked how long MRI would take. When informed that we did not know how long. Pt stated she was going to leave.

## 2022-12-05 ENCOUNTER — Emergency Department (HOSPITAL_BASED_OUTPATIENT_CLINIC_OR_DEPARTMENT_OTHER)
Admission: EM | Admit: 2022-12-05 | Discharge: 2022-12-05 | Disposition: A | Discharge status: Home / Self Care | Payer: Medicaid Other | Attending: Emergency Medicine | Admitting: Emergency Medicine

## 2022-12-05 ENCOUNTER — Other Ambulatory Visit: Payer: Self-pay

## 2022-12-05 ENCOUNTER — Emergency Department (HOSPITAL_COMMUNITY): Payer: Medicaid Other

## 2022-12-05 ENCOUNTER — Encounter (HOSPITAL_BASED_OUTPATIENT_CLINIC_OR_DEPARTMENT_OTHER): Payer: Self-pay | Admitting: Emergency Medicine

## 2022-12-05 DIAGNOSIS — Z79899 Other long term (current) drug therapy: Secondary | ICD-10-CM | POA: Insufficient documentation

## 2022-12-05 DIAGNOSIS — E119 Type 2 diabetes mellitus without complications: Secondary | ICD-10-CM | POA: Diagnosis not present

## 2022-12-05 DIAGNOSIS — R202 Paresthesia of skin: Secondary | ICD-10-CM

## 2022-12-05 DIAGNOSIS — M5412 Radiculopathy, cervical region: Secondary | ICD-10-CM | POA: Insufficient documentation

## 2022-12-05 DIAGNOSIS — G959 Disease of spinal cord, unspecified: Secondary | ICD-10-CM | POA: Insufficient documentation

## 2022-12-05 DIAGNOSIS — R200 Anesthesia of skin: Secondary | ICD-10-CM | POA: Diagnosis present

## 2022-12-05 DIAGNOSIS — I10 Essential (primary) hypertension: Secondary | ICD-10-CM | POA: Insufficient documentation

## 2022-12-05 LAB — BASIC METABOLIC PANEL
Anion gap: 9 (ref 5–15)
BUN: 13 mg/dL (ref 6–20)
CO2: 26 mmol/L (ref 22–32)
Calcium: 8.9 mg/dL (ref 8.9–10.3)
Chloride: 101 mmol/L (ref 98–111)
Creatinine, Ser: 0.63 mg/dL (ref 0.44–1.00)
GFR, Estimated: >60 mL/min (ref >60)
Glucose, Bld: 156 mg/dL — ABNORMAL HIGH (ref 70–99)
Potassium: 3.8 mmol/L (ref 3.5–5.1)
Sodium: 136 mmol/L (ref 135–145)

## 2022-12-05 LAB — CBC WITH DIFFERENTIAL/PLATELET
Abs Immature Granulocytes: 0.03 10*3/uL (ref 0.00–0.07)
Basophils Absolute: 0.1 10*3/uL (ref 0.0–0.1)
Basophils Relative: 1 %
Eosinophils Absolute: 0.2 10*3/uL (ref 0.0–0.5)
Eosinophils Relative: 3 %
HCT: 42.7 % (ref 36.0–46.0)
Hemoglobin: 13.8 g/dL (ref 12.0–15.0)
Immature Granulocytes: 0 %
Lymphocytes Relative: 41 %
Lymphs Abs: 2.9 10*3/uL (ref 0.7–4.0)
MCH: 29.6 pg (ref 26.0–34.0)
MCHC: 32.3 g/dL (ref 30.0–36.0)
MCV: 91.6 fL (ref 80.0–100.0)
Monocytes Absolute: 0.7 10*3/uL (ref 0.1–1.0)
Monocytes Relative: 10 %
Neutro Abs: 3.1 10*3/uL (ref 1.7–7.7)
Neutrophils Relative %: 45 %
Platelets: 278 10*3/uL (ref 150–400)
RBC: 4.66 MIL/uL (ref 3.87–5.11)
RDW: 12.9 % (ref 11.5–15.5)
WBC: 6.9 10*3/uL (ref 4.0–10.5)
nRBC: 0 % (ref 0.0–0.2)

## 2022-12-05 LAB — SEDIMENTATION RATE: Sed Rate: 12 mm/h (ref 0–22)

## 2022-12-05 LAB — C-REACTIVE PROTEIN: CRP: <0.5 mg/dL (ref <1.0)

## 2022-12-05 MED ORDER — PREDNISONE 10 MG (21) PO TBPK: ORAL_TABLET | Freq: Every day | ORAL | 0 refills | Status: DC

## 2022-12-05 MED ORDER — LIDOCAINE 5 % EX PTCH: 1.0000 | MEDICATED_PATCH | CUTANEOUS | Status: DC

## 2022-12-05 MED ORDER — GADOBUTROL 1 MMOL/ML IV SOLN
10.0000 mL | Freq: Once | INTRAVENOUS | Status: AC | PRN
  Administered 2022-12-05: 10 mL via INTRAVENOUS

## 2022-12-05 MED ORDER — IBUPROFEN 800 MG PO TABS
800.0000 mg | ORAL_TABLET | Freq: Once | ORAL | Status: AC
  Administered 2022-12-05: 800 mg via ORAL
  Filled 2022-12-05: qty 1

## 2022-12-05 MED ORDER — LORAZEPAM 1 MG PO TABS
0.5000 mg | ORAL_TABLET | ORAL | Status: AC | PRN
  Administered 2022-12-05: 0.5 mg via ORAL
  Filled 2022-12-05: qty 1

## 2022-12-05 NOTE — ED Provider Notes (Signed)
I assumed care of this patient from previous provider.  Please see their note for further details of history, exam, and MDM.   Briefly patient is a 37 y.o. female who presented for BUE pain and weakness requiring MRI.  No acute distress.  Patient care turned over to oncoming provider. Patient case and results discussed in detail; please see their note for further ED managment.          Nira Conn, MD 12/05/22 [phone]
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
## 2022-12-05 NOTE — ED Notes (Signed)
Patient transported to MRI 

## 2022-12-05 NOTE — Discharge Instructions (Addendum)
Wear wrist splints at night.  Wear during the day as needed for activities that worsen your symptoms.  A prescription for a steroid taper was sent to your pharmacy.  Take as prescribed.  Take over-the-counter ibuprofen and Tylenol as needed for pain.  Follow-up with your primary care doctor for reassessment in 1 to 2 weeks.

## 2022-12-05 NOTE — ED Triage Notes (Signed)
Pt reports upper middle back pain that started last night and BIL hand "numbness" for 1.5 weeks.

## 2022-12-05 NOTE — ED Provider Notes (Signed)
Care of patient assumed from Dr. Eudelia Bunch.  Patient presenting for upper extremity pain or weakness.  Awaiting results of cervical spine MRI.  Discharge is anticipated. Physical Exam  BP (!) 157/74   Pulse 62   Temp 97.8 F (36.6 C) (Oral)   Resp 20   Ht 5\' 1"  (1.549 m)   Wt 101.2 kg   LMP 05/05/2019 (Approximate)   SpO2 100%   BMI 42.14 kg/m   Physical Exam Vitals and nursing note reviewed.  Constitutional:      General: She is not in acute distress.    Appearance: Normal appearance. She is well-developed. She is not ill-appearing, toxic-appearing or diaphoretic.  HENT:     Head: Normocephalic and atraumatic.     Right Ear: External ear normal.     Left Ear: External ear normal.     Nose: Nose normal.     Mouth/Throat:     Mouth: Mucous membranes are moist.  Eyes:     Extraocular Movements: Extraocular movements intact.     Conjunctiva/sclera: Conjunctivae normal.  Cardiovascular:     Rate and Rhythm: Normal rate and regular rhythm.  Pulmonary:     Effort: Pulmonary effort is normal. No respiratory distress.  Abdominal:     General: There is no distension.     Tenderness: There is no abdominal tenderness.  Musculoskeletal:        General: No swelling. Normal range of motion.     Cervical back: Normal range of motion and neck supple.  Skin:    General: Skin is warm and dry.     Coloration: Skin is not jaundiced or pale.  Neurological:     General: No focal deficit present.     Mental Status: She is alert and oriented to person, place, and time.  Psychiatric:        Mood and Affect: Mood normal.        Behavior: Behavior normal.     Procedures  Procedures  ED Course / MDM    Medical Decision Making Amount and/or Complexity of Data Reviewed Labs: ordered. Radiology: ordered.  Risk Prescription drug management.   MRI shows no abnormal cord signal.  There was an enlarged sella but patient symptoms not consistent with IIH.  On assessment, she is resting  comfortably.  She describes the distribution of her symptoms as wrist and distal on the right side and fingers only on the left side.  She does have limited symptoms on digits 4 and 5.  Patient has positive Phalen sign.  I suspect carpal tunnel syndrome.  Patient was provided wrist splints and prescription for steroid taper.  She was discharged in stable condition.       Gloris Manchester, MD 12/05/22 [phone]

## 2022-12-05 NOTE — ED Notes (Signed)
Pt off unit at this time POV to [facility], pt instructed to report to ED immediately upon leaving, pt verbalized understanding

## 2022-12-05 NOTE — Progress Notes (Signed)
Orthopedic Tech Progress Note Patient Details:  Roseland Pullara [birth year] [id number]  Ortho Devices Type of Ortho Device: Wrist splint Ortho Device/Splint Location: BUE Ortho Device/Splint Interventions: Ordered, Application, Adjustment   Post Interventions Patient Tolerated: Well Instructions Provided: Adjustment of device, Care of device  Ziah Leandro OTR/L 12/05/2022, 8:18 AM

## 2022-12-27 ENCOUNTER — Emergency Department (HOSPITAL_BASED_OUTPATIENT_CLINIC_OR_DEPARTMENT_OTHER)
Admission: EM | Admit: 2022-12-27 | Discharge: 2022-12-27 | Disposition: A | Discharge status: Home / Self Care | Payer: Medicaid Other | Attending: Emergency Medicine | Admitting: Emergency Medicine

## 2022-12-27 ENCOUNTER — Other Ambulatory Visit: Payer: Self-pay

## 2022-12-27 DIAGNOSIS — Z7984 Long term (current) use of oral hypoglycemic drugs: Secondary | ICD-10-CM | POA: Insufficient documentation

## 2022-12-27 DIAGNOSIS — Z79899 Other long term (current) drug therapy: Secondary | ICD-10-CM | POA: Diagnosis not present

## 2022-12-27 DIAGNOSIS — N898 Other specified noninflammatory disorders of vagina: Secondary | ICD-10-CM | POA: Insufficient documentation

## 2022-12-27 LAB — URINALYSIS, ROUTINE W REFLEX MICROSCOPIC
Bilirubin Urine: NEGATIVE
Glucose, UA: NEGATIVE mg/dL
Hgb urine dipstick: NEGATIVE
Ketones, ur: NEGATIVE mg/dL
Leukocytes,Ua: NEGATIVE
Nitrite: NEGATIVE
Specific Gravity, Urine: 1.026 (ref 1.005–1.030)
pH: 6.5 (ref 5.0–8.0)

## 2022-12-27 LAB — WET PREP, GENITAL
Clue Cells Wet Prep HPF POC: NONE SEEN
Sperm: NONE SEEN
Trich, Wet Prep: NONE SEEN
WBC, Wet Prep HPF POC: >=10 — AB (ref <10)
Yeast Wet Prep HPF POC: NONE SEEN

## 2022-12-27 NOTE — ED Provider Notes (Signed)
[place] EMERGENCY DEPARTMENT AT [facility] Provider Note   CSN: [id number] Arrival date & time: 12/27/22  1702     History  Chief Complaint  Patient presents with   Vaginal Discharge    Elaine Perez is a 37 y.o. female.  The history is provided by the patient. No language interpreter was used.  Vaginal Discharge Severity:  Moderate Onset quality:  Sudden Timing:  Constant Progression:  Worsening Chronicity:  New Relieved by:  Nothing Worsened by:  Nothing Ineffective treatments:  None tried Risk factors: no STI        Home Medications Prior to Admission medications   Medication Sig Start Date End Date Taking? Authorizing Provider  albuterol (VENTOLIN HFA) 108 (90 Base) MCG/ACT inhaler Inhale 1-2 puffs into the lungs every 6 (six) hours as needed for wheezing or shortness of breath. 08/13/22   Radford Pax, NP  amLODipine (NORVASC) 10 MG tablet Take 1 tablet (10 mg total) by mouth daily. 09/09/21   Wallis Bamberg, PA-C  amoxicillin-clavulanate (AUGMENTIN) [phone] MG tablet Take one tab PO Q8hr. 11/11/22   Lattie Haw, MD  benzonatate (TESSALON) 200 MG capsule Take 1 capsule (200 mg total) by mouth 3 (three) times daily as needed for cough. 08/13/22   Radford Pax, NP  carvedilol (COREG) 6.25 MG tablet Take 6.25 mg by mouth 2 (two) times daily. 05/16/20   [provider]  EPINEPHrine 0.3 mg/0.3 mL IJ SOAJ injection Inject 0.3 mg into the muscle as needed for anaphylaxis. 02/13/22   Carmel Sacramento A, PA-C  fluconazole (DIFLUCAN) 150 MG tablet Take 1 tablet (150 mg total) by mouth daily. 10/08/22   Radford Pax, NP  fluticasone (FLONASE) 50 MCG/ACT nasal spray Place 1 spray into both nostrils daily. Begin by using 2 sprays in each nare daily for 3 to 5 days, then decrease to 1 spray in each nare daily. 11/05/21   Theadora Rama Scales, PA-C  furosemide (LASIX) 20 MG tablet Take 1 tablet (20 mg total) by mouth daily. 05/28/21   Geoffery Lyons, MD   gabapentin (NEURONTIN) 100 MG capsule Take 1 capsule (100 mg total) by mouth 3 (three) times daily. 10/15/21   Myra Rude, MD  guaifenesin (HUMIBID E) 400 MG TABS tablet Take 1 tablet 3 times daily as needed for chest congestion and cough 11/05/21   Theadora Rama Scales, PA-C  hydrochlorothiazide (HYDRODIURIL) 25 MG tablet Take 1 tablet (25 mg total) by mouth daily. 07/21/20   Mancel Bale, MD  losartan (COZAAR) 100 MG tablet Take 100 mg by mouth daily.    [provider]  metFORMIN (GLUCOPHAGE-XR) 500 MG 24 hr tablet Take 500 mg by mouth daily. 07/13/21   [provider]  Multiple Vitamin (MULTIVITAMIN WITH MINERALS) TABS tablet Take 1 tablet by mouth daily.    [provider]  naproxen (NAPROSYN) 500 MG tablet Take 1 tablet (500 mg total) by mouth 2 (two) times daily with a meal. 01/04/22   Wallis Bamberg, PA-C  OZEMPIC, 0.25 OR 0.5 MG/DOSE, 2 MG/1.5ML SOPN SMARTSIG:0.25 Milligram(s) SUB-Q Once a Week 04/25/21   [provider]  predniSONE (STERAPRED UNI-PAK 21 TAB) 10 MG (21) TBPK tablet Take by mouth daily. Take 6 tabs by mouth daily  for 2 days, then 5 tabs for 2 days, then 4 tabs for 2 days, then 3 tabs for 2 days, 2 tabs for 2 days, then 1 tab by mouth daily for 2 days 12/05/22   Gloris Manchester, MD  promethazine-dextromethorphan (  PROMETHAZINE-DM) 6.25-15 MG/5ML syrup Take 5 mLs by mouth 4 (four) times daily as needed for cough. 11/05/21   Theadora Rama Scales, PA-C  tiZANidine (ZANAFLEX) 4 MG tablet Take 1 tablet (4 mg total) by mouth at bedtime. 01/04/22   Wallis Bamberg, PA-C      Allergies    Morphine and codeine    Review of Systems   Review of Systems  Genitourinary:  Positive for vaginal discharge.  All other systems reviewed and are negative.   Physical Exam Updated Vital Signs BP (!) 166/97 (BP Location: Right Arm)   Pulse 81   Temp 97.8 F (36.6 C)   Resp 17   Ht 5\' 1"  (1.549 m)   Wt 101.2 kg   LMP 05/05/2019 (Approximate)   SpO2 95%    BMI 42.14 kg/m  Physical Exam Vitals and nursing note reviewed.  Constitutional:      Appearance: She is well-developed.  HENT:     Head: Normocephalic.  Cardiovascular:     Rate and Rhythm: Normal rate.  Pulmonary:     Effort: Pulmonary effort is normal.  Abdominal:     General: There is no distension.  Musculoskeletal:        General: Normal range of motion.  Skin:    General: Skin is warm.  Neurological:     General: No focal deficit present.     Mental Status: She is alert and oriented to person, place, and time.  Psychiatric:        Mood and Affect: Mood normal.     ED Results / Procedures / Treatments   Labs (all labs ordered are listed, but only abnormal results are displayed) Labs Reviewed  WET PREP, GENITAL - Abnormal; Notable for the following components:      Result Value   WBC, Wet Prep HPF POC >=10 (*)    All other components within normal limits  URINALYSIS, ROUTINE W REFLEX MICROSCOPIC - Abnormal; Notable for the following components:   Protein, ur TRACE (*)    All other components within normal limits  GC/CHLAMYDIA PROBE AMP ([place]) NOT AT [facility]    EKG None  Radiology No results found.  Procedures Procedures    Medications Ordered in ED Medications - No data to display  ED Course/ Medical Decision Making/ A&P                                 Medical Decision Making Amount and/or Complexity of Data Reviewed Labs: ordered.    Details: Wet prep no acute findings            Final Clinical Impression(s) / ED Diagnoses Final diagnoses:  Vaginal discharge    Rx / DC Orders ED Discharge Orders     None      An After Visit Summary was printed and given to the patient.    Osie Cheeks 12/30/22 2203    Benjiman Core, MD 12/31/22 [phone]

## 2022-12-27 NOTE — ED Notes (Signed)
In to chaperone for pelvic exam. Swabs and U/A taken to lab

## 2022-12-27 NOTE — ED Triage Notes (Signed)
Pt POV reporting white vaginal discharge that began today. Denies odor or itching.

## 2022-12-27 NOTE — Discharge Instructions (Signed)
You have test pending.  Return if any problems.  

## 2022-12-28 LAB — GC/CHLAMYDIA PROBE AMP (~~LOC~~) NOT AT ARMC
Chlamydia: NEGATIVE
Comment: NEGATIVE
Comment: NORMAL
Neisseria Gonorrhea: NEGATIVE

## 2023-02-14 ENCOUNTER — Ambulatory Visit
Admission: EM | Admit: 2023-02-14 | Discharge: 2023-02-14 | Disposition: A | Discharge status: Home / Self Care | Payer: Medicaid Other | Attending: Family Medicine | Admitting: Family Medicine

## 2023-02-14 DIAGNOSIS — M5412 Radiculopathy, cervical region: Secondary | ICD-10-CM | POA: Diagnosis not present

## 2023-02-14 DIAGNOSIS — M25512 Pain in left shoulder: Secondary | ICD-10-CM

## 2023-02-14 DIAGNOSIS — M5030 Other cervical disc degeneration, unspecified cervical region: Secondary | ICD-10-CM | POA: Diagnosis not present

## 2023-02-14 MED ORDER — PREDNISONE 20 MG PO TABS: ORAL_TABLET | ORAL | 0 refills | Status: DC

## 2023-02-14 MED ORDER — CYCLOBENZAPRINE HCL 5 MG PO TABS: 5.0000 mg | ORAL_TABLET | Freq: Every evening | ORAL | 0 refills | Status: DC | PRN

## 2023-02-14 NOTE — ED Triage Notes (Signed)
 Pt c/o pain to LUE/worse with movement x 2 days-states pain since work injury x "couple years"-NAD-steady gait

## 2023-02-14 NOTE — ED Provider Notes (Signed)
 [facility] - URGENT CARE CENTER  Note:  This document was prepared using Conservation officer, historic buildings and may include unintentional dictation errors.  MRN: [id number] DOB: 18-Jan-1986  Subjective:   Elaine Perez is a 37 y.o. female presenting for 2-day history of acute on chronic left shoulder pain, left upper back pain, left trapezius pain.  Patient has been doing a lot of work using her arms.  Does a lot of pulling sensations, fast-paced work.  Does not do heavy lifting.  Patient has a remote history of a left upper back/shoulder injury.  She has gotten workup for this.  She is currently being managed with physical therapy.  Has had an MRI of the neck that showed mild degenerative disc disease of the cervical region, mild neural foraminal narrowing at C3-C4 and C4-C5.  No current facility-administered medications for this encounter.  Current Outpatient Medications:    albuterol  (VENTOLIN  HFA) 108 (90 Base) MCG/ACT inhaler, Inhale 1-2 puffs into the lungs every 6 (six) hours as needed for wheezing or shortness of breath., Disp: 1 each, Rfl: 0   amLODipine  (NORVASC ) 10 MG tablet, Take 1 tablet (10 mg total) by mouth daily., Disp: 90 tablet, Rfl: 0   amoxicillin -clavulanate (AUGMENTIN ) 875-125 MG tablet, Take one tab PO Q8hr., Disp: 21 tablet, Rfl: 0   benzonatate  (TESSALON ) 200 MG capsule, Take 1 capsule (200 mg total) by mouth 3 (three) times daily as needed for cough., Disp: 20 capsule, Rfl: 0   carvedilol (COREG) 6.25 MG tablet, Take 6.25 mg by mouth 2 (two) times daily., Disp: , Rfl:    EPINEPHrine  0.3 mg/0.3 mL IJ SOAJ injection, Inject 0.3 mg into the muscle as needed for anaphylaxis., Disp: 2 each, Rfl: 0   fluconazole  (DIFLUCAN ) 150 MG tablet, Take 1 tablet (150 mg total) by mouth daily., Disp: 1 tablet, Rfl: 0   fluticasone  (FLONASE ) 50 MCG/ACT nasal spray, Place 1 spray into both nostrils daily. Begin by using 2 sprays in each nare daily for 3 to 5 days, then decrease to 1  spray in each nare daily., Disp: 15.8 mL, Rfl: 2   furosemide  (LASIX ) 20 MG tablet, Take 1 tablet (20 mg total) by mouth daily., Disp: 30 tablet, Rfl: 0   gabapentin  (NEURONTIN ) 100 MG capsule, Take 1 capsule (100 mg total) by mouth 3 (three) times daily., Disp: 60 capsule, Rfl: 1   guaifenesin  (HUMIBID E) 400 MG TABS tablet, Take 1 tablet 3 times daily as needed for chest congestion and cough, Disp: 21 tablet, Rfl: 0   hydrochlorothiazide  (HYDRODIURIL ) 25 MG tablet, Take 1 tablet (25 mg total) by mouth daily., Disp: 30 tablet, Rfl: 1   losartan (COZAAR) 100 MG tablet, Take 100 mg by mouth daily., Disp: , Rfl:    metFORMIN (GLUCOPHAGE-XR) 500 MG 24 hr tablet, Take 500 mg by mouth daily., Disp: , Rfl:    Multiple Vitamin (MULTIVITAMIN WITH MINERALS) TABS tablet, Take 1 tablet by mouth daily., Disp: , Rfl:    naproxen  (NAPROSYN ) 500 MG tablet, Take 1 tablet (500 mg total) by mouth 2 (two) times daily with a meal., Disp: 30 tablet, Rfl: 0   OZEMPIC, 0.25 OR 0.5 MG/DOSE, 2 MG/1.5ML SOPN, SMARTSIG:0.25 Milligram(s) SUB-Q Once a Week, Disp: , Rfl:    predniSONE  (STERAPRED UNI-PAK 21 TAB) 10 MG (21) TBPK tablet, Take by mouth daily. Take 6 tabs by mouth daily  for 2 days, then 5 tabs for 2 days, then 4 tabs for 2 days, then 3 tabs for 2 days, 2  tabs for 2 days, then 1 tab by mouth daily for 2 days, Disp: 42 tablet, Rfl: 0   promethazine -dextromethorphan (PROMETHAZINE -DM) 6.25-15 MG/5ML syrup, Take 5 mLs by mouth 4 (four) times daily as needed for cough., Disp: 118 mL, Rfl: 0   tiZANidine  (ZANAFLEX ) 4 MG tablet, Take 1 tablet (4 mg total) by mouth at bedtime., Disp: 30 tablet, Rfl: 0   Allergies  Allergen Reactions   Morphine And Codeine Itching and Rash    swelling swelling swelling swelling     Past Medical History:  Diagnosis Date   Asthma    Blood transfusion without reported diagnosis 2019   Diabetes mellitus without complication (HCC)    Fibroids    Hypertension    Iron deficiency anemia  04/27/2018   Sleep apnea    Does not use CPAP     Past Surgical History:  Procedure Laterality Date   ABDOMINAL HYSTERECTOMY     CHOLECYSTECTOMY     GALLBLADDER SURGERY     REPEAT CESAREAN SECTION      Family History  Problem Relation Age of Onset   Breast cancer Other    Heart disease Mother    Lupus Maternal Aunt    Stomach cancer Maternal Aunt    Colon cancer Neg Hx    Esophageal cancer Neg Hx    Rectal cancer Neg Hx     Social History   Tobacco Use   Smoking status: Never   Smokeless tobacco: Never  Vaping Use   Vaping status: Never Used  Substance Use Topics   Alcohol use: Never   Drug use: Never    ROS   Objective:   Vitals: BP (!) 173/118 (BP Location: Right Arm)   Pulse 72   Temp 98.2 F (36.8 C) (Oral)   Resp 15   LMP 05/05/2019 (Approximate)   SpO2 94%   Physical Exam Constitutional:      General: She is not in acute distress.    Appearance: Normal appearance. She is well-developed. She is not ill-appearing, toxic-appearing or diaphoretic.  HENT:     Head: Normocephalic and atraumatic.     Right Ear: External ear normal.     Left Ear: External ear normal.     Nose: Nose normal.     Mouth/Throat:     Mouth: Mucous membranes are moist.  Eyes:     General: No scleral icterus.       Right eye: No discharge.        Left eye: No discharge.     Extraocular Movements: Extraocular movements intact.  Cardiovascular:     Rate and Rhythm: Normal rate.  Pulmonary:     Effort: Pulmonary effort is normal.  Musculoskeletal:     Cervical back: Neck supple. Spasms and tenderness (over areas outlined) present. No swelling, edema, deformity, erythema, signs of trauma, lacerations, rigidity, torticollis, bony tenderness or crepitus. Pain with movement present. No spinous process tenderness or muscular tenderness. Decreased range of motion.       Back:  Skin:    General: Skin is warm and dry.  Neurological:     General: No focal deficit present.      Mental Status: She is alert and oriented to person, place, and time.     Cranial Nerves: No cranial nerve deficit.     Motor: No weakness.     Coordination: Coordination normal.     Gait: Gait normal.     Deep Tendon Reflexes: Reflexes normal.  Psychiatric:  Mood and Affect: Mood normal.        Behavior: Behavior normal.     Assessment and Plan :   PDMP not reviewed this encounter.  1. Cervical radiculopathy   2. Acute pain of left shoulder   3. Degenerative disc disease, cervical    Advised patient she stop using the shoulder sling.  Recommended a oral prednisone  course given distribution of her pain and history of degenerative disc disease of the cervical region with associated neural foraminal narrowing.  Recommend close follow-up with a spine specialist and orthopedist. Counseled patient on potential for adverse effects with medications prescribed/recommended today, ER and return-to-clinic precautions discussed, patient verbalized understanding.    Christopher Savannah, [place] 02/14/23 1756

## 2023-04-14 ENCOUNTER — Emergency Department (HOSPITAL_BASED_OUTPATIENT_CLINIC_OR_DEPARTMENT_OTHER)
Admission: EM | Admit: 2023-04-14 | Discharge: 2023-04-15 | Disposition: A | Discharge status: Home / Self Care | Payer: Medicaid Other | Attending: Emergency Medicine | Admitting: Emergency Medicine

## 2023-04-14 ENCOUNTER — Encounter (HOSPITAL_BASED_OUTPATIENT_CLINIC_OR_DEPARTMENT_OTHER): Payer: Self-pay | Admitting: Urology

## 2023-04-14 ENCOUNTER — Other Ambulatory Visit: Payer: Self-pay

## 2023-04-14 DIAGNOSIS — I10 Essential (primary) hypertension: Secondary | ICD-10-CM

## 2023-04-14 DIAGNOSIS — J45909 Unspecified asthma, uncomplicated: Secondary | ICD-10-CM | POA: Diagnosis not present

## 2023-04-14 DIAGNOSIS — Z7984 Long term (current) use of oral hypoglycemic drugs: Secondary | ICD-10-CM | POA: Insufficient documentation

## 2023-04-14 DIAGNOSIS — E236 Other disorders of pituitary gland: Secondary | ICD-10-CM | POA: Diagnosis not present

## 2023-04-14 DIAGNOSIS — R739 Hyperglycemia, unspecified: Secondary | ICD-10-CM

## 2023-04-14 DIAGNOSIS — R519 Headache, unspecified: Secondary | ICD-10-CM | POA: Diagnosis present

## 2023-04-14 DIAGNOSIS — Z79899 Other long term (current) drug therapy: Secondary | ICD-10-CM | POA: Diagnosis not present

## 2023-04-14 DIAGNOSIS — E119 Type 2 diabetes mellitus without complications: Secondary | ICD-10-CM | POA: Insufficient documentation

## 2023-04-14 LAB — CBC WITH DIFFERENTIAL/PLATELET
Abs Immature Granulocytes: 0.04 10*3/uL (ref 0.00–0.07)
Basophils Absolute: 0.1 10*3/uL (ref 0.0–0.1)
Basophils Relative: 1 %
Eosinophils Absolute: 0.2 10*3/uL (ref 0.0–0.5)
Eosinophils Relative: 3 %
HCT: 42.8 % (ref 36.0–46.0)
Hemoglobin: 14.1 g/dL (ref 12.0–15.0)
Immature Granulocytes: 1 %
Lymphocytes Relative: 43 %
Lymphs Abs: 3.5 10*3/uL (ref 0.7–4.0)
MCH: 29.1 pg (ref 26.0–34.0)
MCHC: 32.9 g/dL (ref 30.0–36.0)
MCV: 88.4 fL (ref 80.0–100.0)
Monocytes Absolute: 0.6 10*3/uL (ref 0.1–1.0)
Monocytes Relative: 7 %
Neutro Abs: 3.7 10*3/uL (ref 1.7–7.7)
Neutrophils Relative %: 45 %
Platelets: 269 10*3/uL (ref 150–400)
RBC: 4.84 MIL/uL (ref 3.87–5.11)
RDW: 12.5 % (ref 11.5–15.5)
WBC: 8.1 10*3/uL (ref 4.0–10.5)
nRBC: 0 % (ref 0.0–0.2)

## 2023-04-14 LAB — BASIC METABOLIC PANEL
Anion gap: 9 (ref 5–15)
BUN: 14 mg/dL (ref 6–20)
CO2: 25 mmol/L (ref 22–32)
Calcium: 9 mg/dL (ref 8.9–10.3)
Chloride: 102 mmol/L (ref 98–111)
Creatinine, Ser: 0.76 mg/dL (ref 0.44–1.00)
GFR, Estimated: >60 mL/min (ref >60)
Glucose, Bld: 284 mg/dL — ABNORMAL HIGH (ref 70–99)
Potassium: 3.8 mmol/L (ref 3.5–5.1)
Sodium: 136 mmol/L (ref 135–145)

## 2023-04-14 LAB — RESP PANEL BY RT-PCR (RSV, FLU A&B, COVID)  RVPGX2
Influenza A by PCR: NEGATIVE
Influenza B by PCR: NEGATIVE
Resp Syncytial Virus by PCR: NEGATIVE
SARS Coronavirus 2 by RT PCR: NEGATIVE

## 2023-04-14 MED ORDER — DIAZEPAM 5 MG/ML IJ SOLN
2.5000 mg | Freq: Once | INTRAMUSCULAR | Status: AC
  Administered 2023-04-14: 2.5 mg via INTRAVENOUS
  Filled 2023-04-14: qty 2

## 2023-04-14 MED ORDER — DROPERIDOL 2.5 MG/ML IJ SOLN
1.2500 mg | Freq: Once | INTRAMUSCULAR | Status: AC
  Administered 2023-04-14: 1.25 mg via INTRAVENOUS
  Filled 2023-04-14: qty 2

## 2023-04-14 MED ORDER — SODIUM CHLORIDE 0.9 % IV BOLUS
1000.0000 mL | Freq: Once | INTRAVENOUS | Status: AC
Start: 2023-04-14 — End: 2023-04-14
  Administered 2023-04-14: 1000 mL via INTRAVENOUS

## 2023-04-14 NOTE — ED Triage Notes (Signed)
 Pt states bad headache and dizziness that started yesterday  Pt also reports HTN at home, does not remember numbers  Took bp meds today  Sensitive to light and sound

## 2023-04-14 NOTE — ED Provider Notes (Cosign Needed Addendum)
 [place] EMERGENCY DEPARTMENT AT [facility] [place] Provider Note   CSN: [id number] Arrival date & time: 04/14/23  7829     History  Chief Complaint  Patient presents with   Headache    Elaine Perez is a 38 y.o. female.  Who presents emergency department with severe headache and hypertension.  She has a past medical history of diabetes, obesity, asthma.  Saw her primary care physician for bad headache and hypertension yesterday and had her hydrochlorothiazide dose increased.  This did not seem to help her blood pressure.  She complains of global throbbing headache with light and sound sensitivity, nausea, dizziness, pulsatile feeling in her head and she has had occasional vomiting.  She has a history of migraine headaches.  She did not get any treatment for her migraine yesterday at her primary care physician's office.   Headache      Home Medications Prior to Admission medications   Medication Sig Start Date End Date Taking? Authorizing Provider  albuterol (VENTOLIN HFA) 108 (90 Base) MCG/ACT inhaler Inhale 1-2 puffs into the lungs every 6 (six) hours as needed for wheezing or shortness of breath. 08/13/22   Radford Pax, NP  amLODipine (NORVASC) 10 MG tablet Take 1 tablet (10 mg total) by mouth daily. 09/09/21   Wallis Bamberg, PA-C  amoxicillin-clavulanate (AUGMENTIN) [phone] MG tablet Take one tab PO Q8hr. 11/11/22   Lattie Haw, MD  benzonatate (TESSALON) 200 MG capsule Take 1 capsule (200 mg total) by mouth 3 (three) times daily as needed for cough. 08/13/22   Radford Pax, NP  carvedilol (COREG) 6.25 MG tablet Take 6.25 mg by mouth 2 (two) times daily. 05/16/20   [provider]  cyclobenzaprine (FLEXERIL) 5 MG tablet Take 1 tablet (5 mg total) by mouth at bedtime as needed. 02/14/23   Wallis Bamberg, PA-C  EPINEPHrine 0.3 mg/0.3 mL IJ SOAJ injection Inject 0.3 mg into the muscle as needed for anaphylaxis. 02/13/22   Carmel Sacramento A, PA-C  fluconazole (DIFLUCAN)  150 MG tablet Take 1 tablet (150 mg total) by mouth daily. 10/08/22   Radford Pax, NP  fluticasone (FLONASE) 50 MCG/ACT nasal spray Place 1 spray into both nostrils daily. Begin by using 2 sprays in each nare daily for 3 to 5 days, then decrease to 1 spray in each nare daily. 11/05/21   Theadora Rama Scales, PA-C  furosemide (LASIX) 20 MG tablet Take 1 tablet (20 mg total) by mouth daily. 05/28/21   Geoffery Lyons, MD  gabapentin (NEURONTIN) 100 MG capsule Take 1 capsule (100 mg total) by mouth 3 (three) times daily. 10/15/21   Myra Rude, MD  guaifenesin (HUMIBID E) 400 MG TABS tablet Take 1 tablet 3 times daily as needed for chest congestion and cough 11/05/21   Theadora Rama Scales, PA-C  hydrochlorothiazide (HYDRODIURIL) 25 MG tablet Take 1 tablet (25 mg total) by mouth daily. 07/21/20   Mancel Bale, MD  losartan (COZAAR) 100 MG tablet Take 100 mg by mouth daily.    [provider]  metFORMIN (GLUCOPHAGE-XR) 500 MG 24 hr tablet Take 500 mg by mouth daily. 07/13/21   [provider]  Multiple Vitamin (MULTIVITAMIN WITH MINERALS) TABS tablet Take 1 tablet by mouth daily.    [provider]  naproxen (NAPROSYN) 500 MG tablet Take 1 tablet (500 mg total) by mouth 2 (two) times daily with a meal. 01/04/22   Wallis Bamberg, PA-C  OZEMPIC, 0.25 OR 0.5 MG/DOSE, 2 MG/1.5ML SOPN SMARTSIG:0.25 Milligram(s)  SUB-Q Once a Week 04/25/21   [provider]  predniSONE (DELTASONE) 20 MG tablet Take 2 tablets daily with breakfast. 02/14/23   Wallis Bamberg, PA-C  promethazine-dextromethorphan (PROMETHAZINE-DM) 6.25-15 MG/5ML syrup Take 5 mLs by mouth 4 (four) times daily as needed for cough. 11/05/21   Theadora Rama Scales, PA-C  tiZANidine (ZANAFLEX) 4 MG tablet Take 1 tablet (4 mg total) by mouth at bedtime. 01/04/22   Wallis Bamberg, PA-C      Allergies    Morphine and codeine    Review of Systems   Review of Systems  Neurological:  Positive for headaches.    Physical  Exam Updated Vital Signs BP (!) 179/106   Pulse 73   Temp 98.4 F (36.9 C) (Oral)   Resp 16   Ht 5\' 1"  (1.549 m)   Wt 101.2 kg   LMP 05/05/2019 (Approximate)   SpO2 99%   BMI 42.16 kg/m  Physical Exam Physical Exam  Constitutional: Pt is oriented to person, place, and time. Pt appears well-developed and well-nourished. No distress.  HENT:  Head: Normocephalic and atraumatic.  Mouth/Throat: Oropharynx is clear and moist.  Eyes: Conjunctivae and EOM are normal. Pupils are equal, round, and reactive to light. No scleral icterus.  No horizontal, vertical or rotational nystagmus  Neck: Normal range of motion. Neck supple.  Full active and passive ROM without pain No midline or paraspinal tenderness No nuchal rigidity or meningeal signs  Cardiovascular: Normal rate, regular rhythm and intact distal pulses.   Pulmonary/Chest: Effort normal and breath sounds normal. No respiratory distress. Pt has no wheezes. No rales.  Abdominal: Soft. Bowel sounds are normal. There is no tenderness. There is no rebound and no guarding.  Musculoskeletal: Normal range of motion.  Lymphadenopathy:    No cervical adenopathy.  Neurological: Pt. is alert and oriented to person, place, and time. He has normal reflexes. No cranial nerve deficit.  Exhibits normal muscle tone. Coordination normal.  Mental Status:  Alert, oriented, thought content appropriate. Speech fluent without evidence of aphasia. Able to follow 2 step commands without difficulty.  Cranial Nerves:  II:  Peripheral visual fields grossly normal, pupils equal, round, reactive to light III,IV, VI: ptosis not present, extra-ocular motions intact bilaterally  V,VII: smile symmetric, facial light touch sensation equal VIII: hearing grossly normal bilaterally  IX,X: midline uvula rise  XI: bilateral shoulder shrug equal and strong XII: midline tongue extension  Motor:  5/5 in upper and lower extremities bilaterally including strong and equal  grip strength and dorsiflexion/plantar flexion Sensory: Pinprick and light touch normal in all extremities.  Deep Tendon Reflexes: 2+ and symmetric  Cerebellar: normal finger-to-nose with bilateral upper extremities Gait: normal gait and balance CV: distal pulses palpable throughout   Skin: Skin is warm and dry. No rash noted. Pt is not diaphoretic.  Psychiatric: Pt has a normal mood and affect. Behavior is normal. Judgment and thought content normal.  Nursing note and vitals reviewed.  ED Results / Procedures / Treatments   Labs (all labs ordered are listed, but only abnormal results are displayed) Labs Reviewed  BASIC METABOLIC PANEL - Abnormal; Notable for the following components:      Result Value   Glucose, Bld 284 (*)    All other components within normal limits  RESP PANEL BY RT-PCR (RSV, FLU A&B, COVID)  [id number]  CBC WITH DIFFERENTIAL/PLATELET    EKG None  Radiology No results found.  Procedures Procedures    Medications Ordered in ED Medications  sodium  chloride 0.9 % bolus 1,000 mL (has no administration in time range)  droperidol (INAPSINE) 2.5 MG/ML injection 1.25 mg (has no administration in time range)  diazepam (VALIUM) injection 2.5 mg (has no administration in time range)    ED Course/ Medical Decision Making/ A&P Clinical Course as of 04/15/23 0030  Sat Apr 15, 2023  0030 BP: 127/69 [AH]    Clinical Course User Index [AH] Arthor Captain, PA-C                                 Medical Decision Making Amount and/or Complexity of Data Reviewed Labs: ordered.    Details: Labs without significant abnormality except for hyperglycemia advised to follow-up with PCP  Risk Prescription drug management.   Hallie Sando presents with headache Given the large differential diagnosis for [facility], the decision making in this case is of high complexity.  After evaluating all of the data points in this case, the presentation of Tyanna Peek is NOT  consistent with skull fracture, meningitis/encephalitis, SAH/sentinel bleed, Intracranial Hemorrhage (ICH) (subdural/epidural), acute obstructive hydrocephalus, space occupying lesions, CVA, CO Poisoning, Basilar/vertebral artery dissection, preeclampsia, cerebral venous thrombosis, hypertensive emergency, temporal Arteritis  Review of EMR shows previous MRI of the cervical spine showed empty sella turcica.  She does have body habitus consistent with someone who is at risk for IIH.  Given these findings I have ordered outpatient follow-up with neurology for her headaches.  Her blood pressure normalized after her headache resolved.  I suspect that her exhilarated hypertension is due to headache pain and not the other way around.   Strict return and follow-up precautions have been given by me personally or by detailed written instructions verbalized by nursing staff using the teach back method to patient/family/caregiver.  Data Reviewed/Counseling: I have reviewed the patient's vital signs, nursing notes, and other relevant tests/information. I had a detailed discussion regarding the historical points, exam findings, and any diagnostic results supporting the discharge diagnosis. 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

## 2023-04-15 NOTE — Discharge Instructions (Addendum)
 Your blood pressure normalized with your headache with resolution.  Suspect your blood pressure gets high when you are in severe pain.  I have given you a referral to neurology for your headaches.  Please follow-up with them as it appears you are having migraine headaches occasionally and they may be helpful to prescribe you migraine medications.  You are having a headache. No specific cause was found today for your headache. It may have been a migraine or other cause of headache. Stress, anxiety, fatigue, and depression are common triggers for headaches. Your headache today does not appear to be life-threatening or require hospitalization, but often the exact cause of headaches is not determined in the emergency department. Therefore, follow-up with your doctor is very important to find out what may have caused your headache, and whether or not you need any further diagnostic testing or treatment. Sometimes headaches can appear benign (not harmful), but then more serious symptoms can develop which should prompt an immediate re-evaluation by your doctor or the emergency department. SEEK MEDICAL ATTENTION IF: You develop possible problems with medications prescribed.  The medications don't resolve your headache, if it recurs , or if you have multiple episodes of vomiting or can't take fluids. You have a change from the usual headache. RETURN IMMEDIATELY IF you develop a sudden, severe headache or confusion, become poorly responsive or faint, develop a fever above 100.34F or problem breathing, have a change in speech, vision, swallowing, or understanding, or develop new weakness, numbness, tingling, incoordination, or have a seizure.

## 2023-05-01 ENCOUNTER — Ambulatory Visit: Payer: Self-pay

## 2023-05-05 ENCOUNTER — Ambulatory Visit (INDEPENDENT_AMBULATORY_CARE_PROVIDER_SITE_OTHER)

## 2023-05-05 ENCOUNTER — Ambulatory Visit
Admission: RE | Admit: 2023-05-05 | Discharge: 2023-05-05 | Disposition: A | Discharge status: Home / Self Care | Source: Ambulatory Visit | Attending: Family Medicine | Admitting: Family Medicine

## 2023-05-05 VITALS — BP 144/95 | HR 77 | Temp 98.8°F | Resp 16

## 2023-05-05 DIAGNOSIS — R109 Unspecified abdominal pain: Secondary | ICD-10-CM

## 2023-05-05 DIAGNOSIS — R1030 Lower abdominal pain, unspecified: Secondary | ICD-10-CM | POA: Diagnosis not present

## 2023-05-05 LAB — POCT URINALYSIS DIP (MANUAL ENTRY)
Bilirubin, UA: NEGATIVE
Blood, UA: NEGATIVE
Glucose, UA: 100 mg/dL — AB
Ketones, POC UA: NEGATIVE mg/dL
Leukocytes, UA: NEGATIVE
Nitrite, UA: NEGATIVE
Protein Ur, POC: 100 mg/dL — AB
Spec Grav, UA: >=1.03 — AB (ref 1.010–1.025)
Urobilinogen, UA: 0.2 U/dL
pH, UA: 6 (ref 5.0–8.0)

## 2023-05-05 MED ORDER — DICYCLOMINE HCL 20 MG PO TABS: 20.0000 mg | ORAL_TABLET | Freq: Two times a day (BID) | ORAL | 0 refills | Status: DC | PRN

## 2023-05-05 NOTE — Discharge Instructions (Signed)
 You may try Bentyl twice daily as needed for your abdominal cramping/spasms.  Continue to stay hydrated and eat a bland diet and advance as you tolerate.  Please go to the ER ASAP if your symptoms do not improve within the next 24 hours and/or they worsen.  The symptoms include but are not limited to worsening abdominal pain, vomiting, diarrhea, fevers, or any new concerns that arise.  Follow-up with your PCP in 2 days for recheck.

## 2023-05-05 NOTE — ED Provider Notes (Signed)
 [facility]    CSN: [id number] Arrival date & time: 05/05/23  1434      History   Chief Complaint Chief Complaint  Patient presents with   Abdominal Pain    UTI - Entered by patient    HPI Elaine Perez is a 38 y.o. female presents for abdominal pain.  Patient reports 3 days of an intermittent abdominal cramping in her lower suprapubic area that extends into her umbilicus.  Denies any nausea vomiting or diarrhea.  No fevers, but does endorse some bloating.  States last bowel movement was yesterday and was normal for her.  No constipation.  No history of GI diagnoses such as Crohn's, IBS, colitis.  She does have diabetes but denies being on GLP-1 medications.  States she is drinking normally but has a decreased appetite.  No URI symptoms or dysuria, vaginal discharge today concern..  She has been taking Aleve for her symptoms.  No other concerns at this time.   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 07/04/2019   Iron deficiency anemia due to chronic blood loss 08/31/2018   Iron deficiency anemia 04/27/2018   Leiomyoma of uterus, unspecified 06/15/2016   Anemia, deficiency 05/13/2015   DDD (degenerative disc disease), lumbar 05/16/2013   BMI 40.0-44.9, adult (HCC) 12/17/2012   OSA on CPAP 12/17/2012   Depression 05/11/2012   Type 2 diabetes mellitus with obesity (HCC) 12/23/2011   Hypertension 12/23/2011   Female pelvic pain 12/23/2011    Past Surgical History:  Procedure Laterality Date    ABDOMINAL HYSTERECTOMY     CHOLECYSTECTOMY     GALLBLADDER SURGERY     REPEAT CESAREAN SECTION      OB History     Gravida  3   Para      Term      Preterm      AB  0   Living  3      SAB  0   IAB  0   Ectopic  0   Multiple  0   Live Births  3            Home Medications    Prior to Admission medications   Medication Sig Start Date End Date Taking? Authorizing Provider  dicyclomine (BENTYL) 20 MG tablet Take 1 tablet (20 mg total) by mouth 2 (two) times daily as needed for spasms (abdominal cramping/spasms). 05/05/23  Yes Radford Pax, NP  albuterol (VENTOLIN HFA) 108 (90 Base) MCG/ACT inhaler Inhale 1-2 puffs into the lungs every 6 (six) hours as needed for wheezing or shortness of breath. 08/13/22   Radford Pax, NP  amLODipine (NORVASC) 10 MG tablet Take 1 tablet (10 mg total) by mouth daily. 09/09/21   Wallis Bamberg, PA-C  amoxicillin-clavulanate (AUGMENTIN) [phone] MG tablet Take one tab PO Q8hr. 11/11/22   Lattie Haw, MD  benzonatate (TESSALON) 200 MG capsule Take 1 capsule (200 mg total) by mouth 3 (three) times daily as needed for  cough. 08/13/22   Radford Pax, NP  carvedilol (COREG) 6.25 MG tablet Take 6.25 mg by mouth 2 (two) times daily. 05/16/20   [provider]  cyclobenzaprine (FLEXERIL) 5 MG tablet Take 1 tablet (5 mg total) by mouth at bedtime as needed. 02/14/23   Wallis Bamberg, PA-C  EPINEPHrine 0.3 mg/0.3 mL IJ SOAJ injection Inject 0.3 mg into the muscle as needed for anaphylaxis. 02/13/22   Carmel Sacramento A, PA-C  fluconazole (DIFLUCAN) 150 MG tablet Take 1 tablet (150 mg total) by mouth daily. 10/08/22   Radford Pax, NP  fluticasone (FLONASE) 50 MCG/ACT nasal spray Place 1 spray into both nostrils daily. Begin by using 2 sprays in each nare daily for 3 to 5 days, then decrease to 1 spray in each nare daily. 11/05/21   Theadora Rama Scales, PA-C  furosemide (LASIX) 20 MG tablet Take 1 tablet (20 mg total) by mouth daily. 05/28/21    Geoffery Lyons, MD  gabapentin (NEURONTIN) 100 MG capsule Take 1 capsule (100 mg total) by mouth 3 (three) times daily. 10/15/21   Myra Rude, MD  guaifenesin (HUMIBID E) 400 MG TABS tablet Take 1 tablet 3 times daily as needed for chest congestion and cough 11/05/21   Theadora Rama Scales, PA-C  hydrochlorothiazide (HYDRODIURIL) 25 MG tablet Take 1 tablet (25 mg total) by mouth daily. 07/21/20   Mancel Bale, MD  losartan (COZAAR) 100 MG tablet Take 100 mg by mouth daily.    [provider]  metFORMIN (GLUCOPHAGE-XR) 500 MG 24 hr tablet Take 500 mg by mouth daily. 07/13/21   [provider]  Multiple Vitamin (MULTIVITAMIN WITH MINERALS) TABS tablet Take 1 tablet by mouth daily.    [provider]  naproxen (NAPROSYN) 500 MG tablet Take 1 tablet (500 mg total) by mouth 2 (two) times daily with a meal. 01/04/22   Wallis Bamberg, PA-C  OZEMPIC, 0.25 OR 0.5 MG/DOSE, 2 MG/1.5ML SOPN SMARTSIG:0.25 Milligram(s) SUB-Q Once a Week 04/25/21   [provider]  predniSONE (DELTASONE) 20 MG tablet Take 2 tablets daily with breakfast. 02/14/23   Wallis Bamberg, PA-C  promethazine-dextromethorphan (PROMETHAZINE-DM) 6.25-15 MG/5ML syrup Take 5 mLs by mouth 4 (four) times daily as needed for cough. 11/05/21   Theadora Rama Scales, PA-C  tiZANidine (ZANAFLEX) 4 MG tablet Take 1 tablet (4 mg total) by mouth at bedtime. 01/04/22   Wallis Bamberg, PA-C    Family History Family History  Problem Relation Age of Onset   Breast cancer Other    Heart disease Mother    Lupus Maternal Aunt    Stomach cancer Maternal Aunt    Colon cancer Neg Hx    Esophageal cancer Neg Hx    Rectal cancer Neg Hx     Social History Social History   Tobacco Use   Smoking status: Never   Smokeless tobacco: Never  Vaping Use   Vaping status: Never Used  Substance Use Topics   Alcohol use: Never   Drug use: Never     Allergies   Morphine and codeine   Review of Systems Review of Systems   Gastrointestinal:  Positive for abdominal pain.     Physical Exam Triage Vital Signs ED Triage Vitals [05/05/23 1523]  Encounter Vitals Group     BP (!) 144/95     Systolic BP Percentile      Diastolic BP Percentile      Pulse Rate 77     Resp 16     Temp  98.8 F (37.1 C)     Temp Source Oral     SpO2 95 %     Weight      Height      Head Circumference      Peak Flow      Pain Score 7     Pain Loc      Pain Education      Exclude from Growth Chart    No data found.  Updated Vital Signs BP (!) 144/95 (BP Location: Right Arm)   Pulse 77   Temp 98.8 F (37.1 C) (Oral)   Resp 16   LMP 05/05/2019 (Approximate)   SpO2 95%   Visual Acuity Right Eye Distance:   Left Eye Distance:   Bilateral Distance:    Right Eye Near:   Left Eye Near:    Bilateral Near:     Physical Exam Vitals and nursing note reviewed.  Constitutional:      General: She is not in acute distress.    Appearance: Normal appearance. She is obese. She is not ill-appearing.  HENT:     Head: Normocephalic and atraumatic.  Eyes:     Pupils: Pupils are equal, round, and reactive to light.  Cardiovascular:     Rate and Rhythm: Normal rate.  Pulmonary:     Effort: Pulmonary effort is normal.  Abdominal:     General: Bowel sounds are normal.     Palpations: Abdomen is soft.     Tenderness: There is abdominal tenderness in the periumbilical area and suprapubic area. There is no right CVA tenderness, left CVA tenderness or guarding. Negative signs include Rovsing's sign and McBurney's sign.  Skin:    General: Skin is warm and dry.  Neurological:     General: No focal deficit present.     Mental Status: She is alert and oriented to person, place, and time.  Psychiatric:        Mood and Affect: Mood normal.        Behavior: Behavior normal.      UC Treatments / Results  Labs (all labs ordered are listed, but only abnormal results are displayed) Labs Reviewed  POCT URINALYSIS DIP (MANUAL  ENTRY) - Abnormal; Notable for the following components:      Result Value   Glucose, UA =100 (*)    Spec Grav, UA >=1.030 (*)    Protein Ur, POC =100 (*)    All other components within normal limits    EKG   Radiology No results found.  Procedures Procedures (including critical care time)  Medications Ordered in UC Medications - No data to display  Initial Impression / Assessment and Plan / UC Course  I have reviewed the triage vital signs and the nursing notes.  Pertinent labs & imaging results that were available during my care of the patient were reviewed by me and considered in my medical decision making (see chart for details).     I reviewed exam and symptoms with patient.  Discussed limitations and abilities of urgent care.  Abdominal x-ray wet read without any obvious indications of patient's symptoms.  Will contact for any positive results based on radiology overread.  Discussed ER evaluation given limited ability to workup abdominal pain in this setting but patient declined.  Will do trial of Bentyl with strict instruction if symptoms do not improve within the next 24 hours and/or they worsen, red flags reviewed, she is to go to the ER ASAP and she verbalized understanding.  Patient advised to see her PCP in 2 days for recheck. Final Clinical Impressions(s) / UC Diagnoses   Final diagnoses:  Lower abdominal pain  Abdominal cramping     Discharge Instructions      You may try Bentyl twice daily as needed for your abdominal cramping/spasms.  Continue to stay hydrated and eat a bland diet and advance as you tolerate.  Please go to the ER ASAP if your symptoms do not improve within the next 24 hours and/or they worsen.  The symptoms include but are not limited to worsening abdominal pain, vomiting, diarrhea, fevers, or any new concerns that arise.  Follow-up with your PCP in 2 days for recheck.     ED Prescriptions     Medication Sig Dispense Auth. Provider    dicyclomine (BENTYL) 20 MG tablet Take 1 tablet (20 mg total) by mouth 2 (two) times daily as needed for spasms (abdominal cramping/spasms). 20 tablet Radford Pax, NP      PDMP not reviewed this encounter.   Radford Pax, NP 05/05/23 [phone]

## 2023-05-05 NOTE — ED Triage Notes (Signed)
 Patient presents to UC for abdominal cramping x 3 days ago. Treating symptoms with aleve.  Denies urinary symptoms, fever, n/v or diarrhea. No concern for STDs.

## 2023-05-25 ENCOUNTER — Ambulatory Visit: Payer: Self-pay

## 2023-05-26 ENCOUNTER — Encounter: Payer: Self-pay | Admitting: Hematology and Oncology

## 2023-05-26 ENCOUNTER — Ambulatory Visit: Payer: Self-pay

## 2023-07-06 ENCOUNTER — Ambulatory Visit
Admission: RE | Admit: 2023-07-06 | Discharge: 2023-07-06 | Disposition: A | Discharge status: Home / Self Care | Source: Ambulatory Visit | Attending: Emergency Medicine | Admitting: Emergency Medicine

## 2023-07-06 ENCOUNTER — Other Ambulatory Visit: Payer: Self-pay

## 2023-07-06 VITALS — BP 151/93 | HR 87 | Temp 99.1°F | Resp 18

## 2023-07-06 DIAGNOSIS — K5900 Constipation, unspecified: Secondary | ICD-10-CM | POA: Insufficient documentation

## 2023-07-06 DIAGNOSIS — R1084 Generalized abdominal pain: Secondary | ICD-10-CM | POA: Diagnosis not present

## 2023-07-06 DIAGNOSIS — R140 Abdominal distension (gaseous): Secondary | ICD-10-CM | POA: Diagnosis present

## 2023-07-06 DIAGNOSIS — N898 Other specified noninflammatory disorders of vagina: Secondary | ICD-10-CM | POA: Diagnosis not present

## 2023-07-06 DIAGNOSIS — Z113 Encounter for screening for infections with a predominantly sexual mode of transmission: Secondary | ICD-10-CM | POA: Insufficient documentation

## 2023-07-06 DIAGNOSIS — R110 Nausea: Secondary | ICD-10-CM

## 2023-07-06 DIAGNOSIS — K219 Gastro-esophageal reflux disease without esophagitis: Secondary | ICD-10-CM | POA: Insufficient documentation

## 2023-07-06 LAB — POCT URINALYSIS DIP (MANUAL ENTRY)
Bilirubin, UA: NEGATIVE
Blood, UA: NEGATIVE
Glucose, UA: 100 mg/dL — AB
Ketones, POC UA: NEGATIVE mg/dL
Leukocytes, UA: NEGATIVE
Nitrite, UA: NEGATIVE
Protein Ur, POC: 100 mg/dL — AB
Spec Grav, UA: >=1.03 — AB
Urobilinogen, UA: 0.2 U/dL
pH, UA: 5.5

## 2023-07-06 MED ORDER — ONDANSETRON 4 MG PO TBDP: 4.0000 mg | ORAL_TABLET | Freq: Four times a day (QID) | ORAL | 0 refills | Status: AC | PRN

## 2023-07-06 NOTE — ED Triage Notes (Signed)
 Pt c/o nausea, bloated and generalized abdominal pain, frequent urinationx1wk

## 2023-07-06 NOTE — ED Provider Notes (Signed)
 [facility]    CSN: [id number] Arrival date & time: 07/06/23  1334      History   Chief Complaint Chief Complaint  Patient presents with   Abdominal Pain    UTI - Entered by patient    HPI Elaine Perez is a 38 y.o. female.  1 week history of urinary frequency, generalized abdominal discomfort, bloating and nausea. Also increased reflux symptoms. 3-4 days of hard stools, straining with BM. No blood. Denies fever or flank pain. No dysuria. Not having vomiting or diarrhea.  Is prescribed Protonix  but takes infrequently Was recently stopped on ozempic, waiting for prior auth for mounjaro  Some vaginal discharge for a day or so, would like STD testing. Not having odor, itching, rash.  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 07/04/2019   Iron deficiency anemia due to chronic blood loss 08/31/2018   Iron deficiency anemia 04/27/2018   Leiomyoma of uterus, unspecified 06/15/2016   Anemia, deficiency 05/13/2015   DDD (degenerative disc disease), lumbar 05/16/2013   BMI 40.0-44.9, adult (HCC) 12/17/2012   OSA on CPAP 12/17/2012   Depression 05/11/2012   Type 2 diabetes mellitus with obesity (HCC) 12/23/2011   Hypertension 12/23/2011   Female pelvic pain 12/23/2011    Past Surgical History:  Procedure Laterality Date   ABDOMINAL HYSTERECTOMY     CHOLECYSTECTOMY     GALLBLADDER SURGERY     REPEAT CESAREAN SECTION      OB History     Gravida  3   Para      Term      Preterm       AB  0   Living  3      SAB  0   IAB  0   Ectopic  0   Multiple  0   Live Births  3            Home Medications    Prior to Admission medications   Medication Sig Start Date End Date Taking? Authorizing Provider  dapagliflozin propanediol (FARXIGA) 10 MG TABS tablet Take 10 mg by mouth. 08/15/22 08/15/23 Yes [provider]  ondansetron  (ZOFRAN -ODT) 4 MG disintegrating tablet Take 1 tablet (4 mg total) by mouth every 6 (six) hours as needed for nausea or vomiting. 07/06/23  Yes Tienna Bienkowski, Ivette Marks, PA-C  albuterol  (VENTOLIN  HFA) 108 (90 Base) MCG/ACT inhaler Inhale 1-2 puffs into the lungs every 6 (six) hours as needed for wheezing or shortness of breath. 08/13/22   Alleen Arbour, NP  amLODipine  (NORVASC ) 10 MG tablet Take 1 tablet (10 mg total) by mouth daily. 09/09/21   Adolph Hoop, PA-C  carvedilol (COREG) 6.25 MG tablet Take 6.25 mg by mouth 2 (two) times daily. 05/16/20   [provider]  cyclobenzaprine  (FLEXERIL ) 5 MG tablet Take 1 tablet (5 mg total) by mouth at bedtime as needed. 02/14/23   Adolph Hoop, PA-C  dicyclomine  (BENTYL ) 20 MG tablet Take 1 tablet (20 mg total) by mouth  2 (two) times daily as needed for spasms (abdominal cramping/spasms). 05/05/23   Mayer, Jodi R, NP  EPINEPHrine  0.3 mg/0.3 mL IJ SOAJ injection Inject 0.3 mg into the muscle as needed for anaphylaxis. 02/13/22   Baxter Limber A, PA-C  furosemide  (LASIX ) 20 MG tablet Take 1 tablet (20 mg total) by mouth daily. 05/28/21   Orvilla Blander, MD  gabapentin  (NEURONTIN ) 100 MG capsule Take 1 capsule (100 mg total) by mouth 3 (three) times daily. 10/15/21   Margaree Shark, MD  hydrochlorothiazide  (HYDRODIURIL ) 25 MG tablet Take 1 tablet (25 mg total) by mouth daily. 07/21/20   Carlton Chick, MD  losartan (COZAAR) 100 MG tablet Take 100 mg by mouth daily.    [provider]  metFORMIN (GLUCOPHAGE-XR) 500 MG 24 hr tablet Take 500 mg by mouth daily. 07/13/21   [provider]  Multiple  Vitamin (MULTIVITAMIN WITH MINERALS) TABS tablet Take 1 tablet by mouth daily.    [provider]  naproxen  (NAPROSYN ) 500 MG tablet Take 1 tablet (500 mg total) by mouth 2 (two) times daily with a meal. 01/04/22   Adolph Hoop, PA-C    Family History Family History  Problem Relation Age of Onset   Breast cancer Other    Heart disease Mother    Lupus Maternal Aunt    Stomach cancer Maternal Aunt    Colon cancer Neg Hx    Esophageal cancer Neg Hx    Rectal cancer Neg Hx     Social History Social History   Tobacco Use   Smoking status: Never   Smokeless tobacco: Never  Vaping Use   Vaping status: Never Used  Substance Use Topics   Alcohol use: Never   Drug use: Never     Allergies   Morphine and codeine   Review of Systems Review of Systems Per HPI  Physical Exam Triage Vital Signs ED Triage Vitals  Encounter Vitals Group     BP 07/06/23 1342 (!) 151/93     Systolic BP Percentile --      Diastolic BP Percentile --      Pulse Rate 07/06/23 1342 87     Resp 07/06/23 1342 18     Temp 07/06/23 1342 99.1 F (37.3 C)     Temp Source 07/06/23 1342 Oral     SpO2 07/06/23 1342 95 %     Weight --      Height --      Head Circumference --      Peak Flow --      Pain Score 07/06/23 1339 0     Pain Loc --      Pain Education --      Exclude from Growth Chart --    No data found.  Updated Vital Signs BP (!) 151/93   Pulse 87   Temp 99.1 F (37.3 C) (Oral)   Resp 18   LMP 05/05/2019 (Approximate)   SpO2 95%   Visual Acuity Right Eye Distance:   Left Eye Distance:   Bilateral Distance:    Right Eye Near:   Left Eye Near:    Bilateral Near:     Physical Exam Vitals and nursing note reviewed.  Constitutional:      Appearance: Normal appearance.  HENT:     Mouth/Throat:     Mouth: Mucous membranes are moist.     Pharynx: Oropharynx is clear.  Eyes:     Conjunctiva/sclera: Conjunctivae normal.  Cardiovascular:     Rate and  Rhythm: Normal  rate and regular rhythm.     Heart sounds: Normal heart sounds.  Pulmonary:     Effort: Pulmonary effort is normal. No respiratory distress.     Breath sounds: Normal breath sounds.  Abdominal:     General: Abdomen is protuberant. Bowel sounds are normal.     Palpations: Abdomen is soft.     Tenderness: There is generalized abdominal tenderness and tenderness in the epigastric area. There is no right CVA tenderness, left CVA tenderness, guarding or rebound.     Comments: Generalized discomfort, increased tenderness epigastric, no guarding or rebound. Habitus limits exam.   Musculoskeletal:        General: Normal range of motion.  Skin:    General: Skin is warm and dry.  Neurological:     Mental Status: She is alert and oriented to person, place, and time.     UC Treatments / Results  Labs (all labs ordered are listed, but only abnormal results are displayed) Labs Reviewed  POCT URINALYSIS DIP (MANUAL ENTRY) - Abnormal; Notable for the following components:      Result Value   Glucose, UA =100 (*)    Spec Grav, UA >=1.030 (*)    Protein Ur, POC =100 (*)    All other components within normal limits  CERVICOVAGINAL ANCILLARY ONLY    EKG  Radiology No results found.  Procedures Procedures (including critical care time)  Medications Ordered in UC Medications - No data to display  Initial Impression / Assessment and Plan / UC Course  I have reviewed the triage vital signs and the nursing notes.  Pertinent labs & imaging results that were available during my care of the patient were reviewed by me and considered in my medical decision making (see chart for details).  Afebrile, overall well-appearing. No red flags.  UA without signs of infection. Elevated spec grav. Some protein and glucose. Discussed with patient not urinary etiology at this time. She has performed a vaginal self swab, will test for STDs and BV/yeast.  Discussed any of these could be cause of abdominal  discomfort.  I also suspect constipation and reflux are related to bloating and nausea.  Recommend starting MiraLAX, re-starting her Protonix  daily and use consistently, avoid food triggers, Zofran  q6 hours prn for nausea. Strict ED precautions are discussed.  Patient verbalizes understanding.  I have also recommended she follow closely with her primary care provider regarding diabetes management.  Final Clinical Impressions(s) / UC Diagnoses   Final diagnoses:  Generalized abdominal pain  Bloating  Nausea  Gastroesophageal reflux disease without esophagitis  Constipation, unspecified constipation type  Screen for STD (sexually transmitted disease)  Vaginal discharge     Discharge Instructions      Your bloating and nausea can be due to constipation.  I recommend trying MiraLAX 2 times daily for the next few days, then decrease to once daily.  Monitor bowel movements and symptoms. The zofran  can be used every 6 hours as needed to settle the stomach I also recommend restarting your Protonix  once daily -- use consistently every day for 2 weeks in a row. The best time to take is first thing in the morning and empty stomach.  Please contact your primary care provider to discuss diabetes management. If at any point your symptoms worsen or become severe, go to the emergency department   ED Prescriptions     Medication Sig Dispense Auth. Provider   ondansetron  (ZOFRAN -ODT) 4 MG disintegrating tablet Take 1 tablet (  4 mg total) by mouth every 6 (six) hours as needed for nausea or vomiting. 20 tablet Andyn Sales, Ivette Marks, PA-C      PDMP not reviewed this encounter.   Creighton Doffing, [place] 07/06/23 1514

## 2023-07-06 NOTE — Discharge Instructions (Addendum)
 Your bloating and nausea can be due to constipation.  I recommend trying MiraLAX 2 times daily for the next few days, then decrease to once daily.  Monitor bowel movements and symptoms. The zofran  can be used every 6 hours as needed to settle the stomach I also recommend restarting your Protonix  once daily -- use consistently every day for 2 weeks in a row. The best time to take is first thing in the morning and empty stomach.  Please contact your primary care provider to discuss diabetes management. If at any point your symptoms worsen or become severe, go to the emergency department

## 2023-07-07 LAB — CERVICOVAGINAL ANCILLARY ONLY
Bacterial Vaginitis (gardnerella): POSITIVE — AB
Candida Glabrata: NEGATIVE
Candida Vaginitis: NEGATIVE
Chlamydia: NEGATIVE
Comment: NEGATIVE
Comment: NEGATIVE
Comment: NEGATIVE
Comment: NEGATIVE
Comment: NEGATIVE
Comment: NORMAL
Neisseria Gonorrhea: NEGATIVE
Trichomonas: NEGATIVE

## 2023-07-10 ENCOUNTER — Ambulatory Visit (HOSPITAL_COMMUNITY): Payer: Self-pay

## 2023-07-10 MED ORDER — METRONIDAZOLE 500 MG PO TABS: 500.0000 mg | ORAL_TABLET | Freq: Two times a day (BID) | ORAL | 0 refills | Status: AC

## 2023-07-11 ENCOUNTER — Telehealth: Payer: Self-pay

## 2023-07-11 MED ORDER — FLUCONAZOLE 150 MG PO TABS: 150.0000 mg | ORAL_TABLET | ORAL | 0 refills | Status: AC

## 2023-07-11 NOTE — Telephone Encounter (Signed)
 Pt called, because she had a question about her test results. I informed pt of results and that the nurse informed her on mychart of her results and that meds were sent in.

## 2023-07-11 NOTE — Telephone Encounter (Signed)
 Pt called requesting abx to treat yeast infection after taking flagyl 

## 2023-08-17 ENCOUNTER — Other Ambulatory Visit

## 2023-08-23 NOTE — Progress Notes (Unsigned)
 [facility]  PATIENT: Elaine Perez DOB: 02-01-1986  REFERRING DOCTOR OR PCP: Lavanda Lesches, PA-C SOURCE: Patient, notes from primary care  _________________________________   HISTORICAL  CHIEF COMPLAINT:  No chief complaint on file.   HISTORY OF PRESENT ILLNESS:  I had the pleasure of seeing your patient, Elaine Perez, at [facility] for neurologic consultation regarding her partially empty sella turcica.  Imaging personally reviewed: MRI of the cervical spine 12/05/2022 showed a partially empty sella turcica (thin rim of pituitary tissue in the mildly enlarged sella turcica..  Potential narrowing at the sigmoid transverse sinus junction.  No significant degenerative changes are noted.  CT scans of the head 06/21/2021 and 07/21/2020 showed no acute findings.  There is a partially empty sella, appearing the same as of 12/05/2022 cervical spine MRI.   REVIEW OF SYSTEMS: Constitutional: No fevers, chills, sweats, or change in appetite Eyes: No visual changes, double vision, eye pain Ear, nose and throat: No hearing loss, ear pain, nasal congestion, sore throat Cardiovascular: No chest pain, palpitations Respiratory:  No shortness of breath at rest or with exertion.   No wheezes GastrointestinaI: No nausea, vomiting, diarrhea, abdominal pain, fecal incontinence Genitourinary:  No dysuria, urinary retention or frequency.  No nocturia. Musculoskeletal:  No neck pain, back pain Integumentary: No rash, pruritus, skin lesions Neurological: as above Psychiatric: No depression at this time.  No anxiety Endocrine: No palpitations, diaphoresis, change in appetite, change in weigh or increased thirst Hematologic/Lymphatic:  No anemia, purpura, petechiae. Allergic/Immunologic: No itchy/runny eyes, nasal congestion, recent allergic reactions, rashes  ALLERGIES: Allergies  Allergen Reactions   Morphine And Codeine Itching and Rash     swelling swelling swelling swelling     HOME MEDICATIONS:  Current Outpatient Medications:    albuterol  (VENTOLIN  HFA) 108 (90 Base) MCG/ACT inhaler, Inhale 1-2 puffs into the lungs every 6 (six) hours as needed for wheezing or shortness of breath., Disp: 1 each, Rfl: 0   amLODipine  (NORVASC ) 10 MG tablet, Take 1 tablet (10 mg total) by mouth daily., Disp: 90 tablet, Rfl: 0   carvedilol (COREG) 6.25 MG tablet, Take 6.25 mg by mouth 2 (two) times daily., Disp: , Rfl:    cyclobenzaprine  (FLEXERIL ) 5 MG tablet, Take 1 tablet (5 mg total) by mouth at bedtime as needed., Disp: 30 tablet, Rfl: 0   dicyclomine  (BENTYL ) 20 MG tablet, Take 1 tablet (20 mg total) by mouth 2 (two) times daily as needed for spasms (abdominal cramping/spasms)., Disp: 20 tablet, Rfl: 0   EPINEPHrine  0.3 mg/0.3 mL IJ SOAJ injection, Inject 0.3 mg into the muscle as needed for anaphylaxis., Disp: 2 each, Rfl: 0   furosemide  (LASIX ) 20 MG tablet, Take 1 tablet (20 mg total) by mouth daily., Disp: 30 tablet, Rfl: 0   gabapentin  (NEURONTIN ) 100 MG capsule, Take 1 capsule (100 mg total) by mouth 3 (three) times daily., Disp: 60 capsule, Rfl: 1   hydrochlorothiazide  (HYDRODIURIL ) 25 MG tablet, Take 1 tablet (25 mg total) by mouth daily., Disp: 30 tablet, Rfl: 1   losartan (COZAAR) 100 MG tablet, Take 100 mg by mouth daily., Disp: , Rfl:    metFORMIN (GLUCOPHAGE-XR) 500 MG 24 hr tablet, Take 500 mg by mouth daily., Disp: , Rfl:    Multiple Vitamin (MULTIVITAMIN WITH MINERALS) TABS tablet, Take 1 tablet by mouth daily., Disp: , Rfl:    naproxen  (NAPROSYN ) 500 MG tablet, Take 1 tablet (500 mg total) by mouth 2 (two) times daily with a meal., Disp: 30 tablet,  Rfl: 0   ondansetron  (ZOFRAN -ODT) 4 MG disintegrating tablet, Take 1 tablet (4 mg total) by mouth every 6 (six) hours as needed for nausea or vomiting., Disp: 20 tablet, Rfl: 0  PAST MEDICAL HISTORY: Past Medical History:  Diagnosis Date   Asthma    Blood transfusion  without reported diagnosis 2019   Diabetes mellitus without complication (HCC)    Fibroids    Hypertension    Iron deficiency anemia 04/27/2018   Sleep apnea    Does not use CPAP    PAST SURGICAL HISTORY: Past Surgical History:  Procedure Laterality Date   ABDOMINAL HYSTERECTOMY     CHOLECYSTECTOMY     GALLBLADDER SURGERY     REPEAT CESAREAN SECTION      FAMILY HISTORY: Family History  Problem Relation Age of Onset   Breast cancer Other    Heart disease Mother    Lupus Maternal Aunt    Stomach cancer Maternal Aunt    Colon cancer Neg Hx    Esophageal cancer Neg Hx    Rectal cancer Neg Hx     SOCIAL HISTORY: Social History   Socioeconomic History   Marital status: Married    Spouse name: Not on file   Number of children: 3   Years of education: Not on file   Highest education level: Not on file  Occupational History   Not on file  Tobacco Use   Smoking status: Never   Smokeless tobacco: Never  Vaping Use   Vaping status: Never Used  Substance and Sexual Activity   Alcohol use: Never   Drug use: Never   Sexual activity: Not Currently    Birth control/protection: Surgical  Other Topics Concern   Not on file  Social History Narrative   ** Merged History Encounter **       Social Drivers of Health   Financial Resource Strain: Medium Risk (04/13/2023)   Received from [facility]   Overall Financial Resource Strain (CARDIA)    Difficulty of Paying Living Expenses: Somewhat hard  Food Insecurity: No Food Insecurity (04/13/2023)   Received from [facility]   Hunger Vital Sign    Within the past 12 months, you worried that your food would run out before you got the money to buy more.: Never true    Within the past 12 months, the food you bought just didn't last and you didn't have money to get more.: Never true  Transportation Needs: No Transportation Needs (04/13/2023)   Received from [facility]  - Transportation    In the past 12 months, has lack of transportation kept you from medical appointments or from getting medications?: No    Lack of Transportation (Non-Medical): No  Physical Activity: Sufficiently Active (10/05/2021)   Received from [facility]   Exercise Vital Sign    On average, how many days per week do you engage in moderate to strenuous exercise (like a brisk walk)?: 7 days    On average, how many minutes do you engage in exercise at this level?: 30 min  Stress: Stress Concern Present (10/05/2021)   Received from [facility] - Occupational Stress Questionnaire    Feeling of Stress : Very much  Social Connections: Unknown (12/09/2022)   Received from [facility]   Social Network    Social Network: Not on file  Intimate Partner Violence: Unknown (12/09/2022)   Received  from [facility]   HITS    Physically Hurt: Not on file    Insult or Talk Down To: Not on file    Threaten Physical Harm: Not on file    Scream or Curse: Not on file       PHYSICAL EXAM  There were no vitals filed for this visit.  There is no height or weight on file to calculate BMI.   General: The patient is well-developed and well-nourished and in no acute distress  HEENT:  Head is [place]/AT.  Sclera are anicteric.  Funduscopic exam shows normal optic discs and retinal vessels.  Neck: No carotid bruits are noted.  The neck is nontender.  Cardiovascular: The heart has a regular rate and rhythm with a normal S1 and S2. There were no murmurs, gallops or rubs.    Skin: Extremities are without rash or  edema.  Musculoskeletal:  Back is nontender  Neurologic Exam  Mental status: The patient is alert and oriented x 3 at the time of the examination. The patient has apparent normal recent and remote memory, with an apparently normal attention span and concentration ability.   Speech is normal.  Cranial nerves:  Extraocular movements are full. Pupils are equal, round, and reactive to light and accomodation.  Visual fields are full.  Facial symmetry is present. There is good facial sensation to soft touch bilaterally.Facial strength is normal.  Trapezius and sternocleidomastoid strength is normal. No dysarthria is noted.  The tongue is midline, and the patient has symmetric elevation of the soft palate. No obvious hearing deficits are noted.  Motor:  Muscle bulk is normal.   Tone is normal. Strength is  5 / 5 in all 4 extremities.   Sensory: Sensory testing is intact to pinprick, soft touch and vibration sensation in all 4 extremities.  Coordination: Cerebellar testing reveals good finger-nose-finger and heel-to-shin bilaterally.  Gait and station: Station is normal.   Gait is normal. Tandem gait is normal. Romberg is negative.   Reflexes: Deep tendon reflexes are symmetric and normal bilaterally.   Plantar responses are flexor.    DIAGNOSTIC DATA (LABS, IMAGING, TESTING) - I reviewed patient records, labs, notes, testing and imaging myself where available.  Lab Results  Component Value Date   WBC 8.1 04/14/2023   HGB 14.1 04/14/2023   HCT 42.8 04/14/2023   MCV 88.4 04/14/2023   PLT 269 04/14/2023      Component Value Date/Time   NA 136 04/14/2023 2009   K 3.8 04/14/2023 2009   CL 102 04/14/2023 2009   CO2 25 04/14/2023 2009   GLUCOSE 284 (H) 04/14/2023 2009   BUN 14 04/14/2023 2009   CREATININE 0.76 04/14/2023 2009   CREATININE 0.85 09/06/2019 0956   CALCIUM 9.0 04/14/2023 2009   PROT 7.2 12/04/2022 1446   ALBUMIN 4.2 12/04/2022 1446   AST 14 (L) 12/04/2022 1446   AST 21 09/06/2019 0956   ALT 24 12/04/2022 1446   ALT 25 09/06/2019 0956   ALKPHOS 68 12/04/2022 1446   BILITOT 0.3 12/04/2022 1446   BILITOT <0.2 (L) 09/06/2019 0956   GFRNONAA >60 04/14/2023 2009   GFRNONAA >60 09/06/2019 0956   GFRAA >60 09/11/2019 0749   GFRAA >60 09/06/2019 0956   No results found for: CHOL,  HDL, LDLCALC, LDLDIRECT, TRIG, CHOLHDL No results found for: YHAJ8R Lab Results  Component Value Date   VITAMINB12 232 04/27/2018   No results found for: TSH     ASSESSMENT AND PLAN  ***  Nasia Cannan A. Vear, MD, Teola RENO 08/23/2023, 8:21 PM Certified in Neurology, Clinical Neurophysiology, Sleep Medicine and Neuroimaging  [facility] [address], [address] [place], [place] [address] [phone]

## 2023-08-24 ENCOUNTER — Ambulatory Visit (INDEPENDENT_AMBULATORY_CARE_PROVIDER_SITE_OTHER): Admitting: Neurology

## 2023-08-24 ENCOUNTER — Encounter: Payer: Self-pay | Admitting: Neurology

## 2023-08-24 ENCOUNTER — Encounter: Payer: Self-pay | Admitting: Hematology and Oncology

## 2023-08-24 VITALS — BP 138/84 | HR 85 | Ht 61.0 in | Wt 238.2 lb

## 2023-08-24 DIAGNOSIS — G5603 Carpal tunnel syndrome, bilateral upper limbs: Secondary | ICD-10-CM | POA: Diagnosis not present

## 2023-08-24 DIAGNOSIS — H93A9 Pulsatile tinnitus, unspecified ear: Secondary | ICD-10-CM

## 2023-08-24 DIAGNOSIS — G4489 Other headache syndrome: Secondary | ICD-10-CM

## 2023-08-24 DIAGNOSIS — R200 Anesthesia of skin: Secondary | ICD-10-CM | POA: Diagnosis not present

## 2023-08-24 MED ORDER — TOPIRAMATE 50 MG PO TABS: ORAL_TABLET | ORAL | 5 refills | Status: AC

## 2023-09-08 ENCOUNTER — Ambulatory Visit
Admission: RE | Admit: 2023-09-08 | Discharge: 2023-09-08 | Disposition: A | Discharge status: Home / Self Care | Payer: Self-pay | Source: Ambulatory Visit | Attending: Family Medicine | Admitting: Family Medicine

## 2023-09-08 VITALS — BP 180/100 | HR 67 | Temp 97.5°F | Resp 19

## 2023-09-08 DIAGNOSIS — N898 Other specified noninflammatory disorders of vagina: Secondary | ICD-10-CM | POA: Insufficient documentation

## 2023-09-08 DIAGNOSIS — M545 Low back pain, unspecified: Secondary | ICD-10-CM | POA: Diagnosis not present

## 2023-09-08 DIAGNOSIS — I10 Essential (primary) hypertension: Secondary | ICD-10-CM | POA: Insufficient documentation

## 2023-09-08 LAB — POCT URINALYSIS DIP (MANUAL ENTRY)
Bilirubin, UA: NEGATIVE
Blood, UA: NEGATIVE
Glucose, UA: 100 mg/dL — AB
Ketones, POC UA: NEGATIVE mg/dL
Leukocytes, UA: NEGATIVE
Nitrite, UA: NEGATIVE
Protein Ur, POC: >=300 mg/dL — AB
Spec Grav, UA: 1.025 (ref 1.010–1.025)
Urobilinogen, UA: 0.2 U/dL
pH, UA: 6 (ref 5.0–8.0)

## 2023-09-08 NOTE — ED Provider Notes (Signed)
 [facility]    CSN: [id number] Arrival date & time: 09/08/23  0859      History   Chief Complaint Chief Complaint  Patient presents with   Back Pain    Entered by patient   Urinary Frequency   Vaginal Itching    HPI Elaine Perez is a 38 y.o. female with a past medical history of asthma, diabetes, hypertension, sleep apnea presents for urinary frequency and vaginal irritation.  Patient reports 1 week ago she switched her detergents and has since been having intermittent vaginal irritation with itching.  She denies any vaginal discharge or vaginal rashes/lesions.  Endorses urinary frequency but denies burning, urgency, hematuria, fevers, nausea/vomiting.  Does endorse bilateral lower back pain, no flank pain.  No STD exposure or concern.  She has a history of hypertension and has not taken any of her blood pressure medications today.  Her blood pressure is noted to be 180/100.  She also states she has been taking a lot of ibuprofen  due to her low back pain.  She denies any chest pain, shortness of breath, dizziness, visual changes, headaches.  No other concerns at this time.   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 07/04/2019   Iron deficiency anemia due to chronic blood loss 08/31/2018   Iron deficiency anemia 04/27/2018   Leiomyoma of uterus, unspecified 06/15/2016   Anemia, deficiency 05/13/2015   DDD (degenerative disc disease), lumbar 05/16/2013   BMI  40.0-44.9, adult (HCC) 12/17/2012   OSA on CPAP 12/17/2012   Depression 05/11/2012   Type 2 diabetes mellitus with obesity (HCC) 12/23/2011   Hypertension 12/23/2011   Female pelvic pain 12/23/2011    Past Surgical History:  Procedure Laterality Date   ABDOMINAL HYSTERECTOMY     CHOLECYSTECTOMY     GALLBLADDER SURGERY     REPEAT CESAREAN SECTION      OB History     Gravida  3   Para      Term      Preterm      AB  0   Living  3      SAB  0   IAB  0   Ectopic  0   Multiple  0   Live Births  3            Home Medications    Prior to Admission medications   Medication Sig Start Date End Date Taking? Authorizing Provider  albuterol  (VENTOLIN  HFA) 108 (90 Base) MCG/ACT inhaler Inhale 1-2 puffs into the lungs every 6 (six) hours as needed for wheezing or shortness of breath. 08/13/22   Cayli Escajeda, Jodi R, NP  amLODipine  (NORVASC ) 10 MG tablet Take 1 tablet (10 mg total) by mouth daily. 09/09/21   Christopher Savannah, PA-C  carvedilol (COREG) 6.25 MG tablet Take 6.25 mg by mouth 2 (two) times daily. 05/16/20   [provider]  cyclobenzaprine  (FLEXERIL ) 5 MG tablet Take 1 tablet (5 mg  total) by mouth at bedtime as needed. 02/14/23   Christopher Savannah, PA-C  dicyclomine  (BENTYL ) 20 MG tablet Take 1 tablet (20 mg total) by mouth 2 (two) times daily as needed for spasms (abdominal cramping/spasms). 05/05/23   Shanti Agresti, Jodi R, NP  EPINEPHrine  0.3 mg/0.3 mL IJ SOAJ injection Inject 0.3 mg into the muscle as needed for anaphylaxis. 02/13/22   Suellen Cantor A, PA-C  furosemide  (LASIX ) 20 MG tablet Take 1 tablet (20 mg total) by mouth daily. 05/28/21   Geroldine Berg, MD  gabapentin  (NEURONTIN ) 100 MG capsule Take 1 capsule (100 mg total) by mouth 3 (three) times daily. 10/15/21   Chick Venetia BRAVO, MD  hydrochlorothiazide  (HYDRODIURIL ) 25 MG tablet Take 1 tablet (25 mg total) by mouth daily. 07/21/20   Lorriane Holmes, MD  losartan (COZAAR) 100 MG tablet Take 100 mg by mouth daily.     [provider]  metFORMIN (GLUCOPHAGE-XR) 500 MG 24 hr tablet Take 500 mg by mouth daily. 07/13/21   [provider]  Multiple Vitamin (MULTIVITAMIN WITH MINERALS) TABS tablet Take 1 tablet by mouth daily.    [provider]  naproxen  (NAPROSYN ) 500 MG tablet Take 1 tablet (500 mg total) by mouth 2 (two) times daily with a meal. 01/04/22   Christopher Savannah, PA-C  ondansetron  (ZOFRAN -ODT) 4 MG disintegrating tablet Take 1 tablet (4 mg total) by mouth every 6 (six) hours as needed for nausea or vomiting. 07/06/23   Rising, Asberry, PA-C  topiramate  (TOPAMAX ) 50 MG tablet One or two po qHS 08/24/23   Sater, Charlie LABOR, MD    Family History Family History  Problem Relation Age of Onset   Breast cancer Other    Heart disease Mother    Lupus Maternal Aunt    Stomach cancer Maternal Aunt    Colon cancer Neg Hx    Esophageal cancer Neg Hx    Rectal cancer Neg Hx     Social History Social History   Tobacco Use   Smoking status: Never   Smokeless tobacco: Never  Vaping Use   Vaping status: Never Used  Substance Use Topics   Alcohol use: Never   Drug use: Never     Allergies   Morphine and codeine   Review of Systems Review of Systems  Genitourinary:  Positive for frequency.       Vaginal irritation  Musculoskeletal:  Positive for back pain.     Physical Exam Triage Vital Signs ED Triage Vitals  Encounter Vitals Group     BP 09/08/23 0912 (!) 180/100     Girls Systolic BP Percentile --      Girls Diastolic BP Percentile --      Boys Systolic BP Percentile --      Boys Diastolic BP Percentile --      Pulse Rate 09/08/23 0910 67     Resp 09/08/23 0910 19     Temp 09/08/23 0910 (!) 97.5 F (36.4 C)     Temp Source 09/08/23 0910 Oral     SpO2 09/08/23 0910 96 %     Weight --      Height --      Head Circumference --      Peak Flow --      Pain Score 09/08/23 0909 4     Pain Loc --      Pain Education --      Exclude from Growth Chart --    No  data found.  Updated Vital Signs  BP (!) 180/100   Pulse 67   Temp (!) 97.5 F (36.4 C) (Oral)   Resp 19   LMP 05/05/2019 (Approximate)   SpO2 96%   Visual Acuity Right Eye Distance:   Left Eye Distance:   Bilateral Distance:    Right Eye Near:   Left Eye Near:    Bilateral Near:     Physical Exam Vitals and nursing note reviewed.  Constitutional:      General: She is not in acute distress.    Appearance: Normal appearance. She is obese. She is not ill-appearing, toxic-appearing or diaphoretic.  HENT:     Head: Normocephalic and atraumatic.  Eyes:     Pupils: Pupils are equal, round, and reactive to light.  Cardiovascular:     Rate and Rhythm: Normal rate.  Pulmonary:     Effort: Pulmonary effort is normal.  Abdominal:     Tenderness: There is no right CVA tenderness or left CVA tenderness.  Musculoskeletal:     Lumbar back: Tenderness present. No swelling, edema, signs of trauma, lacerations, spasms or bony tenderness. Negative right straight leg raise test and negative left straight leg raise test.       Back:     Comments: Strength 5 out of 5 bilateral lower extremity  Skin:    General: Skin is warm and dry.  Neurological:     General: No focal deficit present.     Mental Status: She is alert and oriented to person, place, and time.  Psychiatric:        Mood and Affect: Mood normal.        Behavior: Behavior normal.      UC Treatments / Results  Labs (all labs ordered are listed, but only abnormal results are displayed) Labs Reviewed  POCT URINALYSIS DIP (MANUAL ENTRY) - Abnormal; Notable for the following components:      Result Value   Glucose, UA =100 (*)    Protein Ur, POC >=300 (*)    All other components within normal limits  CERVICOVAGINAL ANCILLARY ONLY    EKG   Radiology No results found.  Procedures Procedures (including critical care time)  Medications Ordered in UC Medications - No data to display  Initial Impression /  Assessment and Plan / UC Course  I have reviewed the triage vital signs and the nursing notes.  Pertinent labs & imaging results that were available during my care of the patient were reviewed by me and considered in my medical decision making (see chart for details).     Reviewed exam and symptoms with patient.  Urine negative for UTI.  Vaginal swab was ordered we will contact for any positive results.  Patient declined STD testing.  Discussed symptoms per patient report is likely secondary to her changing detergents.  Advised her to switch back to her previous detergent that she has used and we will await vaginal swab prior to initiating any treatment.  Discussed patient blood pressure in clinic.  She has not taken any of her blood pressure medications today.  Advised her to take her normal BP meds as soon as she gets home and to check her blood pressure frequently to make sure it improves.  She does currently deny any headache, chest pain, shortness of breath, dizziness, visual changes.  Also advised her to avoid ibuprofen  as this can increase her blood pressure.  She may do heat and Tylenol  as needed for her low back.  Advised her to follow-up with her PCP  in 2 days for recheck.  Strict ER precautions reviewed and patient verbalized understanding. Final Clinical Impressions(s) / UC Diagnoses   Final diagnoses:  Vaginal irritation  Acute bilateral low back pain without sciatica  Primary hypertension     Discharge Instructions      The clinic will contact you with results of the vaginal swab done today if positive.  Please take all of your normal blood pressure medications as soon as you get home as your blood pressure is elevated in the clinic.  Avoid taking over-the-counter ibuprofen  or Aleve  as this can cause your blood pressure to increase.  You may take Tylenol  as needed.  You may do heat to your lower back as needed.  Lots of rest.  Please follow-up with your PCP in 2 days for recheck.   Please go to the ER if you develop any worsening symptoms.  Hope you feel better soon!    ED Prescriptions   None    PDMP not reviewed this encounter.   Loreda Myla SAUNDERS, NP 09/08/23 [phone]

## 2023-09-08 NOTE — Discharge Instructions (Addendum)
 The clinic will contact you with results of the vaginal swab done today if positive.  Please take all of your normal blood pressure medications as soon as you get home as your blood pressure is elevated in the clinic.  Avoid taking over-the-counter ibuprofen  or Aleve  as this can cause your blood pressure to increase.  You may take Tylenol  as needed.  You may do heat to your lower back as needed.  Lots of rest.  Please follow-up with your PCP in 2 days for recheck.  Please go to the ER if you develop any worsening symptoms.  Hope you feel better soon!

## 2023-09-08 NOTE — ED Triage Notes (Signed)
 Pt present with c/o lower back pain and vaginal irritation x one week. States she recently laundry detergents. Pt states she has vaginal itching and urinary frequency.

## 2023-09-11 LAB — CERVICOVAGINAL ANCILLARY ONLY
Bacterial Vaginitis (gardnerella): NEGATIVE
Candida Glabrata: NEGATIVE
Candida Vaginitis: NEGATIVE
Comment: NEGATIVE
Comment: NEGATIVE
Comment: NEGATIVE

## 2023-09-11 NOTE — ED Notes (Signed)
 Front staff added as an error when pt called.

## 2023-09-14 ENCOUNTER — Other Ambulatory Visit

## 2023-09-22 ENCOUNTER — Ambulatory Visit: Payer: Self-pay

## 2023-09-29 ENCOUNTER — Ambulatory Visit: Payer: Self-pay

## 2023-11-01 ENCOUNTER — Ambulatory Visit

## 2023-12-06 ENCOUNTER — Encounter: Payer: Self-pay | Admitting: Hematology and Oncology

## 2023-12-07 ENCOUNTER — Ambulatory Visit
Admission: RE | Admit: 2023-12-07 | Discharge: 2023-12-07 | Disposition: A | Discharge status: Home / Self Care | Payer: Self-pay | Source: Ambulatory Visit | Attending: Family Medicine | Admitting: Family Medicine

## 2023-12-07 VITALS — BP 167/101 | HR 67 | Temp 99.0°F | Resp 16

## 2023-12-07 DIAGNOSIS — L304 Erythema intertrigo: Secondary | ICD-10-CM

## 2023-12-07 DIAGNOSIS — N898 Other specified noninflammatory disorders of vagina: Secondary | ICD-10-CM

## 2023-12-07 DIAGNOSIS — R350 Frequency of micturition: Secondary | ICD-10-CM

## 2023-12-07 LAB — POCT URINE DIPSTICK
Bilirubin, UA: NEGATIVE
Blood, UA: NEGATIVE
Glucose, UA: >=1000 mg/dL — AB
Ketones, POC UA: NEGATIVE mg/dL
Leukocytes, UA: NEGATIVE
Nitrite, UA: NEGATIVE
POC PROTEIN,UA: 100 — AB
Spec Grav, UA: 1.025 (ref 1.010–1.025)
Urobilinogen, UA: 0.2 U/dL
pH, UA: 6.5 (ref 5.0–8.0)

## 2023-12-07 MED ORDER — KETOCONAZOLE 2 % EX CREA
1.0000 | TOPICAL_CREAM | Freq: Every day | CUTANEOUS | 0 refills | Status: AC
Start: 2023-12-07 — End: 2023-12-21

## 2023-12-07 NOTE — ED Triage Notes (Signed)
 Pt present with c/o heating up at night. Pt states she feels her body is trying to fight something. Pt state she has a rash under her lt breast , vaginal discharge and urinary frequency x three weeks.

## 2023-12-07 NOTE — Discharge Instructions (Addendum)
 The clinic will contact you with results of the testing done today if positive.  Start ketoconazole topical antifungal cream to the area under your breast daily for 14 days.  Keep the areas clean and dry as possible.  Follow-up with your PCP if your symptoms do not improve.  Please go to the ER for any worsening symptoms.  Hope you feel better soon exclamation

## 2023-12-07 NOTE — ED Provider Notes (Signed)
 [facility]    CSN: [id number] Arrival date & time: 12/07/23  0902      History   Chief Complaint Chief Complaint  Patient presents with   Abdominal Pain    UTI - Entered by patient    HPI Elaine Perez is a 38 y.o. female presents for vaginal discharge.  Patient reports 3 weeks of a non-malodorous intermittently pruritic vaginal discharge.  She also endorses urinary frequency with urgency but denies burning with urination, hematuria, nausea/vomiting or flank pain.  Does endorse feeling hot intermittently but no documented fevers.  No known STD exposure but would like screening.  In addition she reports a burning/itchy rash under the left breast for 2 to 3 weeks.  Slight drainage but again no fevers or chills.  No history of eczema or psoriasis.  No new contacts including soaps, medications, lotions, detergents, excetra.  She used a over-the-counter medicated powder to the area without improvement.  No other concerns at this time.   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 07/04/2019   Iron deficiency anemia due to chronic blood loss 08/31/2018   Iron deficiency anemia 04/27/2018   Leiomyoma of uterus, unspecified 06/15/2016   Anemia, deficiency 05/13/2015   DDD (degenerative disc disease), lumbar 05/16/2013   BMI 40.0-44.9, adult (HCC) 12/17/2012   OSA on CPAP 12/17/2012   Depression 05/11/2012   Type 2 diabetes mellitus with obesity 12/23/2011    Hypertension 12/23/2011   Female pelvic pain 12/23/2011    Past Surgical History:  Procedure Laterality Date   ABDOMINAL HYSTERECTOMY     CHOLECYSTECTOMY     GALLBLADDER SURGERY     REPEAT CESAREAN SECTION      OB History     Gravida  3   Para      Term      Preterm      AB  0   Living  3      SAB  0   IAB  0   Ectopic  0   Multiple  0   Live Births  3            Home Medications    Prior to Admission medications   Medication Sig Start Date End Date Taking? Authorizing Provider  ketoconazole (NIZORAL) 2 % cream Apply 1 Application topically daily for 14 days. 12/07/23 12/21/23 Yes Despina Boan, Jodi R, NP  albuterol  (VENTOLIN  HFA) 108 (90 Base) MCG/ACT inhaler Inhale 1-2 puffs into the lungs every 6 (six) hours as needed for wheezing or shortness of breath. 08/13/22   Annisa Mazzarella, Jodi R, NP  amLODipine  (NORVASC ) 10 MG tablet Take 1 tablet (10 mg total) by mouth daily. 09/09/21   Christopher Savannah, PA-C  carvedilol (COREG) 6.25 MG tablet Take 6.25 mg by mouth 2 (two) times daily. 05/16/20   [provider]  cyclobenzaprine  (FLEXERIL ) 5 MG tablet Take 1 tablet (5 mg total) by mouth at bedtime as needed. 02/14/23  Christopher Savannah, PA-C  dicyclomine  (BENTYL ) 20 MG tablet Take 1 tablet (20 mg total) by mouth 2 (two) times daily as needed for spasms (abdominal cramping/spasms). 05/05/23   Baylei Siebels, Jodi R, NP  EPINEPHrine  0.3 mg/0.3 mL IJ SOAJ injection Inject 0.3 mg into the muscle as needed for anaphylaxis. 02/13/22   Suellen Cantor A, PA-C  furosemide  (LASIX ) 20 MG tablet Take 1 tablet (20 mg total) by mouth daily. 05/28/21   Geroldine Berg, MD  gabapentin  (NEURONTIN ) 100 MG capsule Take 1 capsule (100 mg total) by mouth 3 (three) times daily. 10/15/21   Chick Venetia BRAVO, MD  hydrochlorothiazide  (HYDRODIURIL ) 25 MG tablet Take 1 tablet (25 mg total) by mouth daily. 07/21/20   Lorriane Holmes, MD  losartan (COZAAR) 100 MG tablet Take 100 mg by mouth daily.    [provider]   metFORMIN (GLUCOPHAGE-XR) 500 MG 24 hr tablet Take 500 mg by mouth daily. 07/13/21   [provider]  Multiple Vitamin (MULTIVITAMIN WITH MINERALS) TABS tablet Take 1 tablet by mouth daily.    [provider]  naproxen  (NAPROSYN ) 500 MG tablet Take 1 tablet (500 mg total) by mouth 2 (two) times daily with a meal. 01/04/22   Christopher Savannah, PA-C  ondansetron  (ZOFRAN -ODT) 4 MG disintegrating tablet Take 1 tablet (4 mg total) by mouth every 6 (six) hours as needed for nausea or vomiting. 07/06/23   Rising, Asberry, PA-C  topiramate  (TOPAMAX ) 50 MG tablet One or two po qHS 08/24/23   Sater, Charlie LABOR, MD    Family History Family History  Problem Relation Age of Onset   Breast cancer Other    Heart disease Mother    Lupus Maternal Aunt    Stomach cancer Maternal Aunt    Colon cancer Neg Hx    Esophageal cancer Neg Hx    Rectal cancer Neg Hx     Social History Social History   Tobacco Use   Smoking status: Never   Smokeless tobacco: Never  Vaping Use   Vaping status: Never Used  Substance Use Topics   Alcohol use: Never   Drug use: Never     Allergies   Morphine and codeine   Review of Systems Review of Systems  Genitourinary:  Positive for frequency and vaginal discharge.  Skin:  Positive for rash.     Physical Exam Triage Vital Signs ED Triage Vitals  Encounter Vitals Group     BP 12/07/23 0915 (!) 167/101     Girls Systolic BP Percentile --      Girls Diastolic BP Percentile --      Boys Systolic BP Percentile --      Boys Diastolic BP Percentile --      Pulse Rate 12/07/23 0915 67     Resp 12/07/23 0915 16     Temp 12/07/23 0915 99 F (37.2 C)     Temp Source 12/07/23 0915 Oral     SpO2 12/07/23 0915 95 %     Weight --      Height --      Head Circumference --      Peak Flow --      Pain Score 12/07/23 0914 0     Pain Loc --      Pain Education --      Exclude from Growth Chart --    No data found.  Updated Vital Signs BP (!) 167/101    Pulse 67   Temp 99 F (37.2 C) (Oral)  Resp 16   LMP 05/05/2019 (Approximate)   SpO2 95%   Visual Acuity Right Eye Distance:   Left Eye Distance:   Bilateral Distance:    Right Eye Near:   Left Eye Near:    Bilateral Near:     Physical Exam Vitals and nursing note reviewed.  Constitutional:      General: She is not in acute distress.    Appearance: Normal appearance. She is not ill-appearing.  HENT:     Head: Normocephalic and atraumatic.  Eyes:     Pupils: Pupils are equal, round, and reactive to light.  Cardiovascular:     Rate and Rhythm: Normal rate.  Pulmonary:     Effort: Pulmonary effort is normal.  Skin:    General: Skin is warm and dry.     Comments: There is a moist erythematous homogenous rash under the left breast with intermittent hypopigmentation  Neurological:     General: No focal deficit present.     Mental Status: She is alert and oriented to person, place, and time.  Psychiatric:        Mood and Affect: Mood normal.        Behavior: Behavior normal.      UC Treatments / Results  Labs (all labs ordered are listed, but only abnormal results are displayed) Labs Reviewed  POCT URINE DIPSTICK - Abnormal; Notable for the following components:      Result Value   Glucose, UA >=1,000 (*)    POC PROTEIN,UA =100 (*)    All other components within normal limits  RPR  HIV ANTIBODY (ROUTINE TESTING W REFLEX)  CERVICOVAGINAL ANCILLARY ONLY    EKG   Radiology No results found.  Procedures Procedures (including critical care time)  Medications Ordered in UC Medications - No data to display  Initial Impression / Assessment and Plan / UC Course  I have reviewed the triage vital signs and the nursing notes.  Pertinent labs & imaging results that were available during my care of the patient were reviewed by me and considered in my medical decision making (see chart for details).     Reviewed exam and symptoms with patient.  No red flags.   Urine negative for UTI.  Vaginal swab/STD testing is ordered and will contact for any positive results.  Will await results prior to initiating any treatment.  Discussed intertrigo will start topical antifungal cream daily and advised keep area clean and dry and avoid friction to the area.  PCP follow-up if symptoms do not improve.  ER precautions reviewed. Final Clinical Impressions(s) / UC Diagnoses   Final diagnoses:  Urinary frequency  Vaginal discharge  Intertrigo     Discharge Instructions      The clinic will contact you with results of the testing done today if positive.  Start ketoconazole topical antifungal cream to the area under your breast daily for 14 days.  Keep the areas clean and dry as possible.  Follow-up with your PCP if your symptoms do not improve.  Please go to the ER for any worsening symptoms.  Hope you feel better soon exclamation    ED Prescriptions     Medication Sig Dispense Auth. Provider   ketoconazole (NIZORAL) 2 % cream Apply 1 Application topically daily for 14 days. 60 g Silverio Hagan, Jodi R, NP      PDMP not reviewed this encounter.   Loreda Myla SAUNDERS, NP 12/07/23 [phone]

## 2023-12-08 LAB — CERVICOVAGINAL ANCILLARY ONLY
Bacterial Vaginitis (gardnerella): NEGATIVE
Candida Glabrata: NEGATIVE
Candida Vaginitis: POSITIVE — AB
Chlamydia: NEGATIVE
Comment: NEGATIVE
Comment: NEGATIVE
Comment: NEGATIVE
Comment: NEGATIVE
Comment: NEGATIVE
Comment: NORMAL
Neisseria Gonorrhea: NEGATIVE
Trichomonas: NEGATIVE

## 2023-12-08 LAB — HIV ANTIBODY (ROUTINE TESTING W REFLEX): HIV Screen 4th Generation wRfx: NONREACTIVE

## 2023-12-08 LAB — RPR: RPR Ser Ql: NONREACTIVE

## 2023-12-11 ENCOUNTER — Ambulatory Visit (HOSPITAL_COMMUNITY): Payer: Self-pay

## 2023-12-11 MED ORDER — FLUCONAZOLE 150 MG PO TABS: 150.0000 mg | ORAL_TABLET | Freq: Once | ORAL | 0 refills | Status: AC

## 2023-12-22 ENCOUNTER — Encounter: Payer: Self-pay | Admitting: Hematology and Oncology

## 2024-02-01 ENCOUNTER — Ambulatory Visit: Payer: Self-pay

## 2024-02-12 ENCOUNTER — Ambulatory Visit

## 2024-02-21 ENCOUNTER — Ambulatory Visit: Payer: Self-pay

## 2024-02-23 ENCOUNTER — Ambulatory Visit
Admission: RE | Admit: 2024-02-23 | Discharge: 2024-02-23 | Disposition: A | Discharge status: Home / Self Care | Source: Ambulatory Visit | Attending: Nurse Practitioner

## 2024-02-23 VITALS — BP 157/116 | HR 87 | Temp 99.1°F | Resp 17

## 2024-02-23 DIAGNOSIS — M5412 Radiculopathy, cervical region: Secondary | ICD-10-CM | POA: Insufficient documentation

## 2024-02-23 DIAGNOSIS — N898 Other specified noninflammatory disorders of vagina: Secondary | ICD-10-CM | POA: Diagnosis present

## 2024-02-23 DIAGNOSIS — N760 Acute vaginitis: Secondary | ICD-10-CM | POA: Insufficient documentation

## 2024-02-23 MED ORDER — FLUCONAZOLE 150 MG PO TABS: 150.0000 mg | ORAL_TABLET | ORAL | 0 refills | Status: AC

## 2024-02-23 MED ORDER — NAPROXEN 500 MG PO TABS: 500.0000 mg | ORAL_TABLET | Freq: Two times a day (BID) | ORAL | 0 refills | Status: DC

## 2024-02-23 MED ORDER — METHOCARBAMOL 500 MG PO TABS: 500.0000 mg | ORAL_TABLET | Freq: Three times a day (TID) | ORAL | 0 refills | Status: DC | PRN

## 2024-02-23 NOTE — ED Provider Notes (Signed)
 " [facility]    CSN: [id number] Arrival date & time: 02/23/24  1331      History   Chief Complaint Chief Complaint  Patient presents with   Vaginal Discharge    And tight muscles - Entered by patient    HPI Elaine Perez is a 39 y.o. female.   Discussed the use of AI scribe software for clinical note transcription with the patient, who gave verbal consent to proceed.   Elaine Perez is a diabetic patient presenting with vaginal itching for the past couple of days that has improved today, as well as neck/upper back pain and spasms that has been ongoing for the past couple of weeks.   Regarding the vaginal itching, the patient reports it has gotten better today. She noticed a little discharge on a swab she performed in clinic for testing but denies odor or irritation. She does not experience pain or burning with urination. The patient wonders if one of her medications might be contributing to the symptoms, noting that when she is consistent with taking her diabetes medication, she sometimes experiences these issues. She reports her body tends to heat up when fighting off something and experiences low-grade fevers at night. She has had a hysterectomy. Denies any sexual activity.   For the back pain, she describes upper back and neck spasms that have been present for a couple of weeks. She attributes this to working two jobs and suspects it may be work-related. The pain worsens with stretching and certain movements, particularly turning her neck to the left. She has tried using a heat pad without relief. The patient also reports left hand tightness and numbness that began when her the pain started. She is right-handed and can still grab things with her left hand despite the numbness and tightness. She tried elevating herself while sleeping due to hand numbness but this did not help. She has not taken any medications for her symptoms.   The following sections of the patient's history  were reviewed and updated as appropriate: allergies, current medications, past family history, past medical history, past social history, past surgical history, and problem list.       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 07/04/2019   Iron deficiency anemia due to chronic blood loss 08/31/2018   Iron deficiency anemia 04/27/2018   Leiomyoma of uterus, unspecified 06/15/2016   Anemia, deficiency 05/13/2015   DDD (degenerative disc disease), lumbar 05/16/2013   BMI 40.0-44.9, adult (HCC) 12/17/2012   OSA on CPAP 12/17/2012   Depression 05/11/2012   Type 2 diabetes mellitus with obesity 12/23/2011   Hypertension 12/23/2011   Female pelvic pain 12/23/2011    Past Surgical History:  Procedure Laterality Date   ABDOMINAL HYSTERECTOMY     CHOLECYSTECTOMY     GALLBLADDER SURGERY     REPEAT CESAREAN SECTION      OB History     Gravida  3   Para      Term  Preterm      AB  0   Living  3      SAB  0   IAB  0   Ectopic  0   Multiple  0   Live Births  3            Home Medications    Prior to Admission medications  Medication Sig Start Date End Date Taking? Authorizing Provider  fluconazole  (DIFLUCAN ) 150 MG tablet Take 1 tablet (150 mg total) by mouth every 3 (three) days for 2 doses. 02/23/24 02/27/24 Yes Tandy Grawe, FNP  methocarbamol  (ROBAXIN ) 500 MG tablet Take 1 tablet (500 mg total) by mouth every 8 (eight) hours as needed for muscle spasms. 02/23/24  Yes Mehtaab Mayeda, FNP   naproxen  (NAPROSYN ) 500 MG tablet Take 1 tablet (500 mg total) by mouth 2 (two) times daily with a meal. Take with food to avoid stomach upset. Do not take any additional NSAIDs while on this. You may take tylenol  in addition to this if needed for extra pain relief. 02/23/24  Yes Iola Lukes, FNP  albuterol  (VENTOLIN  HFA) 108 (90 Base) MCG/ACT inhaler Inhale 1-2 puffs into the lungs every 6 (six) hours as needed for wheezing or shortness of breath. 08/13/22   Loreda Myla SAUNDERS, NP  amLODipine  (NORVASC ) 10 MG tablet Take 1 tablet (10 mg total) by mouth daily. 09/09/21   Christopher Savannah, PA-C  carvedilol (COREG) 6.25 MG tablet Take 6.25 mg by mouth 2 (two) times daily. 05/16/20   [provider]  dicyclomine  (BENTYL ) 20 MG tablet Take 1 tablet (20 mg total) by mouth 2 (two) times daily as needed for spasms (abdominal cramping/spasms). 05/05/23   Mayer, Jodi R, NP  EPINEPHrine  0.3 mg/0.3 mL IJ SOAJ injection Inject 0.3 mg into the muscle as needed for anaphylaxis. 02/13/22   Suellen Cantor A, PA-C  furosemide  (LASIX ) 20 MG tablet Take 1 tablet (20 mg total) by mouth daily. 05/28/21   Geroldine Berg, MD  gabapentin  (NEURONTIN ) 100 MG capsule Take 1 capsule (100 mg total) by mouth 3 (three) times daily. 10/15/21   Chick Venetia BRAVO, MD  hydrochlorothiazide  (HYDRODIURIL ) 25 MG tablet Take 1 tablet (25 mg total) by mouth daily. 07/21/20   Lorriane Holmes, MD  losartan (COZAAR) 100 MG tablet Take 100 mg by mouth daily.    [provider]  metFORMIN (GLUCOPHAGE-XR) 500 MG 24 hr tablet Take 500 mg by mouth daily. 07/13/21   [provider]  Multiple Vitamin (MULTIVITAMIN WITH MINERALS) TABS tablet Take 1 tablet by mouth daily.    [provider]  ondansetron  (ZOFRAN -ODT) 4 MG disintegrating tablet Take 1 tablet (4 mg total) by mouth every 6 (six) hours as needed for nausea or vomiting. 07/06/23   Rising, Asberry, PA-C  topiramate  (TOPAMAX ) 50 MG tablet One or two po qHS 08/24/23   Sater,  Charlie LABOR, MD    Family History Family History  Problem Relation Age of Onset   Breast cancer Other    Heart disease Mother    Lupus Maternal Aunt    Stomach cancer Maternal Aunt    Colon cancer Neg Hx    Esophageal cancer Neg Hx    Rectal cancer Neg Hx     Social History Social History[1]   Allergies   Morphine and codeine   Review of Systems Review of Systems  Constitutional:  Negative for fever.       Occasional episodes of body just heating up randomly during the day and  sometimes at night   Gastrointestinal:  Negative for abdominal pain, nausea and vomiting.  Genitourinary:  Positive for vaginal discharge. Negative for dysuria.       Vaginal itching. No irritation or odor  Musculoskeletal:  Positive for back pain and neck pain.  Neurological:  Positive for numbness (left hand tightness and numbness). Negative for weakness.  All other systems reviewed and are negative.    Physical Exam Triage Vital Signs ED Triage Vitals  Encounter Vitals Group     BP 02/23/24 1412 (!) 157/116     Girls Systolic BP Percentile --      Girls Diastolic BP Percentile --      Boys Systolic BP Percentile --      Boys Diastolic BP Percentile --      Pulse Rate 02/23/24 1412 87     Resp 02/23/24 1412 17     Temp 02/23/24 1412 99.1 F (37.3 C)     Temp Source 02/23/24 1412 Oral     SpO2 02/23/24 1412 95 %     Weight --      Height --      Head Circumference --      Peak Flow --      Pain Score 02/23/24 1411 0     Pain Loc --      Pain Education --      Exclude from Growth Chart --    No data found.  Updated Vital Signs BP (!) 157/116   Pulse 87   Temp 99.1 F (37.3 C) (Oral)   Resp 17   LMP 05/05/2019   SpO2 95%   Visual Acuity Right Eye Distance:   Left Eye Distance:   Bilateral Distance:    Right Eye Near:   Left Eye Near:    Bilateral Near:     Physical Exam Constitutional:      General: She is not in acute distress.    Appearance: Normal appearance.  She is not ill-appearing, toxic-appearing or diaphoretic.  HENT:     Head: Normocephalic.     Nose: Nose normal.     Mouth/Throat:     Mouth: Mucous membranes are moist.  Eyes:     Conjunctiva/sclera: Conjunctivae normal.  Cardiovascular:     Rate and Rhythm: Normal rate.  Pulmonary:     Effort: Pulmonary effort is normal.  Abdominal:     Palpations: Abdomen is soft.  Genitourinary:    Comments: Deferred; patient performed self-swab for Aptima testing  Musculoskeletal:        General: Normal range of motion.     Cervical back: Normal range of motion and neck supple.  Skin:    General: Skin is warm and dry.  Neurological:     General: No focal deficit present.     Mental Status: She is alert and oriented to person, place, and time.  Psychiatric:        Mood and Affect: Mood normal.        Behavior: Behavior normal.      UC Treatments / Results  Labs (all labs ordered are listed, but only abnormal results are displayed) Labs Reviewed  CERVICOVAGINAL ANCILLARY ONLY    EKG   Radiology No results found.  Procedures Procedures (including critical care time)  Medications Ordered in UC Medications - No data to display  Initial Impression / Assessment and Plan / UC Course  I have reviewed the triage vital signs and the nursing notes.  Pertinent labs & imaging results  that were available during my care of the patient were reviewed by me and considered in my medical decision making (see chart for details).     Patient presents with vaginal itching. Vaginal swabs were collected for gonorrhea, chlamydia, trichomonas, yeast, and bacterial vaginosis testing; results are pending. Empiric treatment initiated with fluconazole  (Diflucan ). Further management will be guided by pending test results. The patient was advised to maintain adequate hydration and to use barrier protection if sexually active to help reduce the risk of recurrent infection. She was informed that she will be  contacted only if results are positive, though all results will be available for review through her MyChart account. She was instructed to follow up with her primary care provider or gynecologist if symptoms do not improve with treatment and to seek medical attention sooner if she develops fever, pelvic pain, or worsening discharge.  The patient also presents with neck and upper back muscle spasms associated with neck pain and intermittent tightness and numbness in the left hand that began at the same time as the pain. She denies any specific injury but reports working two jobs and suspects an occupational or overuse component. Symptoms are worsened by stretching and turning movements, and heat therapy has not provided relief. Physical examination is reassuring, with full strength, intact sensation, and normal range of motion of the neck, cervical spine, and upper extremities, without focal neurologic deficits. The clinical presentation is most consistent with cervical radiculopathy or musculoskeletal strain. Treatment includes naproxen  twice daily with food for pain and inflammation and Robaxin  as needed for muscle spasms. She was advised to alternate ice and heat therapy for symptom relief and to modify activities that exacerbate symptoms. Orthopedic referral information was provided, and she was advised to follow up with orthopedics if symptoms do not improve or worsen. She was instructed to seek urgent or emergency care for new or worsening weakness, progressive numbness, loss of bowel or bladder control, severe or worsening pain, or any other concerning neurologic symptoms.  Today's evaluation has revealed no signs of a dangerous process. Discussed diagnosis with patient and/or guardian. Patient and/or guardian aware of their diagnosis, possible red flag symptoms to watch out for and need for close follow up. Patient and/or guardian understands verbal and written discharge instructions. Patient and/or  guardian comfortable with plan and disposition.  Patient and/or guardian has a clear mental status at this time, good insight into illness (after discussion and teaching) and has clear judgment to make decisions regarding their care  Documentation was completed with the aid of voice recognition software. Transcription may contain typographical errors.   Final Clinical Impressions(s) / UC Diagnoses   Final diagnoses:  Vaginal discharge  Acute cervical radiculopathy  Acute vaginitis     Discharge Instructions      You were seen today for vaginal itching as well as neck and upper back pain with muscle spasms.   Vaginal swabs were collected to test for gonorrhea, chlamydia, trichomonas, yeast, and bacterial vaginosis, and those results are pending. You were started on fluconazole  (Diflucan ) today to treat a possible yeast infection. Drink plenty of fluids. You will only be contacted if a test result is positive or requires follow-up, but all results will be available for you to review in your MyChart account. Follow up with your primary care provider or gynecologist if symptoms do not improve after treatment or if you notice worsening discharge, fever, pelvic pain, or new symptoms.  You were also evaluated for neck and upper back  muscle spasms with associated neck pain and intermittent tightness or numbness in the left hand. Your exam today was reassuring, with normal strength, sensation, and movement. This is most consistent with a muscle strain or cervical nerve irritation. Take naproxen  twice daily with food as prescribed for pain and inflammation, and use Robaxin  as needed for muscle spasms. While taking this medication, do not use over-the-counter anti-inflammatories such as aspirin, Motrin , ibuprofen , or Aleve , as this may increase the risk of side effects. If needed, you may take Tylenol  (acetaminophen ) 1000 mg every six hours for additional pain relief. This equals two 500 mg tablets at a  time. Be careful not to take more than 4000 mg of Tylenol  in a 24-hour period. Alternating ice and heat, gentle activity modification, and avoiding movements that worsen symptoms may help with recovery. An orthopedic referral was provided, and you should follow up with orthopedics if symptoms do not improve or worsen. Go to the emergency department right away if you develop new or worsening weakness, increasing numbness, loss of bowel or bladder control, severe or worsening pain, fever, pelvic pain, or any sudden or concerning change in your condition.      ED Prescriptions     Medication Sig Dispense Auth. Provider   fluconazole  (DIFLUCAN ) 150 MG tablet Take 1 tablet (150 mg total) by mouth every 3 (three) days for 2 doses. 2 tablet Iola Lukes, FNP   naproxen  (NAPROSYN ) 500 MG tablet Take 1 tablet (500 mg total) by mouth 2 (two) times daily with a meal. Take with food to avoid stomach upset. Do not take any additional NSAIDs while on this. You may take tylenol  in addition to this if needed for extra pain relief. 20 tablet Larue Lightner, FNP   methocarbamol  (ROBAXIN ) 500 MG tablet Take 1 tablet (500 mg total) by mouth every 8 (eight) hours as needed for muscle spasms. 21 tablet Iola Lukes, FNP      PDMP not reviewed this encounter.     [1]  Social History Tobacco Use   Smoking status: Never   Smokeless tobacco: Never  Vaping Use   Vaping status: Never Used  Substance Use Topics   Alcohol use: Never   Drug use: Never     Iola Lukes, FNP 02/23/24 1522  "

## 2024-02-23 NOTE — Discharge Instructions (Addendum)
 You were seen today for vaginal itching as well as neck and upper back pain with muscle spasms.   Vaginal swabs were collected to test for gonorrhea, chlamydia, trichomonas, yeast, and bacterial vaginosis, and those results are pending. You were started on fluconazole  (Diflucan ) today to treat a possible yeast infection. Drink plenty of fluids. You will only be contacted if a test result is positive or requires follow-up, but all results will be available for you to review in your MyChart account. Follow up with your primary care provider or gynecologist if symptoms do not improve after treatment or if you notice worsening discharge, fever, pelvic pain, or new symptoms.  You were also evaluated for neck and upper back muscle spasms with associated neck pain and intermittent tightness or numbness in the left hand. Your exam today was reassuring, with normal strength, sensation, and movement. This is most consistent with a muscle strain or cervical nerve irritation. Take naproxen  twice daily with food as prescribed for pain and inflammation, and use Robaxin  as needed for muscle spasms. While taking this medication, do not use over-the-counter anti-inflammatories such as aspirin, Motrin , ibuprofen , or Aleve , as this may increase the risk of side effects. If needed, you may take Tylenol  (acetaminophen ) 1000 mg every six hours for additional pain relief. This equals two 500 mg tablets at a time. Be careful not to take more than 4000 mg of Tylenol  in a 24-hour period. Alternating ice and heat, gentle activity modification, and avoiding movements that worsen symptoms may help with recovery. An orthopedic referral was provided, and you should follow up with orthopedics if symptoms do not improve or worsen. Go to the emergency department right away if you develop new or worsening weakness, increasing numbness, loss of bowel or bladder control, severe or worsening pain, fever, pelvic pain, or any sudden or concerning  change in your condition.

## 2024-02-23 NOTE — ED Triage Notes (Signed)
 Pt present with c/o possible yeast infection. States she vaginal itching x 2 days, today the itching has gotten better.   States she has had spasms on her lower and upper back x 2 weeks. States she has tried stretching more, states it causes more pain.

## 2024-02-26 ENCOUNTER — Ambulatory Visit: Payer: Self-pay | Admitting: Nurse Practitioner

## 2024-02-26 LAB — CERVICOVAGINAL ANCILLARY ONLY
Bacterial Vaginitis (gardnerella): POSITIVE — AB
Candida Glabrata: NEGATIVE
Candida Vaginitis: POSITIVE — AB
Chlamydia: NEGATIVE
Comment: NEGATIVE
Comment: NEGATIVE
Comment: NEGATIVE
Comment: NEGATIVE
Comment: NEGATIVE
Comment: NORMAL
Neisseria Gonorrhea: NEGATIVE
Trichomonas: NEGATIVE

## 2024-02-26 MED ORDER — METRONIDAZOLE 500 MG PO TABS: 500.0000 mg | ORAL_TABLET | Freq: Two times a day (BID) | ORAL | 0 refills | Status: AC

## 2024-02-26 NOTE — Progress Notes (Signed)
 Cervicovaginal testing was positive for bacterial vaginosis and Candida vaginitis. The patient should continue the prescribed fluconazole  (Diflucan ) to treat the yeast infection. Metronidazole  has been prescribed to treat bacterial vaginosis and ensure adequate treatment of the infection and was sent to the patients preferred pharmacy. The patient should be advised to avoid alcohol while taking metronidazole  due to the risk of adverse reactions, including nausea, vomiting, flushing, and headache.

## 2024-03-08 ENCOUNTER — Other Ambulatory Visit: Payer: Self-pay

## 2024-03-08 ENCOUNTER — Encounter (HOSPITAL_BASED_OUTPATIENT_CLINIC_OR_DEPARTMENT_OTHER): Payer: Self-pay | Admitting: Emergency Medicine

## 2024-03-08 ENCOUNTER — Emergency Department (HOSPITAL_BASED_OUTPATIENT_CLINIC_OR_DEPARTMENT_OTHER)
Admission: EM | Admit: 2024-03-08 | Discharge: 2024-03-09 | Disposition: A | Discharge status: Home / Self Care | Attending: Emergency Medicine | Admitting: Emergency Medicine

## 2024-03-08 DIAGNOSIS — M79642 Pain in left hand: Secondary | ICD-10-CM | POA: Diagnosis present

## 2024-03-08 NOTE — ED Triage Notes (Signed)
 Left hand pain and numbness in distal palm and digits 1-4. Pain starting today, numbness for a while. 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 Pt states completed ABX for VB, still having Vaginal  DC.

## 2024-03-09 MED ORDER — MELOXICAM 7.5 MG PO TABS: 7.5000 mg | ORAL_TABLET | Freq: Every day | ORAL | 0 refills | Status: AC

## 2024-03-09 MED ORDER — FLUCONAZOLE 150 MG PO TABS
150.0000 mg | ORAL_TABLET | Freq: Once | ORAL | Status: AC
  Administered 2024-03-09: 150 mg via ORAL
  Filled 2024-03-09: qty 1

## 2024-03-09 MED ORDER — GABAPENTIN 100 MG PO CAPS
100.0000 mg | ORAL_CAPSULE | Freq: Once | ORAL | Status: AC
  Administered 2024-03-09: 100 mg via ORAL
  Filled 2024-03-09: qty 1

## 2024-03-09 MED ORDER — FLUCONAZOLE 200 MG PO TABS: 200.0000 mg | ORAL_TABLET | Freq: Every day | ORAL | 0 refills | Status: AC

## 2024-03-09 MED ORDER — GABAPENTIN 100 MG PO CAPS: 100.0000 mg | ORAL_CAPSULE | Freq: Three times a day (TID) | ORAL | 0 refills | Status: AC

## 2024-03-09 MED ORDER — KETOROLAC TROMETHAMINE 60 MG/2ML IM SOLN
30.0000 mg | Freq: Once | INTRAMUSCULAR | Status: AC
  Administered 2024-03-09: 30 mg via INTRAMUSCULAR
  Filled 2024-03-09: qty 2

## 2024-03-09 NOTE — ED Provider Notes (Signed)
 " [place] [facility] Provider Note   CSN: [id number] Arrival date & time: 03/08/24  2224     Patient presents with: Hand Pain and Vaginal Discharge   Elaine Perez is a 39 y.o. female.   Patient states persistent vaginal discharge after being treated for bacterial vaginosis last time she was here.  On review of the records it appears that she also was positive for a yeast infection.  No pain just discharge.  Patient also states that she has been having some left hand paresthesias and sometimes a sharp shooting pain.  She states it starts in the middle of her palm and if she pushes in the right spot it shoots pain up through her pointer, long and ring finger on her left hand.  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  She never had any of this before.  She has no weakness.  She has no symptoms above the wrist.  She thinks that her hand feels a little bit swollen as well.   Hand Pain  Vaginal Discharge      Prior to Admission medications  Medication Sig Start Date End Date Taking? Authorizing Provider  fluconazole  (DIFLUCAN ) 200 MG tablet Take 1 tablet (200 mg total) by mouth daily. 03/09/24  Yes Abaigeal Moomaw, Selinda, MD  gabapentin  (NEURONTIN ) 100 MG capsule Take 1 capsule (100 mg total) by mouth 3 (three) times daily. 03/09/24  Yes Baley Lorimer, Selinda, MD  meloxicam  (MOBIC ) 7.5 MG tablet Take 1 tablet (7.5 mg total) by mouth daily. 03/09/24  Yes Zoey Gilkeson, Selinda, MD  albuterol  (VENTOLIN  HFA) 108 (90 Base) MCG/ACT inhaler Inhale 1-2 puffs into the lungs every 6 (six) hours as needed for wheezing or shortness of breath. 08/13/22   Mayer, Jodi R, NP  amLODipine  (NORVASC ) 10 MG tablet Take 1 tablet (10 mg total) by mouth daily. 09/09/21   Christopher Savannah, PA-C  carvedilol (COREG) 6.25 MG tablet Take 6.25 mg by mouth 2 (two) times daily. 05/16/20   [provider]  EPINEPHrine  0.3 mg/0.3 mL IJ SOAJ injection Inject 0.3 mg into the muscle as needed for  anaphylaxis. 02/13/22   Suellen Cantor A, PA-C  furosemide  (LASIX ) 20 MG tablet Take 1 tablet (20 mg total) by mouth daily. 05/28/21   Geroldine Berg, MD  hydrochlorothiazide  (HYDRODIURIL ) 25 MG tablet Take 1 tablet (25 mg total) by mouth daily. 07/21/20   Lorriane Holmes, MD  losartan (COZAAR) 100 MG tablet Take 100 mg by mouth daily.    [provider]  metFORMIN (GLUCOPHAGE-XR) 500 MG 24 hr tablet Take 500 mg by mouth daily. 07/13/21   [provider]  Multiple Vitamin (MULTIVITAMIN WITH MINERALS) TABS tablet Take 1 tablet by mouth daily.    [provider]  ondansetron  (ZOFRAN -ODT) 4 MG disintegrating tablet Take 1 tablet (4 mg total) by mouth every 6 (six) hours as needed for nausea or vomiting. 07/06/23   Rising, Asberry, PA-C  topiramate  (TOPAMAX ) 50 MG tablet One or two po qHS 08/24/23   Sater, Charlie LABOR, MD    Allergies: Morphine and codeine    Review of Systems  Genitourinary:  Positive for vaginal discharge.    Updated Vital Signs BP (!) 179/111 (BP Location: Right Arm)   Pulse 80   Temp 98.4 F (36.9 C) (Oral)   Resp 18   Ht 5' 1 (1.549 m)   Wt 102.1 kg   LMP 05/05/2019   SpO2 99%   BMI 42.51 kg/m   Physical  Exam Vitals and nursing note reviewed.  Constitutional:      Appearance: She is well-developed.  HENT:     Head: Normocephalic and atraumatic.  Cardiovascular:     Rate and Rhythm: Normal rate and regular rhythm.  Pulmonary:     Effort: No respiratory distress.     Breath sounds: No stridor.  Abdominal:     General: There is no distension.  Musculoskeletal:     Cervical back: Normal range of motion.     Comments: Ttp of left hand, volar surface just proximal to 3rd MCP joint, no erythema, fluctuance, warmth  Skin:    General: Skin is warm and dry.  Neurological:     Mental Status: She is alert.     (all labs ordered are listed, but only abnormal results are displayed) Labs Reviewed - No data to  display  EKG: None  Radiology: No results found.   Procedures   Medications Ordered in the ED  fluconazole  (DIFLUCAN ) tablet 150 mg (150 mg Oral Given 03/09/24 0008)  gabapentin  (NEURONTIN ) capsule 100 mg (100 mg Oral Given 03/09/24 0009)  ketorolac  (TORADOL ) injection 30 mg (30 mg Intramuscular Given 03/09/24 0009)                                    Medical Decision Making Risk Prescription drug management.   Pain certainly sounds neurolgic in nature. Possibly neuroma? Will treat with rest, splint, neurontin , NSAID and PCP follow up for possible hand surgery referral if not improving.    Final diagnoses:  Left hand pain    ED Discharge Orders          Ordered    fluconazole  (DIFLUCAN ) 200 MG tablet  Daily        03/09/24 0001    gabapentin  (NEURONTIN ) 100 MG capsule  3 times daily        03/09/24 0001    meloxicam  (MOBIC ) 7.5 MG tablet  Daily        03/09/24 0001               Antonina Deziel, Selinda, MD 03/09/24 [phone]  "

## 2024-03-18 ENCOUNTER — Encounter: Payer: Self-pay | Admitting: Hematology and Oncology

## 2024-03-20 NOTE — Progress Notes (Unsigned)
 "   Patient: Elaine Perez Date of Birth: 10/06/1985  Reason for Visit: Follow up History from: Patient Primary Neurologist: Sater   ASSESSMENT AND PLAN 39 y.o. year old female    HISTORY OF PRESENT ILLNESS: Today 03/20/24  HISTORY  I had the pleasure of seeing your patient, Elaine Perez, at [facility] for neurologic consultation regarding her partially empty sella turcica.   She is a 39 year old woman with HTN and morbid obesity who has several years of a pressure sensation and headache off/on.   Not currently having severe headaches but mild pressure headaches most mornings.   Sometimes her vision is blurry.   She notes pulsatile tinnitus.  Sometimes she gets presyncope upon standing up.    She does report an eye exam with dilation in the past year.      She is also noting tingling in her fingertips in both hands.    It is annoying more than painful.    She denies weakness in her limbs.  Tingling sometimes awakens her and shaking hands will help.       She is walking well and no falls.  She has no difficulty with stairs.       She was on Ozempic but had side effects with nausea so stopped.   She lost 20 pounds but has gained it all back.     REVIEW OF SYSTEMS: Out of a complete 14 system review of symptoms, the patient complains only of the following symptoms, and all other reviewed systems are negative.  See HPI  ALLERGIES: Allergies[1]  HOME MEDICATIONS: Outpatient Medications Prior to Visit  Medication Sig Dispense Refill   albuterol  (VENTOLIN  HFA) 108 (90 Base) MCG/ACT inhaler Inhale 1-2 puffs into the lungs every 6 (six) hours as needed for wheezing or shortness of breath. 1 each 0   amLODipine  (NORVASC ) 10 MG tablet Take 1 tablet (10 mg total) by mouth daily. 90 tablet 0   carvedilol (COREG) 6.25 MG tablet Take 6.25 mg by mouth 2 (two) times daily.     EPINEPHrine  0.3 mg/0.3 mL IJ SOAJ injection Inject 0.3 mg into the muscle as needed for  anaphylaxis. 2 each 0   fluconazole  (DIFLUCAN ) 200 MG tablet Take 1 tablet (200 mg total) by mouth daily. 7 tablet 0   furosemide  (LASIX ) 20 MG tablet Take 1 tablet (20 mg total) by mouth daily. 30 tablet 0   gabapentin  (NEURONTIN ) 100 MG capsule Take 1 capsule (100 mg total) by mouth 3 (three) times daily. 60 capsule 0   hydrochlorothiazide  (HYDRODIURIL ) 25 MG tablet Take 1 tablet (25 mg total) by mouth daily. 30 tablet 1   losartan (COZAAR) 100 MG tablet Take 100 mg by mouth daily.     meloxicam  (MOBIC ) 7.5 MG tablet Take 1 tablet (7.5 mg total) by mouth daily. 10 tablet 0   metFORMIN (GLUCOPHAGE-XR) 500 MG 24 hr tablet Take 500 mg by mouth daily.     Multiple Vitamin (MULTIVITAMIN WITH MINERALS) TABS tablet Take 1 tablet by mouth daily.     ondansetron  (ZOFRAN -ODT) 4 MG disintegrating tablet Take 1 tablet (4 mg total) by mouth every 6 (six) hours as needed for nausea or vomiting. 20 tablet 0   topiramate  (TOPAMAX ) 50 MG tablet One or two po qHS 60 tablet 5   No facility-administered medications prior to visit.    PAST MEDICAL HISTORY: Past Medical History:  Diagnosis Date   Asthma    Blood transfusion without reported diagnosis 2019  Diabetes mellitus without complication (HCC)    Fibroids    Hypertension    Iron deficiency anemia 04/27/2018   Sleep apnea    Does not use CPAP    PAST SURGICAL HISTORY: Past Surgical History:  Procedure Laterality Date   ABDOMINAL HYSTERECTOMY     CHOLECYSTECTOMY     GALLBLADDER SURGERY     REPEAT CESAREAN SECTION      FAMILY HISTORY: Family History  Problem Relation Age of Onset   Breast cancer Other    Heart disease Mother    Lupus Maternal Aunt    Stomach cancer Maternal Aunt    Colon cancer Neg Hx    Esophageal cancer Neg Hx    Rectal cancer Neg Hx     SOCIAL HISTORY: Social History   Socioeconomic History   Marital status: Married    Spouse name: Not on file   Number of children: 3   Years of education: Not on file    Highest education level: Not on file  Occupational History   Not on file  Tobacco Use   Smoking status: Never   Smokeless tobacco: Never  Vaping Use   Vaping status: Never Used  Substance and Sexual Activity   Alcohol use: Never   Drug use: Never   Sexual activity: Not Currently    Birth control/protection: Surgical  Other Topics Concern   Not on file  Social History Narrative   ** Merged History Encounter **       Social Drivers of Health   Tobacco Use: Low Risk (03/14/2024)   Received from [facility]   Patient History    Smoking Tobacco Use: Never    Smokeless Tobacco Use: Never    Passive Exposure: Not on file  Financial Resource Strain: Medium Risk (04/13/2023)   [facility]   Overall Financial Resource Strain (CARDIA)    Difficulty of Paying Living Expenses: Somewhat hard  Food Insecurity: No Food Insecurity (04/13/2023)   Received from [facility]   Epic    Within the past 12 months, you worried that your food would run out before you got the money to buy more.: Never true    Within the past 12 months, the food you bought just didn't last and you didn't have money to get more.: Never true  Transportation Needs: No Transportation Needs (04/13/2023)   Received from [facility] - Transportation    In the past 12 months, has lack of transportation kept you from medical appointments or from getting medications?: No    Lack of Transportation (Non-Medical): No  Physical Activity: Sufficiently Active (10/05/2021)   Received from [facility]   Exercise Vital Sign    On average, how many days per week do you engage in moderate to strenuous exercise (like a brisk walk)?: 7 days    On average, how many minutes do you engage in exercise at this level?: 30 min  Stress: Stress Concern Present (10/05/2021)   Received from [facility] - Occupational Stress Questionnaire    Feeling of Stress : Very much  Social Connections: Unknown (11/06/2022)   Received from [facility]   Social Network    Social Network: Not on file  Intimate Partner Violence: Unknown (11/06/2022)   Received from [facility]   HITS    Physically Hurt: Not on file    Insult or Talk Down  To: Not on file    Threaten Physical Harm: Not on file    Scream or Curse: Not on file  Depression (EYV7-0): Not on file  Alcohol Screen: Not on file  Housing: Not on file  Utilities: Not At Risk (04/13/2023)   Received from [facility] Utilities    Threatened with loss of utilities: No  Health Literacy: Not on file    PHYSICAL EXAM  There were no vitals filed for this visit. There is no height or weight on file to calculate BMI.  Generalized: Well developed, in no acute distress  Neurological examination  Mentation: Alert oriented to time, place, history taking. Follows all commands speech and language fluent Cranial nerve II-XII: Pupils were equal round reactive to light. Extraocular movements were full, visual field were full on confrontational test. Facial sensation and strength were normal. Uvula tongue midline. Head turning and shoulder shrug  were normal and symmetric. Motor: The motor testing reveals 5 over 5 strength of all 4 extremities. Good symmetric motor tone is noted throughout.  Sensory: Sensory testing is intact to soft touch on all 4 extremities. No evidence of extinction is noted.  Coordination: Cerebellar testing reveals good finger-nose-finger and heel-to-shin bilaterally.  Gait and station: Gait is normal. Tandem gait is normal. Romberg is negative. No drift is seen.  Reflexes: Deep tendon reflexes are symmetric and normal bilaterally.   DIAGNOSTIC DATA (LABS, IMAGING, TESTING) - I reviewed patient records, labs, notes, testing and imaging myself where available.  Lab Results  Component Value Date    WBC 8.1 04/14/2023   HGB 14.1 04/14/2023   HCT 42.8 04/14/2023   MCV 88.4 04/14/2023   PLT 269 04/14/2023      Component Value Date/Time   NA 136 04/14/2023 2009   K 3.8 04/14/2023 2009   CL 102 04/14/2023 2009   CO2 25 04/14/2023 2009   GLUCOSE 284 (H) 04/14/2023 2009   BUN 14 04/14/2023 2009   CREATININE 0.76 04/14/2023 2009   CREATININE 0.85 09/06/2019 0956   CALCIUM 9.0 04/14/2023 2009   PROT 7.2 12/04/2022 1446   ALBUMIN 4.2 12/04/2022 1446   AST 14 (L) 12/04/2022 1446   AST 21 09/06/2019 0956   ALT 24 12/04/2022 1446   ALT 25 09/06/2019 0956   ALKPHOS 68 12/04/2022 1446   BILITOT 0.3 12/04/2022 1446   BILITOT <0.2 (L) 09/06/2019 0956   GFRNONAA >60 04/14/2023 2009   GFRNONAA >60 09/06/2019 0956   GFRAA >60 09/11/2019 0749   GFRAA >60 09/06/2019 0956   No results found for: CHOL, HDL, LDLCALC, LDLDIRECT, TRIG, CHOLHDL No results found for: YHAJ8R Lab Results  Component Value Date   VITAMINB12 232 04/27/2018   No results found for: TSH  Lauraine Born, AGNP-C, DNP 03/20/2024, 1:33 PM [facility] [address] [place], [place] [address] [phone]      [1]  Allergies Allergen Reactions   Morphine And Codeine Itching and Rash    swelling swelling swelling swelling    "

## 2024-03-21 ENCOUNTER — Ambulatory Visit: Admitting: Neurology

## 2024-03-21 ENCOUNTER — Encounter: Payer: Self-pay | Admitting: Neurology

## 2024-03-21 ENCOUNTER — Ambulatory Visit: Admitting: Family Medicine
